# Patient Record
Sex: Female | Born: 1945 | Race: White | Hispanic: No | Marital: Married | Smoking: Never smoker
Health system: Southern US, Community
[De-identification: ages and names within clinical notes are randomized; demographics above are authoritative.]

## PROBLEM LIST (undated history)

## (undated) DIAGNOSIS — J45909 Unspecified asthma, uncomplicated: Secondary | ICD-10-CM

## (undated) HISTORY — PX: PARTIAL HYSTERECTOMY: SHX80

## (undated) HISTORY — PX: TEMPORAL ARTERY BIOPSY / LIGATION: SUR132

---

## 2004-08-09 ENCOUNTER — Emergency Department: Payer: Self-pay | Admitting: Emergency Medicine

## 2005-04-25 ENCOUNTER — Ambulatory Visit: Payer: Self-pay | Admitting: Family Medicine

## 2005-07-17 ENCOUNTER — Emergency Department: Payer: Self-pay | Admitting: General Practice

## 2005-07-17 ENCOUNTER — Other Ambulatory Visit: Payer: Self-pay

## 2005-11-15 ENCOUNTER — Emergency Department: Payer: Self-pay | Admitting: Emergency Medicine

## 2006-03-27 ENCOUNTER — Emergency Department: Payer: Self-pay | Admitting: Emergency Medicine

## 2008-02-26 ENCOUNTER — Emergency Department: Payer: Self-pay | Admitting: Unknown Physician Specialty

## 2008-08-13 ENCOUNTER — Inpatient Hospital Stay: Payer: Self-pay | Admitting: Internal Medicine

## 2009-01-22 ENCOUNTER — Ambulatory Visit: Payer: Self-pay

## 2009-01-24 ENCOUNTER — Ambulatory Visit: Payer: Self-pay

## 2009-01-24 ENCOUNTER — Observation Stay: Payer: Self-pay | Admitting: Otolaryngology

## 2009-04-22 ENCOUNTER — Ambulatory Visit: Payer: Self-pay | Admitting: Otolaryngology

## 2009-07-02 ENCOUNTER — Ambulatory Visit: Payer: Self-pay | Admitting: Family Medicine

## 2009-07-22 IMAGING — CR DG ELBOW COMPLETE 3+V*L*
1 series · 4 of 4 positions shown · non-contrast
Comparison: none

REASON FOR EXAM: pain
COMMENTS:

PROCEDURE:     DXR - DXR ELBOW LT COMP W/OBLIQUES  - August 13, 2008  [DATE]
RESULT:     No fracture, dislocation or other acute bony abnormality is
identified. No elevation of the distal humeral fat pads is seen.

[Series 1: view not recorded · 0.17mm/px · 4 of 4 slices shown]
[im 1/4]
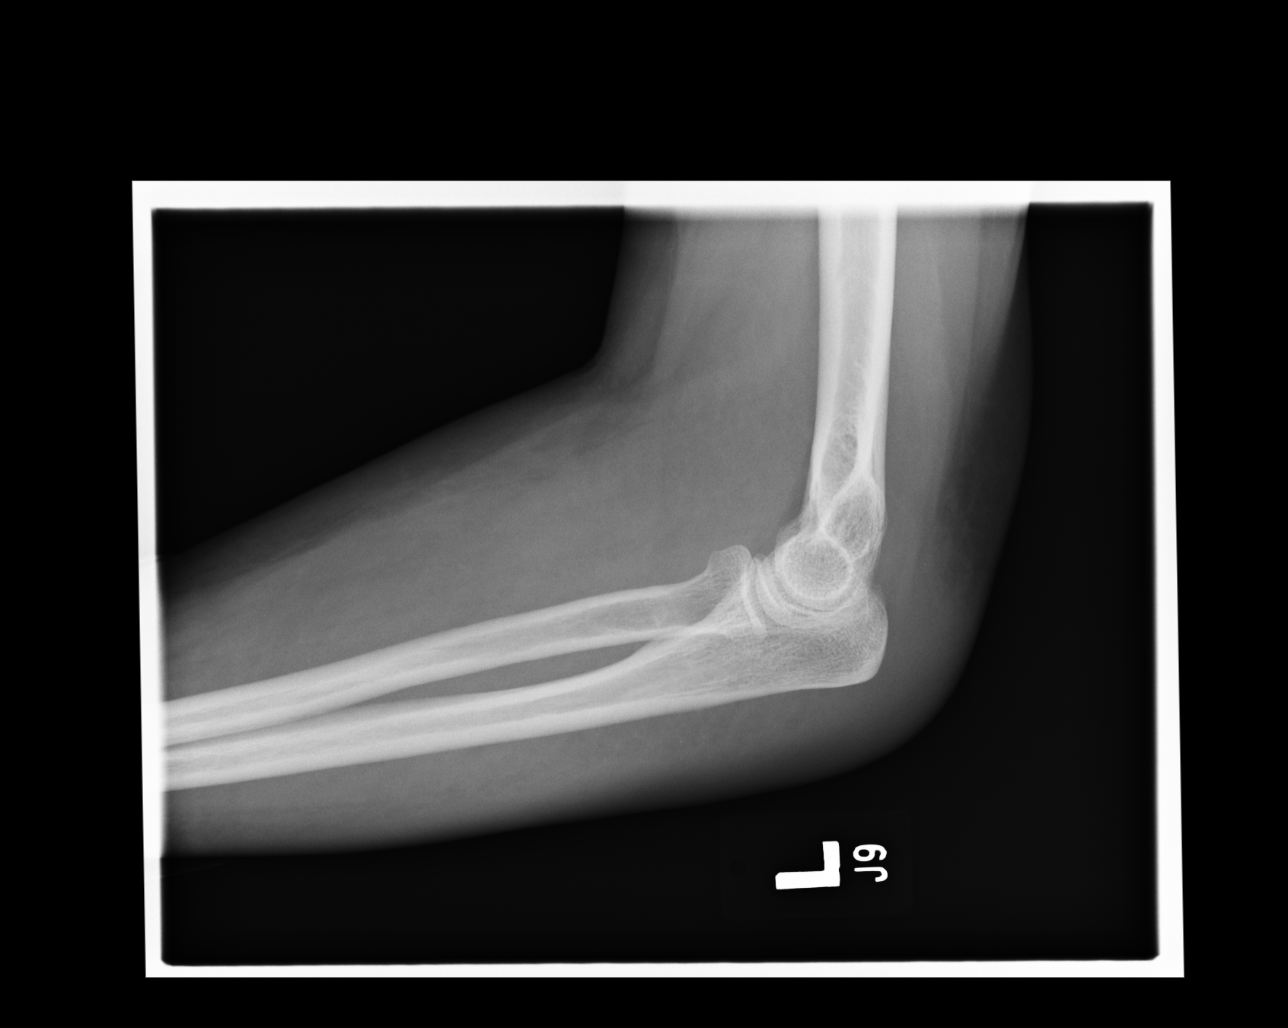
[im 2/4]
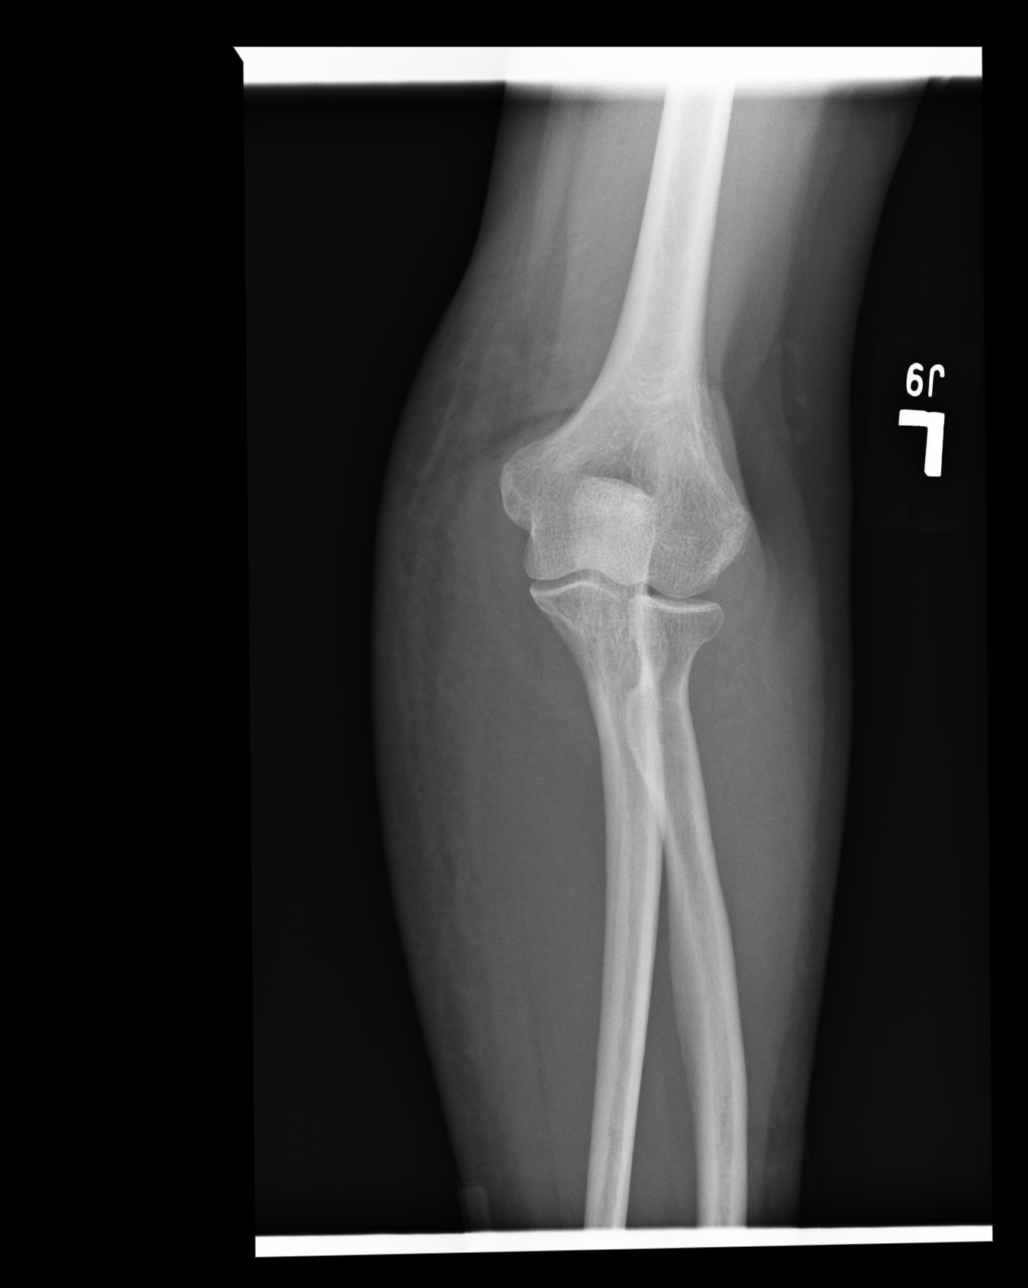
[im 3/4]
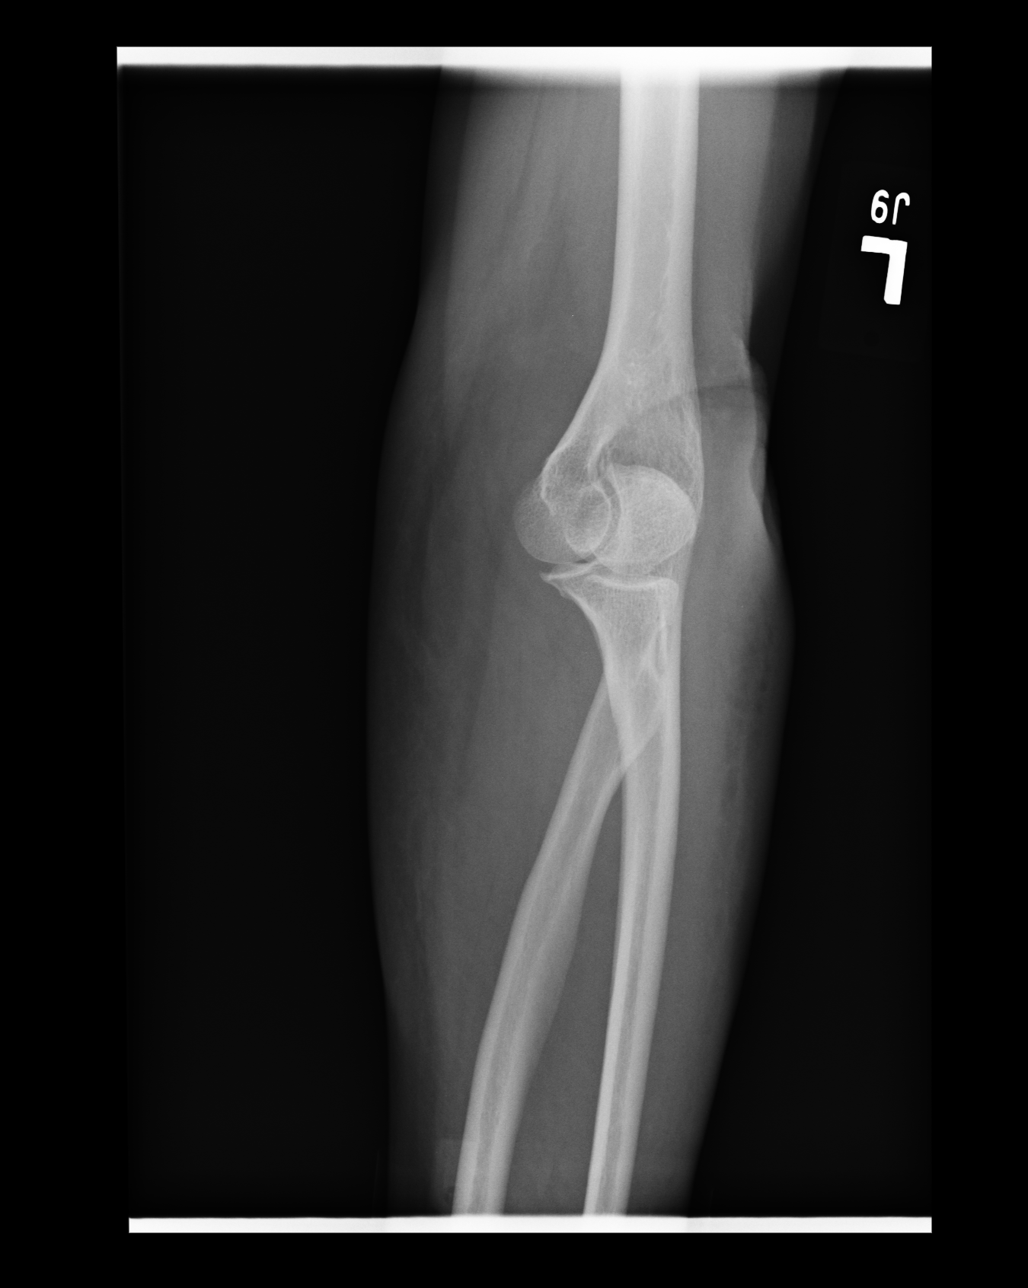
[im 4/4]
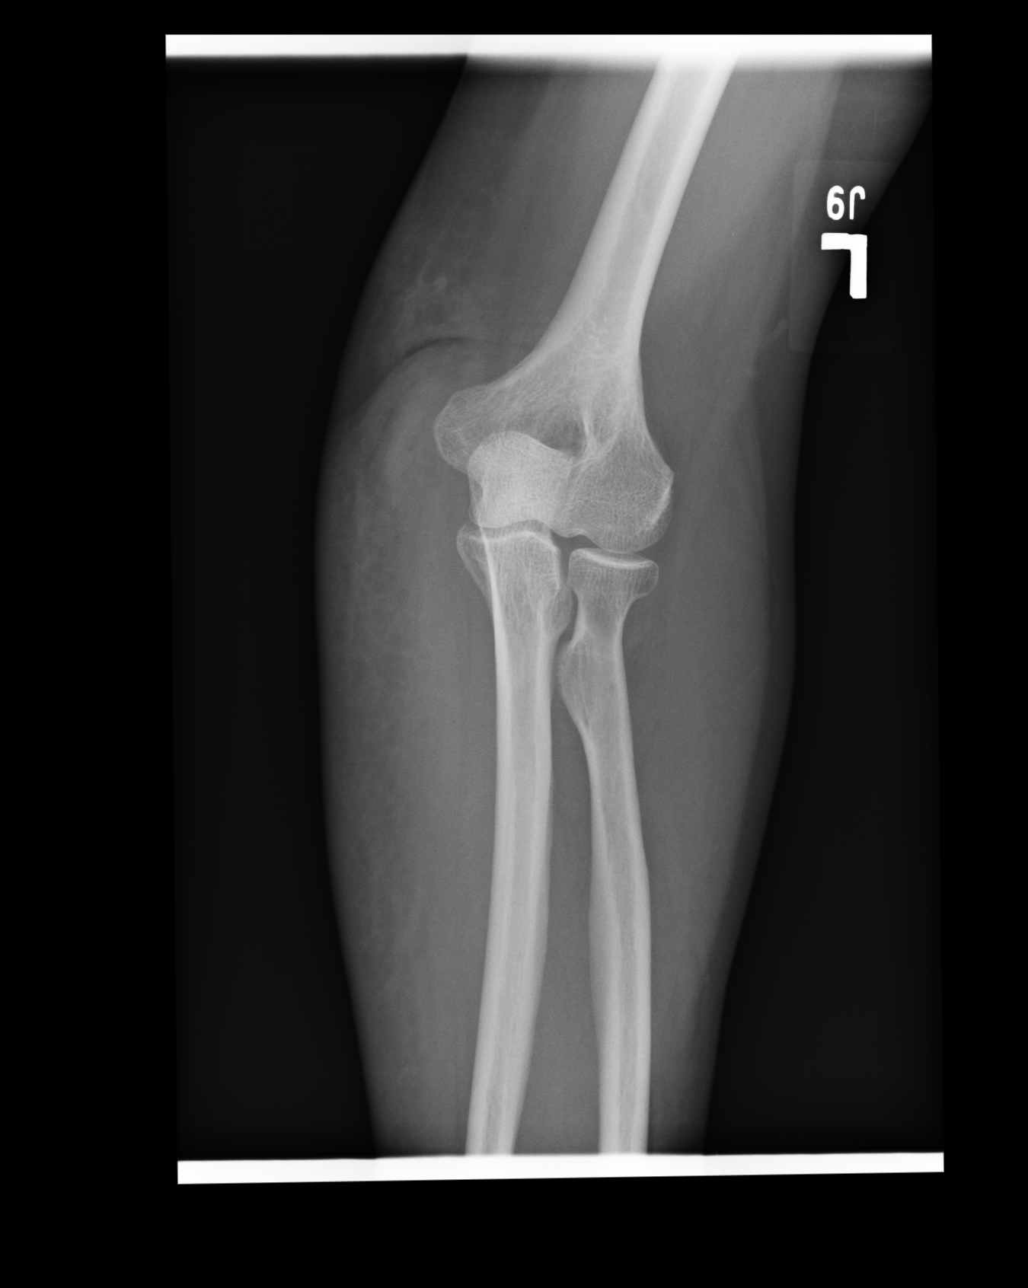

[4 of 4 positions shown; findings below may reference images not displayed]

IMPRESSION: No acute changes are identified.

## 2009-10-01 ENCOUNTER — Ambulatory Visit: Payer: Self-pay | Admitting: Family Medicine

## 2009-10-17 ENCOUNTER — Ambulatory Visit: Payer: Self-pay | Admitting: Internal Medicine

## 2009-11-22 ENCOUNTER — Ambulatory Visit: Payer: Self-pay | Admitting: Family Medicine

## 2009-12-17 ENCOUNTER — Ambulatory Visit: Payer: Self-pay | Admitting: Internal Medicine

## 2009-12-25 ENCOUNTER — Ambulatory Visit: Payer: Self-pay | Admitting: Internal Medicine

## 2010-01-13 ENCOUNTER — Ambulatory Visit: Payer: Self-pay | Admitting: Internal Medicine

## 2010-02-01 ENCOUNTER — Ambulatory Visit: Payer: Self-pay | Admitting: Internal Medicine

## 2010-06-09 ENCOUNTER — Ambulatory Visit: Payer: Self-pay | Admitting: Internal Medicine

## 2010-10-04 ENCOUNTER — Ambulatory Visit: Payer: Self-pay | Admitting: Internal Medicine

## 2010-10-14 DIAGNOSIS — J449 Chronic obstructive pulmonary disease, unspecified: Secondary | ICD-10-CM | POA: Insufficient documentation

## 2010-11-15 ENCOUNTER — Ambulatory Visit: Payer: Self-pay | Admitting: Internal Medicine

## 2011-02-02 ENCOUNTER — Ambulatory Visit: Payer: Self-pay | Admitting: Internal Medicine

## 2012-05-24 ENCOUNTER — Ambulatory Visit: Payer: Self-pay | Admitting: Internal Medicine

## 2012-08-19 ENCOUNTER — Other Ambulatory Visit: Payer: Self-pay | Admitting: Unknown Physician Specialty

## 2012-08-23 LAB — EXPECTORATED SPUTUM ASSESSMENT W GRAM STAIN, RFLX TO RESP C

## 2012-08-31 ENCOUNTER — Ambulatory Visit: Payer: Self-pay | Admitting: Specialist

## 2012-10-12 ENCOUNTER — Other Ambulatory Visit: Payer: Self-pay | Admitting: Unknown Physician Specialty

## 2012-12-28 DIAGNOSIS — F325 Major depressive disorder, single episode, in full remission: Secondary | ICD-10-CM | POA: Insufficient documentation

## 2012-12-28 DIAGNOSIS — F419 Anxiety disorder, unspecified: Secondary | ICD-10-CM | POA: Insufficient documentation

## 2013-01-04 ENCOUNTER — Other Ambulatory Visit: Payer: Self-pay | Admitting: Unknown Physician Specialty

## 2013-01-06 LAB — EXPECTORATED SPUTUM ASSESSMENT W REFEX TO RESP CULTURE

## 2013-04-14 DIAGNOSIS — J479 Bronchiectasis, uncomplicated: Secondary | ICD-10-CM | POA: Insufficient documentation

## 2013-04-19 ENCOUNTER — Other Ambulatory Visit: Payer: Self-pay | Admitting: Unknown Physician Specialty

## 2014-11-23 NOTE — Op Note (Signed)
DATE OF BIRTH:  Aug 29, 1945  DATE OF PROCEDURE:  08/31/2012  PREOPERATIVE DIAGNOSIS:  Need for extended IV antibiotics with sinusitis.   POSTOPERATIVE DIAGNOSIS:  Need for extended IV antibiotics with sinusitis.   PROCEDURES:  1. Ultrasound guidance for vascular access to right basilic vein.  2. Fluoroscopic guidance for placement of catheter.  3. Insertion of peripherally inserted central venous catheter, right arm.  SURGEON:  Annice NeedyJason S. Dew, MD  ANESTHESIA: Local.   ESTIMATED BLOOD LOSS: Minimal.   INDICATION FOR PROCEDURE:  This is a female with a need for extended IV antibiotics, and we were asked to place a PICC line by her infectious disease doctor. Risks and benefits were discussed. Informed consent was obtained.   DESCRIPTION OF PROCEDURE: The patient's right arm was sterilely prepped and draped, and a sterile surgical field was created. The right basilic vein was accessed under direct ultrasound guidance without difficulty with a micropuncture needle and permanent image was recorded. 0.018 wire was then placed into the superior vena cava. Peel-away sheath was placed over the wire. A single lumen peripherally inserted central venous catheter was then placed over the wire and the wire and peel-away sheath were removed. The catheter tip was placed into the superior vena cava and was secured at the skin at 30 cm with a sterile dressing. The catheter withdrew blood well and flushed easily with heparinized saline. The patient tolerated procedure well.   ____________________________ Annice NeedyJason S. Dew, MD jsd:ms D: 09/01/2012 19:23:23 ET T: 09/01/2012 22:56:32 ET JOB#: [id number]  cc: Annice NeedyJason S. Dew, MD, <Dictator> Annice NeedyJASON S DEW MD ELECTRONICALLY SIGNED 09/03/2012 15:40

## 2015-10-28 DIAGNOSIS — G500 Trigeminal neuralgia: Secondary | ICD-10-CM | POA: Insufficient documentation

## 2016-03-30 ENCOUNTER — Emergency Department: Payer: Medicare Other

## 2016-03-30 ENCOUNTER — Inpatient Hospital Stay
Admission: EM | Admit: 2016-03-30 | Discharge: 2016-04-02 | DRG: 871 | Disposition: A | Discharge status: Home / Self Care | Payer: Medicare Other | Attending: Internal Medicine | Admitting: Internal Medicine

## 2016-03-30 ENCOUNTER — Encounter: Payer: Self-pay | Admitting: Emergency Medicine

## 2016-03-30 DIAGNOSIS — Z8673 Personal history of transient ischemic attack (TIA), and cerebral infarction without residual deficits: Secondary | ICD-10-CM

## 2016-03-30 DIAGNOSIS — R51 Headache: Secondary | ICD-10-CM | POA: Diagnosis present

## 2016-03-30 DIAGNOSIS — M316 Other giant cell arteritis: Secondary | ICD-10-CM | POA: Diagnosis present

## 2016-03-30 DIAGNOSIS — Z800 Family history of malignant neoplasm of digestive organs: Secondary | ICD-10-CM

## 2016-03-30 DIAGNOSIS — R0682 Tachypnea, not elsewhere classified: Secondary | ICD-10-CM | POA: Diagnosis present

## 2016-03-30 DIAGNOSIS — A419 Sepsis, unspecified organism: Secondary | ICD-10-CM

## 2016-03-30 DIAGNOSIS — H538 Other visual disturbances: Secondary | ICD-10-CM

## 2016-03-30 DIAGNOSIS — Z90711 Acquired absence of uterus with remaining cervical stump: Secondary | ICD-10-CM

## 2016-03-30 DIAGNOSIS — J45909 Unspecified asthma, uncomplicated: Secondary | ICD-10-CM | POA: Diagnosis present

## 2016-03-30 DIAGNOSIS — F329 Major depressive disorder, single episode, unspecified: Secondary | ICD-10-CM | POA: Diagnosis present

## 2016-03-30 DIAGNOSIS — R000 Tachycardia, unspecified: Secondary | ICD-10-CM | POA: Diagnosis not present

## 2016-03-30 DIAGNOSIS — R6520 Severe sepsis without septic shock: Secondary | ICD-10-CM

## 2016-03-30 DIAGNOSIS — I10 Essential (primary) hypertension: Secondary | ICD-10-CM | POA: Diagnosis present

## 2016-03-30 DIAGNOSIS — Z882 Allergy status to sulfonamides status: Secondary | ICD-10-CM

## 2016-03-30 DIAGNOSIS — Z79899 Other long term (current) drug therapy: Secondary | ICD-10-CM

## 2016-03-30 DIAGNOSIS — Z8249 Family history of ischemic heart disease and other diseases of the circulatory system: Secondary | ICD-10-CM

## 2016-03-30 DIAGNOSIS — A4151 Sepsis due to Escherichia coli [E. coli]: Principal | ICD-10-CM | POA: Diagnosis present

## 2016-03-30 DIAGNOSIS — N390 Urinary tract infection, site not specified: Secondary | ICD-10-CM | POA: Diagnosis present

## 2016-03-30 DIAGNOSIS — G9340 Encephalopathy, unspecified: Secondary | ICD-10-CM

## 2016-03-30 DIAGNOSIS — Z9889 Other specified postprocedural states: Secondary | ICD-10-CM

## 2016-03-30 HISTORY — DX: Unspecified asthma, uncomplicated: J45.909

## 2016-03-30 LAB — URINALYSIS COMPLETE WITH MICROSCOPIC (ARMC ONLY)
BILIRUBIN URINE: NEGATIVE
Bacteria, UA: NONE SEEN
GLUCOSE, UA: NEGATIVE mg/dL
KETONES UR: NEGATIVE mg/dL
Nitrite: POSITIVE — AB
PROTEIN: 30 mg/dL — AB
SPECIFIC GRAVITY, URINE: 1.01 (ref 1.005–1.030)
SQUAMOUS EPITHELIAL / LPF: NONE SEEN
pH: 6 (ref 5.0–8.0)

## 2016-03-30 LAB — CBC WITH DIFFERENTIAL/PLATELET
BASOS ABS: 0 10*3/uL (ref 0–0.1)
BASOS PCT: 0 %
EOS PCT: 1 %
Eosinophils Absolute: 0.1 10*3/uL (ref 0–0.7)
HCT: 38.2 % (ref 35.0–47.0)
Hemoglobin: 13.1 g/dL (ref 12.0–16.0)
Lymphocytes Relative: 4 %
Lymphs Abs: 0.3 10*3/uL — ABNORMAL LOW (ref 1.0–3.6)
MCH: 30.5 pg (ref 26.0–34.0)
MCHC: 34.4 g/dL (ref 32.0–36.0)
MCV: 88.5 fL (ref 80.0–100.0)
MONO ABS: 0.8 10*3/uL (ref 0.2–0.9)
Monocytes Relative: 9 %
Neutro Abs: 7.5 10*3/uL — ABNORMAL HIGH (ref 1.4–6.5)
Neutrophils Relative %: 86 %
PLATELETS: 249 10*3/uL (ref 150–440)
RBC: 4.31 MIL/uL (ref 3.80–5.20)
RDW: 14 % (ref 11.5–14.5)
WBC: 8.7 10*3/uL (ref 3.6–11.0)

## 2016-03-30 LAB — COMPREHENSIVE METABOLIC PANEL
ALT: 13 U/L — AB (ref 14–54)
AST: 22 U/L (ref 15–41)
Albumin: 3.9 g/dL (ref 3.5–5.0)
Alkaline Phosphatase: 79 U/L (ref 38–126)
Anion gap: 8 (ref 5–15)
BILIRUBIN TOTAL: 0.4 mg/dL (ref 0.3–1.2)
BUN: 10 mg/dL (ref 6–20)
CALCIUM: 8.9 mg/dL (ref 8.9–10.3)
CHLORIDE: 98 mmol/L — AB (ref 101–111)
CO2: 28 mmol/L (ref 22–32)
CREATININE: 0.81 mg/dL (ref 0.44–1.00)
Glucose, Bld: 135 mg/dL — ABNORMAL HIGH (ref 65–99)
Potassium: 4 mmol/L (ref 3.5–5.1)
Sodium: 134 mmol/L — ABNORMAL LOW (ref 135–145)
TOTAL PROTEIN: 7.4 g/dL (ref 6.5–8.1)

## 2016-03-30 LAB — LACTIC ACID, PLASMA: LACTIC ACID, VENOUS: 1.4 mmol/L (ref 0.5–1.9)

## 2016-03-30 MED ORDER — DEXTROSE 5 % IV SOLN
2.0000 g | Freq: Once | INTRAVENOUS | Status: DC
  Filled 2016-03-30: qty 2

## 2016-03-30 MED ORDER — IPRATROPIUM-ALBUTEROL 0.5-2.5 (3) MG/3ML IN SOLN
3.0000 mL | Freq: Once | RESPIRATORY_TRACT | Status: AC
  Administered 2016-03-30: 3 mL via RESPIRATORY_TRACT
  Filled 2016-03-30: qty 3

## 2016-03-30 MED ORDER — ACETAMINOPHEN 500 MG PO TABS
1000.0000 mg | ORAL_TABLET | Freq: Once | ORAL | Status: AC
  Administered 2016-03-30: 1000 mg via ORAL

## 2016-03-30 MED ORDER — DEXTROSE 5 % IV SOLN
1.0000 g | INTRAVENOUS | Status: DC
  Administered 2016-03-30: 1 g via INTRAVENOUS
  Filled 2016-03-30: qty 10

## 2016-03-30 MED ORDER — ACETAMINOPHEN 500 MG PO TABS
ORAL_TABLET | ORAL | Status: AC
  Administered 2016-03-30: 1000 mg via ORAL
  Filled 2016-03-30: qty 2

## 2016-03-30 MED ORDER — SODIUM CHLORIDE 0.9 % IV BOLUS (SEPSIS)
1000.0000 mL | Freq: Once | INTRAVENOUS | Status: AC
  Administered 2016-03-30: 1000 mL via INTRAVENOUS

## 2016-03-30 NOTE — Progress Notes (Signed)
ANTIBIOTIC CONSULT NOTE - INITIAL  Pharmacy Consult for Ceftriaxone  Indication: UTI  Allergies  Allergen Reactions  . Sulfa Antibiotics     Patient Measurements: Height: 5\' 2"  (157.5 cm) Weight: 137 lb (62.1 kg) IBW/kg (Calculated) : 50.1 Adjusted Body Weight:   Vital Signs: Temp: 102.6 F (39.2 C) (08/28 2122) Temp Source: Oral (08/28 2122) BP: 142/94 (08/28 2107) Pulse Rate: 108 (08/28 2107) Intake/Output from previous day: No intake/output data recorded. Intake/Output from this shift: No intake/output data recorded.  Labs: No results for input(s): WBC, HGB, PLT, LABCREA, CREATININE in the last 72 hours. CrCl cannot be calculated (No order found.). No results for input(s): VANCOTROUGH, VANCOPEAK, VANCORANDOM, GENTTROUGH, GENTPEAK, GENTRANDOM, TOBRATROUGH, TOBRAPEAK, TOBRARND, AMIKACINPEAK, AMIKACINTROU, AMIKACIN in the last 72 hours.   Microbiology: No results found for this or any previous visit (from the past 720 hour(s)).  Medical History: No past medical history on file.  Medications:  Scheduled:   Assessment:   Goal of Therapy:  resolution of infection  Plan:  Expected duration 7 days with resolution of temperature and/or normalization of WBC   Ceftriaxone 1 gm IV Q24H ordered to start on 8/28.   Hady Niemczyk D 03/30/2016,9:28 PM

## 2016-03-30 NOTE — ED Notes (Signed)
Code SEPSIS called to [facility] 

## 2016-03-30 NOTE — ED Provider Notes (Addendum)
[facility] Emergency Department Provider Note    First MD Initiated Contact with Patient 03/30/16 2101     (approximate)  I have reviewed the triage vital signs and the nursing notes.   HISTORY  Chief Complaint Altered Mental Status    HPI Brooke [name] is a 70 y.o. female presents with acute altered mental status started this evening. Patient has been having dysuria and foul odor for the past 4 days. Was seen by her PCP today and discharged with Macrobid. Took one of these medications this evening and symptoms did not improve. Patient went to sleep and awoke encephalopathic. 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 Family could not oriented her and at that point given her encephalopathy the patient was brought to the ER for further evaluation and management.  On arrival to the ER the patient is alert and oriented but slow to respond and does appear confused. Patient febrile and tachycardic. Denies any chest pain. Denies any numbness or tingling. No neck pain. No nausea or vomiting.   PMH: no h/o CHF  There are no active problems to display for this patient.   No recent surgerie  Prior to Admission medications   Not on File    Allergies Sulfa antibiotics  FMH: no bleeding disorders  Social History Social History  Substance Use Topics  . Smoking status: Not on file  . Smokeless tobacco: Not on file  . Alcohol use Not on file    Review of Systems Patient denies headaches, rhinorrhea, blurry vision, numbness, shortness of breath, chest pain, edema, cough, abdominal pain, nausea, vomiting, diarrhea, dysuria, fevers, rashes or hallucinations unless otherwise stated above in HPI. ____________________________________________   PHYSICAL EXAM:  VITAL SIGNS: Vitals:   03/30/16 2300 03/30/16 2333  BP: 109/75 118/73  Pulse: 96 97  Resp: (!) 22 18  Temp:  99.6 F (37.6 C)    Constitutional: Alert and oriented.  Critically ill appearing  Eyes: Conjunctivae are normal. PERRL. EOMI. Head: Atraumatic. Nose: No congestion/rhinnorhea. Mouth/Throat: Mucous membranes are dry.  Oropharynx non-erythematous. Neck: No stridor. Painless ROM. No cervical spine tenderness to palpation Hematological/Lymphatic/Immunilogical: No cervical lymphadenopathy. Cardiovascular: Tachycardic, regular rhythm. Grossly normal heart sounds.  Refill 3 seconds Respiratory: Moderate respiratory distress with tachypnea  No retractions. Lungs CTAB. Gastrointestinal: Soft and nontender. No distention. No abdominal bruits. Bilateral CVA ttp Genitourinary:  Musculoskeletal: No lower extremity tenderness nor edema.  No joint effusions. Neurologic:  Normal speech and language. No gross focal neurologic deficits are appreciated. No facial droop Skin:  Skin is warm, dry and intact. No rash noted.  ____________________________________________   LABS (all labs ordered are listed, but only abnormal results are displayed)  Results for orders placed or performed during the hospital encounter of 03/30/16 (from the past 24 hour(s))  CBC with Differential/Platelet     Status: Abnormal   Collection Time: 03/30/16  9:03 PM  Result Value Ref Range   WBC 8.7 3.6 - 11.0 K/uL   RBC 4.31 3.80 - 5.20 MIL/uL   Hemoglobin 13.1 12.0 - 16.0 g/dL   HCT 16.1 09.6 - 04.5 %   MCV 88.5 80.0 - 100.0 fL   MCH 30.5 26.0 - 34.0 pg   MCHC 34.4 32.0 - 36.0 g/dL   RDW 40.9 81.1 - 91.4 %   Platelets 249 150 - 440 K/uL   Neutrophils Relative % 86 %   Neutro Abs 7.5 (H) 1.4 - 6.5 K/uL   Lymphocytes Relative 4 %  Lymphs Abs 0.3 (L) 1.0 - 3.6 K/uL   Monocytes Relative 9 %   Monocytes Absolute 0.8 0.2 - 0.9 K/uL   Eosinophils Relative 1 %   Eosinophils Absolute 0.1 0 - 0.7 K/uL   Basophils Relative 0 %   Basophils Absolute 0.0 0 - 0.1 K/uL  Comprehensive metabolic panel     Status: Abnormal   Collection Time: 03/30/16  9:03 PM  Result Value Ref Range    Sodium 134 (L) 135 - 145 mmol/L   Potassium 4.0 3.5 - 5.1 mmol/L   Chloride 98 (L) 101 - 111 mmol/L   CO2 28 22 - 32 mmol/L   Glucose, Bld 135 (H) 65 - 99 mg/dL   BUN 10 6 - 20 mg/dL   Creatinine, Ser 0.98 0.44 - 1.00 mg/dL   Calcium 8.9 8.9 - 11.9 mg/dL   Total Protein 7.4 6.5 - 8.1 g/dL   Albumin 3.9 3.5 - 5.0 g/dL   AST 22 15 - 41 U/L   ALT 13 (L) 14 - 54 U/L   Alkaline Phosphatase 79 38 - 126 U/L   Total Bilirubin 0.4 0.3 - 1.2 mg/dL   GFR calc non Af Amer >60 >60 mL/min   GFR calc Af Amer >60 >60 mL/min   Anion gap 8 5 - 15  Lactic acid, plasma     Status: None   Collection Time: 03/30/16  9:07 PM  Result Value Ref Range   Lactic Acid, Venous 1.4 0.5 - 1.9 mmol/L  Urinalysis complete, with microscopic ([facility] only)     Status: Abnormal   Collection Time: 03/30/16  9:53 PM  Result Value Ref Range   Color, Urine AMBER (A) YELLOW   APPearance CLEAR (A) CLEAR   Glucose, UA NEGATIVE NEGATIVE mg/dL   Bilirubin Urine NEGATIVE NEGATIVE   Ketones, ur NEGATIVE NEGATIVE mg/dL   Specific Gravity, Urine 1.010 1.005 - 1.030   Hgb urine dipstick 1+ (A) NEGATIVE   pH 6.0 5.0 - 8.0   Protein, ur 30 (A) NEGATIVE mg/dL   Nitrite POSITIVE (A) NEGATIVE   Leukocytes, UA 2+ (A) NEGATIVE   RBC / HPF 6-30 0 - 5 RBC/hpf   WBC, UA TOO NUMEROUS TO COUNT 0 - 5 WBC/hpf   Bacteria, UA NONE SEEN NONE SEEN   Squamous Epithelial / LPF NONE SEEN NONE SEEN   ____________________________________________  EKG My review and personal interpretation at Time: 22:16   Indication: sepsis  Rate: 100  Rhythm: nsr Axis: normal Other: non specific st changes, no acute ischemia ____________________________________________  RADIOLOGY  CXR my read shows no evidence of acute cardiopulmonary process.  ____________________________________________   PROCEDURES  Procedure(s) performed: none    Critical Care performed: yes CRITICAL CARE Performed by: Willy Eddy   Total critical care time: 42  minutes  Critical care time was exclusive of separately billable procedures and treating other patients.  Critical care was necessary to treat or prevent imminent or life-threatening deterioration.  Critical care was time spent personally by me on the following activities: development of treatment plan with patient and/or surrogate as well as nursing, discussions with consultants, evaluation of patient's response to treatment, examination of patient, obtaining history from patient or surrogate, ordering and performing treatments and interventions, ordering and review of laboratory studies, ordering and review of radiographic studies, pulse oximetry and re-evaluation of patient's condition.  ____________________________________________   INITIAL IMPRESSION / ASSESSMENT AND PLAN / ED COURSE  Pertinent labs & imaging results that were available during my care  of the patient were reviewed by me and considered in my medical decision making (see chart for details).  DDX: Dehydration, sepsis, pna, uti, hypoglycemia, cva, drug effect, withdrawal, encephalitis   Brooke [name] is a 70 y.o. who presents to the ED with acute encephalopathy, fever and tachycardia after recently diagnosed with UTI and sent home on Macrobid. Based on her age and poor metabolization in elderly patients I'm suspicious for acute severe sepsis secondary to urinary tract infection. Her presentation is not consistent with CVA or infectious encephalopathy such as meningitis or encephalitis that she has no meningeal signs.  Have initiated the sepsis order set and ordered antibiotics. We'll order IV fluid resuscitation for sepsis.  The patient will be placed on continuous pulse oximetry and telemetry for monitoring.  Laboratory evaluation will be sent to evaluate for the above complaints.     Clinical Course  Comment By Time  Patient was with urinalysis consistent with acute UTI. Rocephin has been administered. Patient completing  her second liter of IV fluids with improvement in her mental status and tachycardia. Patient does state she feels that she is wheezing and has a history of asthma. She does not have any acute hypoxia but will order a DuoNeb for wheezing. Willy EddyPatrick Fredi Geiler, MD 08/28 2243   ----------------------------------------- 11:35 PM on 03/30/2016 -----------------------------------------  Patient's encephalopathy improving. Antibiotics on board and patient symmetrically improved.  Based on her severe sepsis upon arrival patient will be admitted to the hospital for further evaluation and monitoring.  I spoke with Dr. Anne HahnWillis who agrees to admit patient for further evaluation and monitoring.  Have discussed with the patient and available family all diagnostics and treatments performed thus far and all questions were answered to the best of my ability. The patient demonstrates understanding and agreement with plan.   ____________________________________________   FINAL CLINICAL IMPRESSION(S) / ED DIAGNOSES  Final diagnoses:  Severe sepsis with acute organ dysfunction (HCC)  UTI (lower urinary tract infection)  Tachycardia  Acute encephalopathy      NEW MEDICATIONS STARTED DURING THIS VISIT:  New Prescriptions   No medications on file     Note:  This document was prepared using Dragon voice recognition software and may include unintentional dictation errors.    Willy EddyPatrick Maaliyah Adolph, MD 03/30/16 [id number]    Willy EddyPatrick Raghad Lorenz, MD 03/30/16 [id number]

## 2016-03-30 NOTE — ED Triage Notes (Signed)
Pt arrived by EMS from home with AMS. EMS reports pt was seen at [facility] this AM, diagnosed with UTI, given Macrobid. Pt took Macrobid and an anxiety pill, afterwards pts daughter states pt became altered. Upon arrival to ED pt is A&O to place and person only. Pt not able to stay focused, pt is able to complete sentences.

## 2016-03-31 ENCOUNTER — Inpatient Hospital Stay: Payer: Medicare Other

## 2016-03-31 ENCOUNTER — Encounter: Payer: Self-pay | Admitting: Internal Medicine

## 2016-03-31 DIAGNOSIS — Z90711 Acquired absence of uterus with remaining cervical stump: Secondary | ICD-10-CM | POA: Diagnosis not present

## 2016-03-31 DIAGNOSIS — Z882 Allergy status to sulfonamides status: Secondary | ICD-10-CM | POA: Diagnosis not present

## 2016-03-31 DIAGNOSIS — Z8673 Personal history of transient ischemic attack (TIA), and cerebral infarction without residual deficits: Secondary | ICD-10-CM | POA: Diagnosis not present

## 2016-03-31 DIAGNOSIS — R000 Tachycardia, unspecified: Secondary | ICD-10-CM | POA: Diagnosis present

## 2016-03-31 DIAGNOSIS — M316 Other giant cell arteritis: Secondary | ICD-10-CM | POA: Diagnosis present

## 2016-03-31 DIAGNOSIS — I10 Essential (primary) hypertension: Secondary | ICD-10-CM | POA: Diagnosis present

## 2016-03-31 DIAGNOSIS — N390 Urinary tract infection, site not specified: Secondary | ICD-10-CM | POA: Diagnosis present

## 2016-03-31 DIAGNOSIS — R51 Headache: Secondary | ICD-10-CM | POA: Diagnosis present

## 2016-03-31 DIAGNOSIS — Z79899 Other long term (current) drug therapy: Secondary | ICD-10-CM | POA: Diagnosis not present

## 2016-03-31 DIAGNOSIS — A4151 Sepsis due to Escherichia coli [E. coli]: Secondary | ICD-10-CM | POA: Diagnosis present

## 2016-03-31 DIAGNOSIS — J45909 Unspecified asthma, uncomplicated: Secondary | ICD-10-CM | POA: Diagnosis present

## 2016-03-31 DIAGNOSIS — R0682 Tachypnea, not elsewhere classified: Secondary | ICD-10-CM | POA: Diagnosis present

## 2016-03-31 DIAGNOSIS — Z9889 Other specified postprocedural states: Secondary | ICD-10-CM | POA: Diagnosis not present

## 2016-03-31 DIAGNOSIS — Z8249 Family history of ischemic heart disease and other diseases of the circulatory system: Secondary | ICD-10-CM | POA: Diagnosis not present

## 2016-03-31 DIAGNOSIS — Z800 Family history of malignant neoplasm of digestive organs: Secondary | ICD-10-CM | POA: Diagnosis not present

## 2016-03-31 DIAGNOSIS — G9340 Encephalopathy, unspecified: Secondary | ICD-10-CM | POA: Diagnosis present

## 2016-03-31 DIAGNOSIS — F329 Major depressive disorder, single episode, unspecified: Secondary | ICD-10-CM | POA: Diagnosis present

## 2016-03-31 LAB — BLOOD CULTURE ID PANEL (REFLEXED)
Acinetobacter baumannii: NOT DETECTED
CANDIDA ALBICANS: NOT DETECTED
CANDIDA GLABRATA: NOT DETECTED
CANDIDA PARAPSILOSIS: NOT DETECTED
CANDIDA TROPICALIS: NOT DETECTED
Candida krusei: NOT DETECTED
Carbapenem resistance: NOT DETECTED
ENTEROBACTER CLOACAE COMPLEX: NOT DETECTED
ENTEROBACTERIACEAE SPECIES: DETECTED — AB
ESCHERICHIA COLI: DETECTED — AB
Enterococcus species: NOT DETECTED
HAEMOPHILUS INFLUENZAE: NOT DETECTED
KLEBSIELLA PNEUMONIAE: NOT DETECTED
Klebsiella oxytoca: NOT DETECTED
Listeria monocytogenes: NOT DETECTED
Neisseria meningitidis: NOT DETECTED
PROTEUS SPECIES: NOT DETECTED
PSEUDOMONAS AERUGINOSA: NOT DETECTED
STREPTOCOCCUS AGALACTIAE: NOT DETECTED
STREPTOCOCCUS SPECIES: NOT DETECTED
Serratia marcescens: NOT DETECTED
Staphylococcus aureus (BCID): NOT DETECTED
Staphylococcus species: NOT DETECTED
Streptococcus pneumoniae: NOT DETECTED
Streptococcus pyogenes: NOT DETECTED

## 2016-03-31 LAB — LACTIC ACID, PLASMA: Lactic Acid, Venous: 1 mmol/L (ref 0.5–1.9)

## 2016-03-31 LAB — HEMOGLOBIN A1C: Hgb A1c MFr Bld: 5.4 % (ref 4.0–6.0)

## 2016-03-31 LAB — TSH: TSH: 0.785 u[IU]/mL (ref 0.350–4.500)

## 2016-03-31 MED ORDER — METOCLOPRAMIDE HCL 10 MG PO TABS
10.0000 mg | ORAL_TABLET | Freq: Every evening | ORAL | Status: DC | PRN
  Filled 2016-03-31: qty 1

## 2016-03-31 MED ORDER — MECLIZINE HCL 25 MG PO TABS: 25.0000 mg | ORAL_TABLET | Freq: Three times a day (TID) | ORAL | Status: DC | PRN

## 2016-03-31 MED ORDER — FLUTICASONE PROPIONATE 50 MCG/ACT NA SUSP
1.0000 | Freq: Every day | NASAL | Status: DC
  Administered 2016-03-31 – 2016-04-02 (×3): 1 via NASAL
  Filled 2016-03-31: qty 16

## 2016-03-31 MED ORDER — ONDANSETRON HCL 4 MG PO TABS
4.0000 mg | ORAL_TABLET | Freq: Four times a day (QID) | ORAL | Status: DC | PRN
  Administered 2016-04-01 – 2016-04-02 (×2): 4 mg via ORAL
  Filled 2016-03-31 (×2): qty 1

## 2016-03-31 MED ORDER — MONTELUKAST SODIUM 10 MG PO TABS
10.0000 mg | ORAL_TABLET | Freq: Every day | ORAL | Status: DC
  Administered 2016-03-31 – 2016-04-01 (×2): 10 mg via ORAL
  Filled 2016-03-31 (×2): qty 1

## 2016-03-31 MED ORDER — ENOXAPARIN SODIUM 40 MG/0.4ML ~~LOC~~ SOLN
40.0000 mg | Freq: Every day | SUBCUTANEOUS | Status: DC
  Administered 2016-03-31 – 2016-04-01 (×2): 40 mg via SUBCUTANEOUS
  Filled 2016-03-31 (×2): qty 0.4

## 2016-03-31 MED ORDER — LOSARTAN POTASSIUM 50 MG PO TABS
50.0000 mg | ORAL_TABLET | Freq: Every day | ORAL | Status: DC
  Administered 2016-03-31: 09:00:00 50 mg via ORAL
  Filled 2016-03-31: qty 1

## 2016-03-31 MED ORDER — HYDROCODONE-ACETAMINOPHEN 5-325 MG PO TABS
1.0000 | ORAL_TABLET | Freq: Three times a day (TID) | ORAL | Status: DC | PRN
  Administered 2016-03-31 – 2016-04-01 (×2): 1 via ORAL
  Filled 2016-03-31 (×2): qty 1

## 2016-03-31 MED ORDER — MOMETASONE FURO-FORMOTEROL FUM 100-5 MCG/ACT IN AERO
2.0000 | INHALATION_SPRAY | Freq: Two times a day (BID) | RESPIRATORY_TRACT | Status: DC
  Administered 2016-03-31 – 2016-04-02 (×5): 2 via RESPIRATORY_TRACT
  Filled 2016-03-31: qty 8.8

## 2016-03-31 MED ORDER — CLONAZEPAM 0.5 MG PO TABS
0.5000 mg | ORAL_TABLET | Freq: Two times a day (BID) | ORAL | Status: DC | PRN
  Administered 2016-03-31: 0.5 mg via ORAL
  Filled 2016-03-31: qty 1

## 2016-03-31 MED ORDER — BUTALBITAL-APAP-CAFFEINE 50-325-40 MG PO TABS
1.0000 | ORAL_TABLET | Freq: Four times a day (QID) | ORAL | Status: DC | PRN
  Administered 2016-03-31 – 2016-04-01 (×3): 1 via ORAL
  Filled 2016-03-31 (×4): qty 1

## 2016-03-31 MED ORDER — ACETAMINOPHEN 325 MG PO TABS: 650.0000 mg | ORAL_TABLET | Freq: Four times a day (QID) | ORAL | Status: DC | PRN

## 2016-03-31 MED ORDER — FLUOXETINE HCL 20 MG PO CAPS
40.0000 mg | ORAL_CAPSULE | Freq: Every day | ORAL | Status: DC
Start: 2016-03-31 — End: 2016-04-02
  Administered 2016-03-31 – 2016-04-02 (×3): 40 mg via ORAL
  Filled 2016-03-31 (×3): qty 2

## 2016-03-31 MED ORDER — IPRATROPIUM-ALBUTEROL 0.5-2.5 (3) MG/3ML IN SOLN: 3.0000 mL | RESPIRATORY_TRACT | Status: DC | PRN

## 2016-03-31 MED ORDER — ACETAMINOPHEN 650 MG RE SUPP: 650.0000 mg | Freq: Four times a day (QID) | RECTAL | Status: DC | PRN

## 2016-03-31 MED ORDER — ONDANSETRON HCL 4 MG/2ML IJ SOLN: 4.0000 mg | Freq: Four times a day (QID) | INTRAMUSCULAR | Status: DC | PRN

## 2016-03-31 MED ORDER — DOCUSATE SODIUM 100 MG PO CAPS
100.0000 mg | ORAL_CAPSULE | Freq: Two times a day (BID) | ORAL | Status: DC
  Administered 2016-03-31: 09:00:00 100 mg via ORAL
  Filled 2016-03-31 (×2): qty 1

## 2016-03-31 MED ORDER — FERROUS SULFATE 325 (65 FE) MG PO TABS
325.0000 mg | ORAL_TABLET | Freq: Three times a day (TID) | ORAL | Status: DC
  Administered 2016-03-31 – 2016-04-01 (×6): 325 mg via ORAL
  Filled 2016-03-31 (×7): qty 1

## 2016-03-31 MED ORDER — TRAZODONE HCL 100 MG PO TABS
100.0000 mg | ORAL_TABLET | Freq: Every day | ORAL | Status: DC
  Administered 2016-03-31 – 2016-04-01 (×2): 100 mg via ORAL
  Filled 2016-03-31 (×2): qty 1

## 2016-03-31 MED ORDER — DESIPRAMINE HCL 25 MG PO TABS
37.5000 mg | ORAL_TABLET | Freq: Every day | ORAL | Status: DC
Start: 2016-03-31 — End: 2016-04-02
  Administered 2016-03-31 – 2016-04-01 (×2): 37.5 mg via ORAL
  Filled 2016-03-31 (×2): qty 2

## 2016-03-31 MED ORDER — MEROPENEM 1 G IV SOLR
2.0000 g | Freq: Three times a day (TID) | INTRAVENOUS | Status: DC
  Administered 2016-03-31 – 2016-04-01 (×3): 2 g via INTRAVENOUS
  Filled 2016-03-31 (×5): qty 2

## 2016-03-31 MED ORDER — BUTALBITAL-APAP-CAFFEINE 50-325-40 MG PO TABS: 1.0000 | ORAL_TABLET | Freq: Two times a day (BID) | ORAL | Status: DC | PRN

## 2016-03-31 MED ORDER — DEXTROSE 5 % IV SOLN: 1.0000 g | INTRAVENOUS | Status: DC

## 2016-03-31 MED ORDER — SODIUM CHLORIDE 0.9 % IV SOLN
INTRAVENOUS | Status: DC
  Administered 2016-03-31 – 2016-04-02 (×4): via INTRAVENOUS

## 2016-03-31 MED ORDER — GABAPENTIN 300 MG PO CAPS
300.0000 mg | ORAL_CAPSULE | Freq: Three times a day (TID) | ORAL | Status: DC
  Administered 2016-03-31 – 2016-04-02 (×7): 300 mg via ORAL
  Filled 2016-03-31 (×7): qty 1

## 2016-03-31 MED ORDER — PANTOPRAZOLE SODIUM 40 MG PO TBEC
40.0000 mg | DELAYED_RELEASE_TABLET | Freq: Every day | ORAL | Status: DC
  Administered 2016-03-31 – 2016-04-02 (×3): 40 mg via ORAL
  Filled 2016-03-31 (×3): qty 1

## 2016-03-31 NOTE — H&P (Signed)
Brooke [name] is an 70 y.o. female.   Chief Complaint: Altered mental status HPI: The patient with past medical history of asthma presents to the emergency department due to confusion and agitation. The patient states that she has had urinary urgency, chills and abdominal pain for the last 4 days but could not be evaluated by her primary care doctor. She was seen in the emergency department at [facility] and given oral antibiotics of which she took 1 dose prior to her family noticing that the patient was hallucinating. Operatory evaluation here showed urinary tract infection for which the patient received IV antibiotics. Her fever defervesced and her mental status improved. However due to meeting criteria for sepsis the emergency department staff called the hospitalist service for admission.  Past Medical History:  Diagnosis Date  . Asthma     Past Surgical History:  Procedure Laterality Date  . PARTIAL HYSTERECTOMY    . TEMPORAL ARTERY BIOPSY / LIGATION      Family History  Problem Relation Age of Onset  . CAD Father   . Colon cancer Father    Social History:  reports that she has never smoked. She has never used smokeless tobacco. Her alcohol and drug histories are not on file.  Allergies:  Allergies  Allergen Reactions  . Sulfa Antibiotics Rash    Medications Prior to Admission  Medication Sig Dispense Refill  . acetaminophen (TYLENOL) 325 MG tablet Take 650 mg by mouth every 6 (six) hours as needed for mild pain.    Marland Kitchen albuterol (PROVENTIL HFA;VENTOLIN HFA) 108 (90 Base) MCG/ACT inhaler Inhale 2 puffs into the lungs every 6 (six) hours as needed for wheezing or shortness of breath.    . budesonide-formoterol (SYMBICORT) 80-4.5 MCG/ACT inhaler Inhale 2 puffs into the lungs 2 (two) times daily.    . butalbital-acetaminophen-caffeine (FIORICET, ESGIC) 50-325-40 MG tablet Take 1 tablet by mouth 2 (two) times daily as needed for headache.    . butalbital-aspirin-caffeine  (FIORINAL) 50-325-40 MG capsule Take 1 capsule by mouth every 6 (six) hours as needed for headache.    . clonazePAM (KLONOPIN) 0.5 MG tablet Take 0.5 mg by mouth 2 (two) times daily as needed for anxiety.     Marland Kitchen desipramine (NORPRAMIN) 75 MG tablet Take 37.5 mg by mouth at bedtime.     . ferrous sulfate 325 (65 FE) MG tablet Take 325 mg by mouth 3 (three) times daily with meals.    . fluocinonide ointment (LIDEX) 4.26 % Apply 1 application topically at bedtime. Apply to finger tips    . FLUoxetine (PROZAC) 40 MG capsule Take 40 mg by mouth daily.  1  . fluticasone (FLONASE) 50 MCG/ACT nasal spray Place 1 spray into both nostrils daily.    Marland Kitchen gabapentin (NEURONTIN) 300 MG capsule Take 300 mg by mouth 3 (three) times daily.    Marland Kitchen HYDROcodone-acetaminophen (NORCO/VICODIN) 5-325 MG tablet Take 1 tablet by mouth every 8 (eight) hours as needed for pain.    Marland Kitchen losartan (COZAAR) 25 MG tablet Take 50 mg by mouth daily.    . meclizine (ANTIVERT) 25 MG tablet Take 25 mg by mouth 3 (three) times daily as needed for dizziness.  0  . metoCLOPramide (REGLAN) 10 MG tablet Take 10 mg by mouth at bedtime as needed for nausea.     . montelukast (SINGULAIR) 10 MG tablet Take 10 mg by mouth daily.     . nitrofurantoin, macrocrystal-monohydrate, (MACROBID) 100 MG capsule Take 100 mg by mouth 2 (  two) times daily.    Marland Kitchen omeprazole (PRILOSEC) 20 MG capsule Take 20 mg by mouth daily.    . promethazine (PHENERGAN) 25 MG tablet Take 25 mg by mouth every 6 (six) hours as needed for nausea or vomiting.    . traZODone (DESYREL) 100 MG tablet Take 100 mg by mouth at bedtime.  3    Results for orders placed or performed during the hospital encounter of 03/30/16 (from the past 48 hour(s))  CBC with Differential/Platelet     Status: Abnormal   Collection Time: 03/30/16  9:03 PM  Result Value Ref Range   WBC 8.7 3.6 - 11.0 K/uL   RBC 4.31 3.80 - 5.20 MIL/uL   Hemoglobin 13.1 12.0 - 16.0 g/dL   HCT 38.2 35.0 - 47.0 %   MCV 88.5  80.0 - 100.0 fL   MCH 30.5 26.0 - 34.0 pg   MCHC 34.4 32.0 - 36.0 g/dL   RDW 14.0 11.5 - 14.5 %   Platelets 249 150 - 440 K/uL   Neutrophils Relative % 86 %   Neutro Abs 7.5 (H) 1.4 - 6.5 K/uL   Lymphocytes Relative 4 %   Lymphs Abs 0.3 (L) 1.0 - 3.6 K/uL   Monocytes Relative 9 %   Monocytes Absolute 0.8 0.2 - 0.9 K/uL   Eosinophils Relative 1 %   Eosinophils Absolute 0.1 0 - 0.7 K/uL   Basophils Relative 0 %   Basophils Absolute 0.0 0 - 0.1 K/uL  Comprehensive metabolic panel     Status: Abnormal   Collection Time: 03/30/16  9:03 PM  Result Value Ref Range   Sodium 134 (L) 135 - 145 mmol/L   Potassium 4.0 3.5 - 5.1 mmol/L   Chloride 98 (L) 101 - 111 mmol/L   CO2 28 22 - 32 mmol/L   Glucose, Bld 135 (H) 65 - 99 mg/dL   BUN 10 6 - 20 mg/dL   Creatinine, Ser 0.81 0.44 - 1.00 mg/dL   Calcium 8.9 8.9 - 10.3 mg/dL   Total Protein 7.4 6.5 - 8.1 g/dL   Albumin 3.9 3.5 - 5.0 g/dL   AST 22 15 - 41 U/L   ALT 13 (L) 14 - 54 U/L   Alkaline Phosphatase 79 38 - 126 U/L   Total Bilirubin 0.4 0.3 - 1.2 mg/dL   GFR calc non Af Amer >60 >60 mL/min   GFR calc Af Amer >60 >60 mL/min    Comment: (NOTE) The eGFR has been calculated using the CKD EPI equation. This calculation has not been validated in all clinical situations. eGFR's persistently <60 mL/min signify possible Chronic Kidney Disease.    Anion gap 8 5 - 15  Lactic acid, plasma     Status: None   Collection Time: 03/30/16  9:07 PM  Result Value Ref Range   Lactic Acid, Venous 1.4 0.5 - 1.9 mmol/L  Urinalysis complete, with microscopic ([facility] only)     Status: Abnormal   Collection Time: 03/30/16  9:53 PM  Result Value Ref Range   Color, Urine AMBER (A) YELLOW   APPearance CLEAR (A) CLEAR   Glucose, UA NEGATIVE NEGATIVE mg/dL   Bilirubin Urine NEGATIVE NEGATIVE   Ketones, ur NEGATIVE NEGATIVE mg/dL   Specific Gravity, Urine 1.010 1.005 - 1.030   Hgb urine dipstick 1+ (A) NEGATIVE   pH 6.0 5.0 - 8.0   Protein, ur 30 (A)  NEGATIVE mg/dL   Nitrite POSITIVE (A) NEGATIVE   Leukocytes, UA 2+ (A) NEGATIVE  RBC / HPF 6-30 0 - 5 RBC/hpf   WBC, UA TOO NUMEROUS TO COUNT 0 - 5 WBC/hpf   Bacteria, UA NONE SEEN NONE SEEN   Squamous Epithelial / LPF NONE SEEN NONE SEEN  Lactic acid, plasma     Status: None   Collection Time: 03/31/16 12:20 AM  Result Value Ref Range   Lactic Acid, Venous 1.0 0.5 - 1.9 mmol/L  TSH     Status: None   Collection Time: 03/31/16  4:01 AM  Result Value Ref Range   TSH 0.785 0.350 - 4.500 uIU/mL   Dg Chest 1 View  Result Date: 03/30/2016 CLINICAL DATA:  Initial evaluation for acute shortness of breath, code sepsis. EXAM: CHEST 1 VIEW COMPARISON:  Prior radiograph from 05/24/2012. FINDINGS: Examination is somewhat limited due to patient positioning. Allowing for patient rotation, transverse heart size is stable, and remains within normal limits. Mediastinal silhouette within normal limits. Lungs are hypoinflated with elevation left hemidiaphragm, similar to previous. Associated left basilar atelectasis. No other focal infiltrates. No pulmonary edema or pleural effusion. No pneumothorax. No acute osseus abnormality. IMPRESSION: 1. Elevation of the left hemidiaphragm with associated left basilar atelectasis. 2. No other active cardiopulmonary disease identified. Electronically Signed   By: Jeannine Boga M.D.   On: 03/30/2016 22:01    Review of Systems  Constitutional: Positive for chills. Negative for fever.  HENT: Negative for sore throat and tinnitus.   Eyes: Negative for blurred vision and redness.  Respiratory: Negative for cough and shortness of breath.   Cardiovascular: Negative for chest pain, palpitations, orthopnea and PND.  Gastrointestinal: Positive for abdominal pain. Negative for diarrhea, nausea and vomiting.  Genitourinary: Positive for urgency. Negative for dysuria and frequency.  Musculoskeletal: Negative for joint pain and myalgias.  Skin: Negative for rash.       No  lesions  Neurological: Negative for speech change, focal weakness and weakness.  Endo/Heme/Allergies: Does not bruise/bleed easily.       No temperature intolerance  Psychiatric/Behavioral: Positive for hallucinations. Negative for depression and suicidal ideas.    Blood pressure (!) 97/58, pulse 78, temperature 98.6 F (37 C), temperature source Oral, resp. rate 19, height 5' 2"  (1.575 m), weight 64.2 kg (141 lb 8 oz), SpO2 93 %. Physical Exam  Vitals reviewed. Constitutional: She is oriented to person, place, and time. She appears well-developed and well-nourished. No distress.  HENT:  Head: Normocephalic and atraumatic.  Mouth/Throat: Oropharynx is clear and moist.  Eyes: Conjunctivae and EOM are normal. Pupils are equal, round, and reactive to light. No scleral icterus.  Neck: Normal range of motion. Neck supple. No JVD present. No tracheal deviation present. No thyromegaly present.  Cardiovascular: Normal rate, regular rhythm and normal heart sounds.  Exam reveals no gallop and no friction rub.   No murmur heard. Respiratory: Effort normal and breath sounds normal.  GI: Soft. Bowel sounds are normal. She exhibits no distension. There is no tenderness.  Genitourinary:  Genitourinary Comments: Deferred  Musculoskeletal: Normal range of motion. She exhibits no edema.  Lymphadenopathy:    She has no cervical adenopathy.  Neurological: She is alert and oriented to person, place, and time. No cranial nerve deficit. She exhibits normal muscle tone.  Skin: Skin is warm and dry. No rash noted. No erythema.  Psychiatric: She has a normal mood and affect. Her behavior is normal. Judgment and thought content normal.     Assessment/Plan This is a 70 year old female admitted for sepsis secondary to UTI. 1. Sepsis:  The patient intermittently meets criteria via fever, tachycardia and tachypnea. She does not have a leukocytosis as is commonly the case in elderly patients. She is hemodynamically  stable. Continue ceftriaxone. Follow cultures for growth and sensitivities. 2. Urinary tract infection: Antibiotics as above. Norpramin per home regimen 3. Confusion: Improved mental status. 4. Hypertension: Continue losartan 5. Asthma: Continue inhaled corticosteroid. DuoNeb as needed 6. 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 Uristat as needed 7. Depression: Continue Prozac and Klonopin 8. DVT prophylaxis: Lovenox 9. GI prophylaxis: Pantoprazole per home regimen The patient is a full code. Time spent on admission orders and patient care approximately 45 minutes  Harrie Foreman, MD 03/31/2016, 7:01 AM

## 2016-03-31 NOTE — Plan of Care (Signed)
Problem: Physical Regulation: Goal: Will remain free from infection Outcome: Not Progressing Admitted with medical dx of UTI. NS at 100 ml/hr. Rocephin given in the ED after blood cultures drawn. Printed information given to patient on the treatment for UTI's. Reviewed with patient and family prior to them departing for the evening.  Pt uses AZO at home. Pt instructed to let providing MD aware of any supplements used to prevent adverse or drug interactions.

## 2016-03-31 NOTE — ED Notes (Signed)
Dr. Sheryle Hailiamond, admitting MD in room.

## 2016-03-31 NOTE — Care Management (Signed)
Admitted to [facility] with the diagnosis of urinary tract infection. Lives with husband, Gerlene BurdockRichard, [phone]). Works at [employer]. Home Health through [facility] for home antibiotics about 4 years ago. No skilled nursing. Cane and rolling walker available, if needed. Takes care of all basic and instrumental activities of daily living herself, drives. Last seen Dr. Ellis SavageKiser on Monday in [place].  Prescriptions are filled at [facility] [place]. 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 Good appetite. Husband will transport.  Gwenette GreetBrenda S Burman Bruington RN MSN CCM Care Management [phone]

## 2016-03-31 NOTE — Progress Notes (Signed)
PHARMACY - PHYSICIAN COMMUNICATION CRITICAL VALUE ALERT - BLOOD CULTURE IDENTIFICATION (BCID)  Results for orders placed or performed during the hospital encounter of 03/30/16  Blood Culture ID Panel (Reflexed) (Collected: 03/30/2016  9:53 PM)  Result Value Ref Range   Enterococcus species NOT DETECTED NOT DETECTED   Listeria monocytogenes NOT DETECTED NOT DETECTED   Staphylococcus species NOT DETECTED NOT DETECTED   Staphylococcus aureus NOT DETECTED NOT DETECTED   Streptococcus species NOT DETECTED NOT DETECTED   Streptococcus agalactiae NOT DETECTED NOT DETECTED   Streptococcus pneumoniae NOT DETECTED NOT DETECTED   Streptococcus pyogenes NOT DETECTED NOT DETECTED   Acinetobacter baumannii NOT DETECTED NOT DETECTED   Enterobacteriaceae species DETECTED (A) NOT DETECTED   Enterobacter cloacae complex NOT DETECTED NOT DETECTED   Escherichia coli DETECTED (A) NOT DETECTED   Klebsiella oxytoca NOT DETECTED NOT DETECTED   Klebsiella pneumoniae NOT DETECTED NOT DETECTED   Proteus species NOT DETECTED NOT DETECTED   Serratia marcescens NOT DETECTED NOT DETECTED   Carbapenem resistance NOT DETECTED NOT DETECTED   Haemophilus influenzae NOT DETECTED NOT DETECTED   Neisseria meningitidis NOT DETECTED NOT DETECTED   Pseudomonas aeruginosa NOT DETECTED NOT DETECTED   Candida albicans NOT DETECTED NOT DETECTED   Candida glabrata NOT DETECTED NOT DETECTED   Candida krusei NOT DETECTED NOT DETECTED   Candida parapsilosis NOT DETECTED NOT DETECTED   Candida tropicalis NOT DETECTED NOT DETECTED    Name of physician (or Provider) Contacted: Dr Auburn BilberryShreyang [name]   Changes to prescribed antibiotics required: yes,  Will D/C ceftriaxone and begin Meropenem 2 gm IV Q8H  Raden Byington D 03/31/2016  2:56 PM

## 2016-03-31 NOTE — Progress Notes (Signed)
ANTIBIOTIC CONSULT NOTE - Follow-Up  Pharmacy Consult for Ceftriaxone  Indication: UTI  Allergies  Allergen Reactions  . Sulfa Antibiotics Rash    Patient Measurements: Height: 5\' 2"  (157.5 cm) Weight: 141 lb 8 oz (64.2 kg) IBW/kg (Calculated) : 50.1 Adjusted Body Weight:   Vital Signs: Temp: 98.4 F (36.9 C) (08/29 0756) Temp Source: Oral (08/29 0756) BP: 127/73 (08/29 0756) Pulse Rate: 68 (08/29 0756) Intake/Output from previous day: 08/28 0701 - 08/29 0700 In: 166.7 [I.V.:116.7; IV Piggyback:50] Out: -  Intake/Output from this shift: Total I/O In: 240 [P.O.:240] Out: -   Labs:  Recent Labs  03/30/16 2103  WBC 8.7  HGB 13.1  PLT 249  CREATININE 0.81   Estimated Creatinine Clearance: 56.8 mL/min (by C-G formula based on SCr of 0.81 mg/dL). No results for input(s): VANCOTROUGH, VANCOPEAK, VANCORANDOM, GENTTROUGH, GENTPEAK, GENTRANDOM, TOBRATROUGH, TOBRAPEAK, TOBRARND, AMIKACINPEAK, AMIKACINTROU, AMIKACIN in the last 72 hours.   Microbiology: Recent Results (from the past 720 hour(s))  Blood Culture (routine x 2)     Status: None (Preliminary result)   Collection Time: 03/30/16  9:53 PM  Result Value Ref Range Status   Specimen Description BLOOD LEFT FATTY CASTS  Final   Special Requests BOTTLES DRAWN AEROBIC AND ANAEROBIC 10CC  Final   Culture  Setup Time Organism ID to follow  Final   Culture NO GROWTH < 12 HOURS  Final   Report Status PENDING  Incomplete  Blood Culture (routine x 2)     Status: None (Preliminary result)   Collection Time: 03/30/16  9:53 PM  Result Value Ref Range Status   Specimen Description BLOOD RIGHT FATTY CASTS  Final   Special Requests BOTTLES DRAWN AEROBIC AND ANAEROBIC 10CC  Final   Culture NO GROWTH < 12 HOURS  Final   Report Status PENDING  Incomplete    Medical History: Past Medical History:  Diagnosis Date  . Asthma     Medications:  Scheduled:  . cefTRIAXone (ROCEPHIN)  IV  1 g Intravenous Q24H  . desipramine   37.5 mg Oral QHS  . docusate sodium  100 mg Oral BID  . enoxaparin (LOVENOX) injection  40 mg Subcutaneous QHS  . ferrous sulfate  325 mg Oral TID WC  . FLUoxetine  40 mg Oral Daily  . fluticasone  1 spray Each Nare Daily  . gabapentin  300 mg Oral TID  . mometasone-formoterol  2 puff Inhalation BID  . montelukast  10 mg Oral QHS  . pantoprazole  40 mg Oral QAC breakfast  . traZODone  100 mg Oral QHS   Assessment: 70 yo female who presents with a UTI  8/28 Blood Cx: NGTD 8/28 urine CX: in process  Goal of Therapy:  resolution of infection  Plan:  Expected duration 7 days with resolution of temperature and/or normalization of WBC   Ceftriaxone 8/28 >>  Continue Ceftriaxone 1gm IV Q24H.    Tresa EndoKelly m HardwickFuhrmann 03/31/2016,1:59 PM

## 2016-03-31 NOTE — Care Management Important Message (Signed)
Important Message  Patient Details  Name: Brooke [name] MRN: [id number] Date of Birth: 07/26/1946   Medicare Important Message Given:  Yes    Gwenette GreetBrenda S Yvanna Vidas, RN 03/31/2016, 11:04 AM

## 2016-04-01 LAB — URINE CULTURE

## 2016-04-01 MED ORDER — LOSARTAN POTASSIUM 50 MG PO TABS
50.0000 mg | ORAL_TABLET | Freq: Every day | ORAL | Status: DC
  Administered 2016-04-01 – 2016-04-02 (×2): 50 mg via ORAL
  Filled 2016-04-01 (×2): qty 1

## 2016-04-01 MED ORDER — SODIUM CHLORIDE 0.9 % IV SOLN
1.0000 g | Freq: Three times a day (TID) | INTRAVENOUS | Status: DC
  Administered 2016-04-01 – 2016-04-02 (×3): 1 g via INTRAVENOUS
  Filled 2016-04-01 (×5): qty 1

## 2016-04-01 NOTE — Evaluation (Signed)
Physical Therapy Evaluation Patient Details Name: Brooke [name] MRN: [id number] DOB: 12/21/1945 Today's Date: 04/01/2016   History of Present Illness  presented to ER secondary to AMS, agitation; admitted with sepsis related to UTI. OF note, patient with recent neck/brain surgery per her report (May 2017) with some degree of balance deficit on R side; denies participation with therapy post surgery.   Clinical Impression  Upon evaluation, patient alert and oriented; follows commands and demonstrates good insight/awareness.  Slightly tearful over news of staying in hospital for additional day.  Bilat UE/LE strength and ROM grossly WFL; no pain reported at this time.  Able to complete bed mobility indep; sit/stand, basic transfers and gait (150') without assist device, though generally unsteady and unsafe.  Higher level balance deficits evident with decreased ability to respond to any external perturbation or unexpected weight shift, placing patient at increased fall risk.  All mobility improved to close sup with use of RW for additional (200') gait trial. Do recommend continued use of RW with all mobility at this time; patient voiced understanding/agreement. Would benefit from skilled PT to address above deficits and promote optimal return to PLOF; recommend transition to STR upon discharge from acute hospitalization.     Follow Up Recommendations Outpatient PT    Equipment Recommendations  Rolling walker with 5" wheels    Recommendations for Other Services       Precautions / Restrictions Precautions Precautions: Fall Restrictions Weight Bearing Restrictions: No      Mobility  Bed Mobility Overal bed mobility: Modified Independent                Transfers Overall transfer level: Needs assistance   Transfers: Sit to/from Stand Sit to Stand: Min guard;Min assist         General transfer comment: patient preferring to hold IV pole/counter top for external  stabilization  Ambulation/Gait Ambulation/Gait assistance: Min guard;Min assist Ambulation Distance (Feet): 150 Feet Assistive device: None       General Gait Details: very short step height/length, shuffling with step to pattern. Very tenuous and unsteady.  constantly reaching for walls/furniture.  Stairs            Wheelchair Mobility    Modified Rankin (Stroke Patients Only)       Balance Overall balance assessment: Needs assistance Sitting-balance support: No upper extremity supported;Feet supported Sitting balance-Leahy Scale: Good     Standing balance support: No upper extremity supported Standing balance-Leahy Scale: Fair                               Pertinent Vitals/Pain Pain Assessment: No/denies pain    Home Living Family/patient expects to be discharged to:: Private residence Living Arrangements: Spouse/significant other Available Help at Discharge: Family Type of Home: House Home Access: Stairs to enter Entrance Stairs-Rails: None Entrance Stairs-Number of Steps: 1 Home Layout: One level        Prior Function Level of Independence: Independent         Comments: Indep with ADLs, household and community activities; works in Production assistant, radio at [employer].  + driving.     Hand Dominance        Extremity/Trunk Assessment   Upper Extremity Assessment: Overall WFL for tasks assessed           Lower Extremity Assessment: Overall WFL for tasks assessed         Communication      Cognition  Arousal/Alertness: Awake/alert Behavior During Therapy: WFL for tasks assessed/performed Overall Cognitive Status: Within Functional Limits for tasks assessed                      General Comments      Exercises Other Exercises Other Exercises: 200' with RW, close sup--progressed to reciprocal stepping pattern with improved cadence/gait speed, improved safety and overall confidence/stability.  Recommend  continued use of RW with all mobiltiy at this time; patient voiced understanding/agreement.      Assessment/Plan    PT Assessment Patient needs continued PT services  PT Diagnosis Difficulty walking;Generalized weakness   PT Problem List Decreased strength;Decreased activity tolerance;Decreased balance;Decreased mobility  PT Treatment Interventions DME instruction;Gait training;Stair training;Functional mobility training;Therapeutic activities;Therapeutic exercise;Balance training;Patient/family education   PT Goals (Current goals can be found in the Care Plan section) Acute Rehab PT Goals Patient Stated Goal: to get back to my home PT Goal Formulation: With patient/family Time For Goal Achievement: 04/15/16 Potential to Achieve Goals: Good    Frequency Min 2X/week   Barriers to discharge        Co-evaluation               End of Session Equipment Utilized During Treatment: Gait belt Activity Tolerance: Patient tolerated treatment well Patient left: in chair;with call bell/phone within reach;with chair alarm set Nurse Communication: Mobility status         Time: 1153-1209 PT Time Calculation (min) (ACUTE ONLY): 16 min   Charges:   PT Evaluation $PT Eval Low Complexity: 1 Procedure PT Treatments $Gait Training: 8-22 mins   PT G Codes:       Majour Frei H. Manson PasseyBrown, PT, DPT, NCS 04/01/16, 4:25 PM [phone]

## 2016-04-01 NOTE — Progress Notes (Signed)
pts culture  Came back positive . Pt will remain on merropenum   Iv.  Pt will  Remain here  At least another  24 hrs.  No resp distress.  Voiding well and bms.daily. ivfs cont.

## 2016-04-01 NOTE — Progress Notes (Signed)
ANTIBIOTIC CONSULT NOTE - Follow-Up  Pharmacy Consult for Meropenem Indication: Bacteremia  Allergies  Allergen Reactions  . Sulfa Antibiotics Rash    Patient Measurements: Height: 5\' 2"  (157.5 cm) Weight: 139 lb 6.4 oz (63.2 kg) IBW/kg (Calculated) : 50.1 Adjusted Body Weight:   Vital Signs: Temp: 98.3 F (36.8 C) (08/30 0755) Temp Source: Oral (08/30 0755) BP: 159/91 (08/30 0755) Pulse Rate: 82 (08/30 0755) Intake/Output from previous day: 08/29 0701 - 08/30 0700 In: 2523.8 [P.O.:720; I.V.:1603.8; IV Piggyback:200] Out: -  Intake/Output from this shift: No intake/output data recorded.  Labs:  Recent Labs  03/30/16 2103  WBC 8.7  HGB 13.1  PLT 249  CREATININE 0.81   Estimated Creatinine Clearance: 56.4 mL/min (by C-G formula based on SCr of 0.81 mg/dL). No results for input(s): VANCOTROUGH, VANCOPEAK, VANCORANDOM, GENTTROUGH, GENTPEAK, GENTRANDOM, TOBRATROUGH, TOBRAPEAK, TOBRARND, AMIKACINPEAK, AMIKACINTROU, AMIKACIN in the last 72 hours.   Microbiology: Recent Results (from the past 720 hour(s))  Blood Culture (routine x 2)     Status: Abnormal (Preliminary result)   Collection Time: 03/30/16  9:53 PM  Result Value Ref Range Status   Specimen Description BLOOD LEFT FATTY CASTS  Final   Special Requests BOTTLES DRAWN AEROBIC AND ANAEROBIC 10CC  Final   Culture  Setup Time   Final    GRAM NEGATIVE RODS IN BOTH AEROBIC AND ANAEROBIC BOTTLES CRITICAL RESULT CALLED TO, READ BACK BY AND VERIFIED WITH: JASON ROBBINS AT 1420 03/31/16 SDR Performed at [facility]    Culture ESCHERICHIA COLI (A)  Final   Report Status PENDING  Incomplete  Blood Culture (routine x 2)     Status: None (Preliminary result)   Collection Time: 03/30/16  9:53 PM  Result Value Ref Range Status   Specimen Description BLOOD RIGHT FATTY CASTS  Final   Special Requests BOTTLES DRAWN AEROBIC AND ANAEROBIC 10CC  Final   Culture NO GROWTH 1 DAY  Final   Report Status PENDING   Incomplete  Blood Culture ID Panel (Reflexed)     Status: Abnormal   Collection Time: 03/30/16  9:53 PM  Result Value Ref Range Status   Enterococcus species NOT DETECTED NOT DETECTED Final   Listeria monocytogenes NOT DETECTED NOT DETECTED Final   Staphylococcus species NOT DETECTED NOT DETECTED Final   Staphylococcus aureus NOT DETECTED NOT DETECTED Final   Streptococcus species NOT DETECTED NOT DETECTED Final   Streptococcus agalactiae NOT DETECTED NOT DETECTED Final   Streptococcus pneumoniae NOT DETECTED NOT DETECTED Final   Streptococcus pyogenes NOT DETECTED NOT DETECTED Final   Acinetobacter baumannii NOT DETECTED NOT DETECTED Final   Enterobacteriaceae species DETECTED (A) NOT DETECTED Final    Comment: CRITICAL RESULT CALLED TO, READ BACK BY AND VERIFIED WITH: JASON ROBBINS AT 1420 03/31/16 SDR    Enterobacter cloacae complex NOT DETECTED NOT DETECTED Final   Escherichia coli DETECTED (A) NOT DETECTED Final    Comment: CRITICAL RESULT CALLED TO, READ BACK BY AND VERIFIED WITH: JASON ROBBINS AT 1420 03/31/16 SDR    Klebsiella oxytoca NOT DETECTED NOT DETECTED Final   Klebsiella pneumoniae NOT DETECTED NOT DETECTED Final   Proteus species NOT DETECTED NOT DETECTED Final   Serratia marcescens NOT DETECTED NOT DETECTED Final   Carbapenem resistance NOT DETECTED NOT DETECTED Final   Haemophilus influenzae NOT DETECTED NOT DETECTED Final   Neisseria meningitidis NOT DETECTED NOT DETECTED Final   Pseudomonas aeruginosa NOT DETECTED NOT DETECTED Final   Candida albicans NOT DETECTED NOT DETECTED Final   Candida  glabrata NOT DETECTED NOT DETECTED Final   Candida krusei NOT DETECTED NOT DETECTED Final   Candida parapsilosis NOT DETECTED NOT DETECTED Final   Candida tropicalis NOT DETECTED NOT DETECTED Final    Medical History: Past Medical History:  Diagnosis Date  . Asthma     Medications:  Scheduled:  . desipramine  37.5 mg Oral QHS  . docusate sodium  100 mg Oral BID   . enoxaparin (LOVENOX) injection  40 mg Subcutaneous QHS  . ferrous sulfate  325 mg Oral TID WC  . FLUoxetine  40 mg Oral Daily  . fluticasone  1 spray Each Nare Daily  . gabapentin  300 mg Oral TID  . meropenem (MERREM) IV  1 g Intravenous Q8H  . mometasone-formoterol  2 puff Inhalation BID  . montelukast  10 mg Oral QHS  . pantoprazole  40 mg Oral QAC breakfast  . traZODone  100 mg Oral QHS   Assessment: 70 yo female who presents with a UTI and found to have bacteremia  8/28 Blood Cx: e. Coli and enterobacteriaceae  8/28 urine CX: in process  Ceftriaxone 8/28 >> 8/28 Meropenem 8/29>>  Goal of Therapy:  resolution of infection  Plan:  Ceftriaxone DC'd and started meropenem 2g IV Q8h. Will decrease meropenem dose to 1g IV Q8h.   Tresa EndoKelly m Memory DanceFuhrmann 04/01/2016,12:36 PM

## 2016-04-01 NOTE — Progress Notes (Signed)
[facility] [place] [facility]                                                                                                                                                                                            Patient Demographics   Brooke MinusBrenda [name], is a 70 y.o. female, DOB - 02/24/1946, ZOX:[id number]  Admit date - 03/30/2016   Admitting Physician Arnaldo NatalMichael S Diamond, MD  Outpatient Primary MD for the patient is Earnestine LeysKIZER,JOHN S, MD   LOS - 1  Subjective: Patient feels well denies any complaints  Her blood culture shows gram-negative rods  Review of Systems:   CONSTITUTIONAL: No documented fever. No fatigue, weakness. No weight gain, no weight loss.  EYES: No blurry or double vision.  ENT: No tinnitus. No postnasal drip. No redness of the oropharynx.  RESPIRATORY: No cough, no wheeze, no hemoptysis. No dyspnea.  CARDIOVASCULAR: No chest pain. No orthopnea. No palpitations. No syncope.  GASTROINTESTINAL: No nausea, no vomiting or diarrhea. No abdominal pain. No melena or hematochezia.  GENITOURINARY: No dysuria or hematuria.  ENDOCRINE: No polyuria or nocturia. No heat or cold intolerance.  HEMATOLOGY: No anemia. No bruising. No bleeding.  INTEGUMENTARY: No rashes. No lesions.  MUSCULOSKELETAL: No arthritis. No swelling. No gout.  NEUROLOGIC: No numbness, tingling, or ataxia. No seizure-type activity.  PSYCHIATRIC: No anxiety. No insomnia. No ADD.    Vitals:   Vitals:   04/01/16 0408 04/01/16 0500 04/01/16 0755 04/01/16 1335  BP: (!) 161/81  (!) 159/91 (!) 164/96  Pulse: 86  82 85  Resp: 16  18 18   Temp: 98.1 F (36.7 C)  98.3 F (36.8 C) 98.6 F (37 C)  TempSrc:   Oral   SpO2: 91%  94% 100%  Weight:  63.2 kg (139 lb 6.4 oz)    Height:        Wt Readings from Last 3 Encounters:  04/01/16 63.2 kg (139 lb 6.4 oz)     Intake/Output Summary (Last 24 hours) at 04/01/16 1425 Last data filed at 04/01/16 1300  Gross per 24 hour  Intake           2283.83 ml  Output                0 ml  Net          2283.83 ml    Physical Exam:   GENERAL: Pleasant-appearing in no apparent distress.  HEAD, EYES, EARS, NOSE AND THROAT: Atraumatic, normocephalic. Extraocular muscles are intact. Pupils equal and reactive to light. Sclerae anicteric. No conjunctival injection. No oro-pharyngeal erythema.  NECK: Supple. There is no  jugular venous distention. No bruits, no lymphadenopathy, no thyromegaly.  HEART: Regular rate and rhythm,. No murmurs, no rubs, no clicks.  LUNGS: Clear to auscultation bilaterally. No rales or rhonchi. No wheezes.  ABDOMEN: Soft, flat, nontender, nondistended. Has good bowel sounds. No hepatosplenomegaly appreciated.  EXTREMITIES: No evidence of any cyanosis, clubbing, or peripheral edema.  +2 pedal and radial pulses bilaterally.  NEUROLOGIC: The patient is alert, awake, and oriented x3 with no focal motor or sensory deficits appreciated bilaterally.  SKIN: Moist and warm with no rashes appreciated.  Psych: Not anxious, depressed LN: No inguinal LN enlargement    Antibiotics   Anti-infectives    Start     Dose/Rate Route Frequency Ordered Stop   04/01/16 2200  cefTRIAXone (ROCEPHIN) 1 g in dextrose 5 % 50 mL IVPB  Status:  Discontinued     1 g 100 mL/hr over 30 Minutes Intravenous Every 24 hours 03/31/16 0212 03/31/16 0216   04/01/16 1400  meropenem (MERREM) 1 g in sodium chloride 0.9 % 100 mL IVPB     1 g 200 mL/hr over 30 Minutes Intravenous Every 8 hours 04/01/16 1234     03/31/16 1600  meropenem (MERREM) 2 g in sodium chloride 0.9 % 100 mL IVPB  Status:  Discontinued     2 g 200 mL/hr over 30 Minutes Intravenous Every 8 hours 03/31/16 1456 04/01/16 1234   03/30/16 2230  cefTRIAXone (ROCEPHIN) 1 g in dextrose 5 % 50 mL IVPB  Status:  Discontinued     1 g 100 mL/hr over 30 Minutes Intravenous Every 24 hours 03/30/16 2127 03/31/16 1454   03/30/16 2130  cefTRIAXone (ROCEPHIN) 2 g in dextrose 5 % 50 mL IVPB   Status:  Discontinued     2 g 100 mL/hr over 30 Minutes Intravenous  Once 03/30/16 2121 03/30/16 2127      Medications   Scheduled Meds: . desipramine  37.5 mg Oral QHS  . docusate sodium  100 mg Oral BID  . enoxaparin (LOVENOX) injection  40 mg Subcutaneous QHS  . ferrous sulfate  325 mg Oral TID WC  . FLUoxetine  40 mg Oral Daily  . fluticasone  1 spray Each Nare Daily  . gabapentin  300 mg Oral TID  . losartan  50 mg Oral Daily  . meropenem (MERREM) IV  1 g Intravenous Q8H  . mometasone-formoterol  2 puff Inhalation BID  . montelukast  10 mg Oral QHS  . pantoprazole  40 mg Oral QAC breakfast  . traZODone  100 mg Oral QHS   Continuous Infusions: . sodium chloride 50 mL/hr at 04/01/16 1254   PRN Meds:.acetaminophen **OR** acetaminophen, butalbital-acetaminophen-caffeine, clonazePAM, HYDROcodone-acetaminophen, ipratropium-albuterol, meclizine, metoCLOPramide, ondansetron **OR** ondansetron (ZOFRAN) IV   Data Review:   Micro Results Recent Results (from the past 240 hour(s))  Blood Culture (routine x 2)     Status: Abnormal (Preliminary result)   Collection Time: 03/30/16  9:53 PM  Result Value Ref Range Status   Specimen Description BLOOD LEFT FATTY CASTS  Final   Special Requests BOTTLES DRAWN AEROBIC AND ANAEROBIC 10CC  Final   Culture  Setup Time   Final    GRAM NEGATIVE RODS IN BOTH AEROBIC AND ANAEROBIC BOTTLES CRITICAL RESULT CALLED TO, READ BACK BY AND VERIFIED WITH: JASON ROBBINS AT 1420 03/31/16 SDR Performed at [facility]    Culture ESCHERICHIA COLI (A)  Final   Report Status PENDING  Incomplete  Blood Culture (routine x 2)     Status:  None (Preliminary result)   Collection Time: 03/30/16  9:53 PM  Result Value Ref Range Status   Specimen Description BLOOD RIGHT FATTY CASTS  Final   Special Requests BOTTLES DRAWN AEROBIC AND ANAEROBIC 10CC  Final   Culture NO GROWTH 1 DAY  Final   Report Status PENDING  Incomplete  Urine culture     Status:  Abnormal   Collection Time: 03/30/16  9:53 PM  Result Value Ref Range Status   Specimen Description URINE, RANDOM  Final   Special Requests NONE  Final   Culture (A)  Final    <10,000 COLONIES/mL INSIGNIFICANT GROWTH Performed at [facility]    Report Status 04/01/2016 FINAL  Final  Blood Culture ID Panel (Reflexed)     Status: Abnormal   Collection Time: 03/30/16  9:53 PM  Result Value Ref Range Status   Enterococcus species NOT DETECTED NOT DETECTED Final   Listeria monocytogenes NOT DETECTED NOT DETECTED Final   Staphylococcus species NOT DETECTED NOT DETECTED Final   Staphylococcus aureus NOT DETECTED NOT DETECTED Final   Streptococcus species NOT DETECTED NOT DETECTED Final   Streptococcus agalactiae NOT DETECTED NOT DETECTED Final   Streptococcus pneumoniae NOT DETECTED NOT DETECTED Final   Streptococcus pyogenes NOT DETECTED NOT DETECTED Final   Acinetobacter baumannii NOT DETECTED NOT DETECTED Final   Enterobacteriaceae species DETECTED (A) NOT DETECTED Final    Comment: CRITICAL RESULT CALLED TO, READ BACK BY AND VERIFIED WITH: JASON ROBBINS AT 1420 03/31/16 SDR    Enterobacter cloacae complex NOT DETECTED NOT DETECTED Final   Escherichia coli DETECTED (A) NOT DETECTED Final    Comment: CRITICAL RESULT CALLED TO, READ BACK BY AND VERIFIED WITH: JASON ROBBINS AT 1420 03/31/16 SDR    Klebsiella oxytoca NOT DETECTED NOT DETECTED Final   Klebsiella pneumoniae NOT DETECTED NOT DETECTED Final   Proteus species NOT DETECTED NOT DETECTED Final   Serratia marcescens NOT DETECTED NOT DETECTED Final   Carbapenem resistance NOT DETECTED NOT DETECTED Final   Haemophilus influenzae NOT DETECTED NOT DETECTED Final   Neisseria meningitidis NOT DETECTED NOT DETECTED Final   Pseudomonas aeruginosa NOT DETECTED NOT DETECTED Final   Candida albicans NOT DETECTED NOT DETECTED Final   Candida glabrata NOT DETECTED NOT DETECTED Final   Candida krusei NOT DETECTED NOT DETECTED Final    Candida parapsilosis NOT DETECTED NOT DETECTED Final   Candida tropicalis NOT DETECTED NOT DETECTED Final    Radiology Reports Dg Chest 1 View  Result Date: 03/30/2016 CLINICAL DATA:  Initial evaluation for acute shortness of breath, code sepsis. EXAM: CHEST 1 VIEW COMPARISON:  Prior radiograph from 05/24/2012. FINDINGS: Examination is somewhat limited due to patient positioning. Allowing for patient rotation, transverse heart size is stable, and remains within normal limits. Mediastinal silhouette within normal limits. Lungs are hypoinflated with elevation left hemidiaphragm, similar to previous. Associated left basilar atelectasis. No other focal infiltrates. No pulmonary edema or pleural effusion. No pneumothorax. No acute osseus abnormality. IMPRESSION: 1. Elevation of the left hemidiaphragm with associated left basilar atelectasis. 2. No other active cardiopulmonary disease identified. Electronically Signed   By: Rise Mu M.D.   On: 03/30/2016 22:01   Ct Head Wo Contrast  Result Date: 03/31/2016 CLINICAL DATA:  Blurred vision, prior RIGHT side facial nerve surgery May 2017, prior temporal artery biopsy and ligation EXAM: CT HEAD WITHOUT CONTRAST TECHNIQUE: Contiguous axial images were obtained from the base of the skull through the vertex without intravenous contrast. COMPARISON:  None. FINDINGS: Prior RIGHT  occipital craniotomy. Radiopacity identified at the RIGHT lateral aspect of the pons, likely an implanted surgical object. Mild generalized atrophy. Normal ventricular morphology. No midline shift or mass effect. 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 No intracranial hemorrhage, mass lesion, or evidence acute infarction. No extra-axial fluid collection. Osseous structures unremarkable. IMPRESSION: Post RIGHT occipital craniotomy with likely implanted surgical object at the RIGHT lateral aspect of the pons. Small old appearing lacunar infarct at RIGHT caudate head. No  other definite intracranial abnormalities identified. Electronically Signed   By: Ulyses Southward M.D.   On: 03/31/2016 15:06     CBC  Recent Labs Lab 03/30/16 2103  WBC 8.7  HGB 13.1  HCT 38.2  PLT 249  MCV 88.5  MCH 30.5  MCHC 34.4  RDW 14.0  LYMPHSABS 0.3*  MONOABS 0.8  EOSABS 0.1  BASOSABS 0.0    Chemistries   Recent Labs Lab 03/30/16 2103  NA 134*  K 4.0  CL 98*  CO2 28  GLUCOSE 135*  BUN 10  CREATININE 0.81  CALCIUM 8.9  AST 22  ALT 13*  ALKPHOS 79  BILITOT 0.4   ------------------------------------------------------------------------------------------------------------------ estimated creatinine clearance is 56.4 mL/min (by C-G formula based on SCr of 0.81 mg/dL). ------------------------------------------------------------------------------------------------------------------  Recent Labs  03/31/16 0401  HGBA1C 5.4   ------------------------------------------------------------------------------------------------------------------ No results for input(s): CHOL, HDL, LDLCALC, TRIG, CHOLHDL, LDLDIRECT in the last 72 hours. ------------------------------------------------------------------------------------------------------------------  Recent Labs  03/31/16 0401  TSH 0.785   ------------------------------------------------------------------------------------------------------------------ No results for input(s): VITAMINB12, FOLATE, FERRITIN, TIBC, IRON, RETICCTPCT in the last 72 hours.  Coagulation profile No results for input(s): INR, PROTIME in the last 168 hours.  No results for input(s): DDIMER in the last 72 hours.  Cardiac Enzymes No results for input(s): CKMB, TROPONINI, MYOGLOBIN in the last 168 hours.  Invalid input(s): CK ------------------------------------------------------------------------------------------------------------------ Invalid input(s): POCBNP    Assessment & Plan   This is a 70 year old female admitted for  sepsis secondary to UTI. 1. Sepsis: Due to urinary tract infection Gram-negative rods with also Enterobacter detected with Escherichia coli currently on meropenem  2. Urinary tract infection: Antibiotics as above. Norpramin per home regimen 3. Confusion: Improved mental status.Now resolved CT scan shows no new changes  4. Hypertension: Blood pressurenormal continue losartan  5. Asthma: Continue inhaled corticosteroid. DuoNeb as needed 6. 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 Uristat as needed 7. Depression: Continue Prozac and Klonopin 8. DVT prophylaxis: Lovenox 9. GI prophylaxis: Pantoprazole per home regimen       Code Status Orders        Start     Ordered   03/31/16 0213  Full code  Continuous     03/31/16 0212    Code Status History    Date Active Date Inactive Code Status Order ID Comments User Context   This patient has a current code status but no historical code status.           None DVT Prophylaxis  Lovenox   Lab Results  Component Value Date   PLT 249 03/30/2016     Time Spent Greater than 50% of time spent in care coordination and counseling patient regarding the condition and plan of care.   Auburn Bilberry M.D on 04/01/2016 at 2:25 PM  Between 7am to 6pm - Pager - [phone]  After 6pm go to [email or web address] - password [email or web address]  [facility]   Office  [phone]

## 2016-04-01 NOTE — Progress Notes (Signed)
[facility] [place] at [facility]                                                                                                                                                                                            Patient Demographics   Brooke [name], is a 70 y.o. female, DOB - 1946-05-15, AVW:[id number]  Admit date - 03/30/2016   Admitting Physician Arnaldo Natal, MD  Outpatient Primary MD for the patient is Earnestine Leys, MD   LOS - 1  Subjective: Patient admitted with sepsis due to uti and syncope feeling better  Daughter at bedside concern about a CVA  Review of Systems:   CONSTITUTIONAL: No documented fever. No fatigue, weakness. No weight gain, no weight loss.  EYES: No blurry or double vision.  ENT: No tinnitus. No postnasal drip. No redness of the oropharynx.  RESPIRATORY: No cough, no wheeze, no hemoptysis. No dyspnea.  CARDIOVASCULAR: No chest pain. No orthopnea. No palpitations. No syncope.  GASTROINTESTINAL: No nausea, no vomiting or diarrhea. No abdominal pain. No melena or hematochezia.  GENITOURINARY: No dysuria or hematuria.  ENDOCRINE: No polyuria or nocturia. No heat or cold intolerance.  HEMATOLOGY: No anemia. No bruising. No bleeding.  INTEGUMENTARY: No rashes. No lesions.  MUSCULOSKELETAL: No arthritis. No swelling. No gout.  NEUROLOGIC: No numbness, tingling, or ataxia. No seizure-type activity.  PSYCHIATRIC: No anxiety. No insomnia. No ADD.    Vitals:   Vitals:   04/01/16 0408 04/01/16 0500 04/01/16 0755 04/01/16 1335  BP: (!) 161/81  (!) 159/91 (!) 164/96  Pulse: 86  82 85  Resp: 16  18 18   Temp: 98.1 F (36.7 C)  98.3 F (36.8 C) 98.6 F (37 C)  TempSrc:   Oral   SpO2: 91%  94% 100%  Weight:  63.2 kg (139 lb 6.4 oz)    Height:        Wt Readings from Last 3 Encounters:  04/01/16 63.2 kg (139 lb 6.4 oz)     Intake/Output Summary (Last 24 hours) at 04/01/16 1420 Last data filed at 04/01/16 1300  Gross  per 24 hour  Intake          2283.83 ml  Output                0 ml  Net          2283.83 ml    Physical Exam:   GENERAL: Pleasant-appearing in no apparent distress.  HEAD, EYES, EARS, NOSE AND THROAT: Atraumatic, normocephalic. Extraocular muscles are intact. Pupils equal and reactive to light. Sclerae anicteric. No conjunctival injection. No oro-pharyngeal erythema.  NECK: Supple. There is no jugular venous distention. No bruits, no lymphadenopathy, no thyromegaly.  HEART: Regular rate and rhythm,. No murmurs, no rubs, no clicks.  LUNGS: Clear to auscultation bilaterally. No rales or rhonchi. No wheezes.  ABDOMEN: Soft, flat, nontender, nondistended. Has good bowel sounds. No hepatosplenomegaly appreciated.  EXTREMITIES: No evidence of any cyanosis, clubbing, or peripheral edema.  +2 pedal and radial pulses bilaterally.  NEUROLOGIC: The patient is alert, awake, and oriented x3 with no focal motor or sensory deficits appreciated bilaterally.  SKIN: Moist and warm with no rashes appreciated.  Psych: Not anxious, depressed LN: No inguinal LN enlargement    Antibiotics   Anti-infectives    Start     Dose/Rate Route Frequency Ordered Stop   04/01/16 2200  cefTRIAXone (ROCEPHIN) 1 g in dextrose 5 % 50 mL IVPB  Status:  Discontinued     1 g 100 mL/hr over 30 Minutes Intravenous Every 24 hours 03/31/16 0212 03/31/16 0216   04/01/16 1400  meropenem (MERREM) 1 g in sodium chloride 0.9 % 100 mL IVPB     1 g 200 mL/hr over 30 Minutes Intravenous Every 8 hours 04/01/16 1234     03/31/16 1600  meropenem (MERREM) 2 g in sodium chloride 0.9 % 100 mL IVPB  Status:  Discontinued     2 g 200 mL/hr over 30 Minutes Intravenous Every 8 hours 03/31/16 1456 04/01/16 1234   03/30/16 2230  cefTRIAXone (ROCEPHIN) 1 g in dextrose 5 % 50 mL IVPB  Status:  Discontinued     1 g 100 mL/hr over 30 Minutes Intravenous Every 24 hours 03/30/16 2127 03/31/16 1454   03/30/16 2130  cefTRIAXone (ROCEPHIN) 2 g in  dextrose 5 % 50 mL IVPB  Status:  Discontinued     2 g 100 mL/hr over 30 Minutes Intravenous  Once 03/30/16 2121 03/30/16 2127      Medications   Scheduled Meds: . desipramine  37.5 mg Oral QHS  . docusate sodium  100 mg Oral BID  . enoxaparin (LOVENOX) injection  40 mg Subcutaneous QHS  . ferrous sulfate  325 mg Oral TID WC  . FLUoxetine  40 mg Oral Daily  . fluticasone  1 spray Each Nare Daily  . gabapentin  300 mg Oral TID  . losartan  50 mg Oral Daily  . meropenem (MERREM) IV  1 g Intravenous Q8H  . mometasone-formoterol  2 puff Inhalation BID  . montelukast  10 mg Oral QHS  . pantoprazole  40 mg Oral QAC breakfast  . traZODone  100 mg Oral QHS   Continuous Infusions: . sodium chloride 50 mL/hr at 04/01/16 1254   PRN Meds:.acetaminophen **OR** acetaminophen, butalbital-acetaminophen-caffeine, clonazePAM, HYDROcodone-acetaminophen, ipratropium-albuterol, meclizine, metoCLOPramide, ondansetron **OR** ondansetron (ZOFRAN) IV   Data Review:   Micro Results Recent Results (from the past 240 hour(s))  Blood Culture (routine x 2)     Status: Abnormal (Preliminary result)   Collection Time: 03/30/16  9:53 PM  Result Value Ref Range Status   Specimen Description BLOOD LEFT FATTY CASTS  Final   Special Requests BOTTLES DRAWN AEROBIC AND ANAEROBIC 10CC  Final   Culture  Setup Time   Final    GRAM NEGATIVE RODS IN BOTH AEROBIC AND ANAEROBIC BOTTLES CRITICAL RESULT CALLED TO, READ BACK BY AND VERIFIED WITH: JASON ROBBINS AT 1420 03/31/16 SDR Performed at [facility]    Culture ESCHERICHIA COLI (A)  Final   Report Status PENDING  Incomplete  Blood Culture (routine x 2)  Status: None (Preliminary result)   Collection Time: 03/30/16  9:53 PM  Result Value Ref Range Status   Specimen Description BLOOD RIGHT FATTY CASTS  Final   Special Requests BOTTLES DRAWN AEROBIC AND ANAEROBIC 10CC  Final   Culture NO GROWTH 1 DAY  Final   Report Status PENDING  Incomplete  Urine  culture     Status: Abnormal   Collection Time: 03/30/16  9:53 PM  Result Value Ref Range Status   Specimen Description URINE, RANDOM  Final   Special Requests NONE  Final   Culture (A)  Final    <10,000 COLONIES/mL INSIGNIFICANT GROWTH [facility]    Report Status 04/01/2016 FINAL  Final  Blood Culture ID Panel (Reflexed)     Status: Abnormal   Collection Time: 03/30/16  9:53 PM  Result Value Ref Range Status   Enterococcus species NOT DETECTED NOT DETECTED Final   Listeria monocytogenes NOT DETECTED NOT DETECTED Final   Staphylococcus species NOT DETECTED NOT DETECTED Final   Staphylococcus aureus NOT DETECTED NOT DETECTED Final   Streptococcus species NOT DETECTED NOT DETECTED Final   Streptococcus agalactiae NOT DETECTED NOT DETECTED Final   Streptococcus pneumoniae NOT DETECTED NOT DETECTED Final   Streptococcus pyogenes NOT DETECTED NOT DETECTED Final   Acinetobacter baumannii NOT DETECTED NOT DETECTED Final   Enterobacteriaceae species DETECTED (A) NOT DETECTED Final    Comment: CRITICAL RESULT CALLED TO, READ BACK BY AND VERIFIED WITH: JASON ROBBINS AT 1420 03/31/16 SDR    Enterobacter cloacae complex NOT DETECTED NOT DETECTED Final   Escherichia coli DETECTED (A) NOT DETECTED Final    Comment: CRITICAL RESULT CALLED TO, READ BACK BY AND VERIFIED WITH: JASON ROBBINS AT 1420 03/31/16 SDR    Klebsiella oxytoca NOT DETECTED NOT DETECTED Final   Klebsiella pneumoniae NOT DETECTED NOT DETECTED Final   Proteus species NOT DETECTED NOT DETECTED Final   Serratia marcescens NOT DETECTED NOT DETECTED Final   Carbapenem resistance NOT DETECTED NOT DETECTED Final   Haemophilus influenzae NOT DETECTED NOT DETECTED Final   Neisseria meningitidis NOT DETECTED NOT DETECTED Final   Pseudomonas aeruginosa NOT DETECTED NOT DETECTED Final   Candida albicans NOT DETECTED NOT DETECTED Final   Candida glabrata NOT DETECTED NOT DETECTED Final   Candida krusei NOT DETECTED  NOT DETECTED Final   Candida parapsilosis NOT DETECTED NOT DETECTED Final   Candida tropicalis NOT DETECTED NOT DETECTED Final    Radiology Reports Dg Chest 1 View  Result Date: 03/30/2016 CLINICAL DATA:  Initial evaluation for acute shortness of breath, code sepsis. EXAM: CHEST 1 VIEW COMPARISON:  Prior radiograph from 05/24/2012. FINDINGS: Examination is somewhat limited due to patient positioning. Allowing for patient rotation, transverse heart size is stable, and remains within normal limits. Mediastinal silhouette within normal limits. Lungs are hypoinflated with elevation left hemidiaphragm, similar to previous. Associated left basilar atelectasis. No other focal infiltrates. No pulmonary edema or pleural effusion. No pneumothorax. No acute osseus abnormality. IMPRESSION: 1. Elevation of the left hemidiaphragm with associated left basilar atelectasis. 2. No other active cardiopulmonary disease identified. Electronically Signed   By: Rise MuBenjamin  McClintock M.D.   On: 03/30/2016 22:01   Ct Head Wo Contrast  Result Date: 03/31/2016 CLINICAL DATA:  Blurred vision, prior RIGHT side facial nerve surgery May 2017, prior temporal artery biopsy and ligation EXAM: CT HEAD WITHOUT CONTRAST TECHNIQUE: Contiguous axial images were obtained from the base of the skull through the vertex without intravenous contrast. COMPARISON:  None. FINDINGS: Prior  RIGHT occipital craniotomy. Radiopacity identified at the RIGHT lateral aspect of the pons, likely an implanted surgical object. Mild generalized atrophy. Normal ventricular morphology. No midline shift or mass effect. 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 No intracranial hemorrhage, mass lesion, or evidence acute infarction. No extra-axial fluid collection. Osseous structures unremarkable. IMPRESSION: Post RIGHT occipital craniotomy with likely implanted surgical object at the RIGHT lateral aspect of the pons. Small old appearing lacunar infarct at RIGHT  caudate head. No other definite intracranial abnormalities identified. Electronically Signed   By: Ulyses Southward M.D.   On: 03/31/2016 15:06     CBC  Recent Labs Lab 03/30/16 2103  WBC 8.7  HGB 13.1  HCT 38.2  PLT 249  MCV 88.5  MCH 30.5  MCHC 34.4  RDW 14.0  LYMPHSABS 0.3*  MONOABS 0.8  EOSABS 0.1  BASOSABS 0.0    Chemistries   Recent Labs Lab 03/30/16 2103  NA 134*  K 4.0  CL 98*  CO2 28  GLUCOSE 135*  BUN 10  CREATININE 0.81  CALCIUM 8.9  AST 22  ALT 13*  ALKPHOS 79  BILITOT 0.4   ------------------------------------------------------------------------------------------------------------------ estimated creatinine clearance is 56.4 mL/min (by C-G formula based on SCr of 0.81 mg/dL). ------------------------------------------------------------------------------------------------------------------  Recent Labs  03/31/16 0401  HGBA1C 5.4   ------------------------------------------------------------------------------------------------------------------ No results for input(s): CHOL, HDL, LDLCALC, TRIG, CHOLHDL, LDLDIRECT in the last 72 hours. ------------------------------------------------------------------------------------------------------------------  Recent Labs  03/31/16 0401  TSH 0.785   ------------------------------------------------------------------------------------------------------------------ No results for input(s): VITAMINB12, FOLATE, FERRITIN, TIBC, IRON, RETICCTPCT in the last 72 hours.  Coagulation profile No results for input(s): INR, PROTIME in the last 168 hours.  No results for input(s): DDIMER in the last 72 hours.  Cardiac Enzymes No results for input(s): CKMB, TROPONINI, MYOGLOBIN in the last 168 hours.  Invalid input(s): CK ------------------------------------------------------------------------------------------------------------------ Invalid input(s): POCBNP    Assessment & Plan   This is a 70 year old female  admitted for sepsis secondary to UTI. 1. Sepsis: Due to urinary tract infection Continue IV antibiotics with ceftriaxone 2. Urinary tract infection: Antibiotics as above. Norpramin per home regimen 3. Confusion: Improved mental status. There is still concern for possible neurologic cause for symptoms will obtain a CT scan of the head  4. Hypertension: Blood pressure borderline low losartan hold losartan 5. Asthma: Continue inhaled corticosteroid. DuoNeb as needed 6. 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 Uristat as needed 7. Depression: Continue Prozac and Klonopin 8. DVT prophylaxis: Lovenox 9. GI prophylaxis: Pantoprazole per home regimen       Code Status Orders        Start     Ordered   03/31/16 0213  Full code  Continuous     03/31/16 0212    Code Status History    Date Active Date Inactive Code Status Order ID Comments User Context   This patient has a current code status but no historical code status.           None DVT Prophylaxis  Lovenox   Lab Results  Component Value Date   PLT 249 03/30/2016     Time Spent Greater than 50% of time spent in care coordination and counseling patient regarding the condition and plan of care.   Auburn Bilberry M.D on 04/01/2016 at 2:20 PM  Between 7am to 6pm - Pager - [phone]  After 6pm go to [email or web address] - password [email or web address] [facility]  [facility]   Office  [phone]

## 2016-04-02 MED ORDER — CIPROFLOXACIN HCL 500 MG PO TABS: 500.0000 mg | ORAL_TABLET | Freq: Two times a day (BID) | ORAL | 0 refills | Status: AC

## 2016-04-02 NOTE — Discharge Instructions (Signed)

## 2016-04-02 NOTE — Progress Notes (Signed)
Pt being discharged, discharge instructions reviewed with pt and husband, states understanding, pt with no noted complaints, no distress or discomfort noted

## 2016-04-02 NOTE — Discharge Summary (Signed)
Brooke [name], 70 y.o., DOB October 06, 1945, MRN [id number]. Admission date: 03/30/2016 Discharge Date 04/02/2016 Primary MD Earnestine Leys, MD Admitting Physician Arnaldo Natal, MD  Admission Diagnosis  Tachycardia [R00.0] UTI (lower urinary tract infection) [N39.0] Acute encephalopathy [G93.40] Severe sepsis with acute organ dysfunction (HCC) [A41.9, R65.20]  Discharge Diagnosis   Active Problems:   UTI (lower urinary tract infection)   Sepsis due to Escherichia coli  Asthma        Hospital Course The patient with past medical history of asthma presents to the emergency department due to confusion and agitation. The patient states that she has had urinary urgency, chills and abdominal pain for the last 4 days but could not be evaluated by her primary care doctor. She was seen in the emergency department at [facility] and given oral antibiotics of which she took 1 dose prior to her family noticing that the patient was hallucinating. Patient was noted to have UTI and felt to be septic. She was admitted for further evaluation and therapy. She did undergo a CT scan of the head which showed chronic changes related to her previous surgery also a small old lacunar infarct. Patient's blood culture did show Escherichia coli which was pansensitive. She was treated with antibiotics IV now will be be on oral anabiotic's.              Consults  None  Significant Tests:  See full reports for all details     Dg Chest 1 View  Result Date: 03/30/2016 CLINICAL DATA:  Initial evaluation for acute shortness of breath, code sepsis. EXAM: CHEST 1 VIEW COMPARISON:  Prior radiograph from 05/24/2012. FINDINGS: Examination is somewhat limited due to patient positioning. Allowing for patient rotation, transverse heart size is stable, and remains within normal limits. Mediastinal silhouette within normal limits. Lungs are hypoinflated with elevation left hemidiaphragm, similar to previous.  Associated left basilar atelectasis. No other focal infiltrates. No pulmonary edema or pleural effusion. No pneumothorax. No acute osseus abnormality. IMPRESSION: 1. Elevation of the left hemidiaphragm with associated left basilar atelectasis. 2. No other active cardiopulmonary disease identified. Electronically Signed   By: Rise Mu M.D.   On: 03/30/2016 22:01   Ct Head Wo Contrast  Result Date: 03/31/2016 CLINICAL DATA:  Blurred vision, prior RIGHT side facial nerve surgery May 2017, prior temporal artery biopsy and ligation EXAM: CT HEAD WITHOUT CONTRAST TECHNIQUE: Contiguous axial images were obtained from the base of the skull through the vertex without intravenous contrast. COMPARISON:  None. FINDINGS: Prior RIGHT occipital craniotomy. Radiopacity identified at the RIGHT lateral aspect of the pons, likely an implanted surgical object. Mild generalized atrophy. Normal ventricular morphology. No midline shift or mass effect. 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 No intracranial hemorrhage, mass lesion, or evidence acute infarction. No extra-axial fluid collection. Osseous structures unremarkable. IMPRESSION: Post RIGHT occipital craniotomy with likely implanted surgical object at the RIGHT lateral aspect of the pons. Small old appearing lacunar infarct at RIGHT caudate head. No other definite intracranial abnormalities identified. Electronically Signed   By: Ulyses Southward M.D.   On: 03/31/2016 15:06       Today   Subjective:   Brooke [name]  patient feels well once to go home  Objective:   Blood pressure (!) 153/79, pulse 76, temperature 98 F (36.7 C), temperature source Oral, resp. rate 17, height 5\' 2"  (1.575 m), weight 137 lb 9.6 oz (62.4 kg), SpO2 93 %.  .  Intake/Output Summary (Last 24  hours) at 04/02/16 1253 Last data filed at 04/02/16 0900  Gross per 24 hour  Intake             1341 ml  Output                0 ml  Net             1341 ml    Exam VITAL  SIGNS: Blood pressure (!) 153/79, pulse 76, temperature 98 F (36.7 C), temperature source Oral, resp. rate 17, height 5\' 2"  (1.575 m), weight 137 lb 9.6 oz (62.4 kg), SpO2 93 %.  GENERAL:  70 y.o.-year-old patient lying in the bed with no acute distress.  EYES: Pupils equal, round, reactive to light and accommodation. No scleral icterus. Extraocular muscles intact.  HEENT: Head atraumatic, normocephalic. Oropharynx and nasopharynx clear.  NECK:  Supple, no jugular venous distention. No thyroid enlargement, no tenderness.  LUNGS: Normal breath sounds bilaterally, no wheezing, rales,rhonchi or crepitation. No use of accessory muscles of respiration.  CARDIOVASCULAR: S1, S2 normal. No murmurs, rubs, or gallops.  ABDOMEN: Soft, nontender, nondistended. Bowel sounds present. No organomegaly or mass.  EXTREMITIES: No pedal edema, cyanosis, or clubbing.  NEUROLOGIC: Cranial nerves II through XII are intact. Muscle strength 5/5 in all extremities. Sensation intact. Gait not checked.  PSYCHIATRIC: The patient is alert and oriented x 3.  SKIN: No obvious rash, lesion, or ulcer.   Data Review     CBC w Diff:  Lab Results  Component Value Date   WBC 8.7 03/30/2016   HGB 13.1 03/30/2016   HCT 38.2 03/30/2016   PLT 249 03/30/2016   LYMPHOPCT 4 03/30/2016   MONOPCT 9 03/30/2016   EOSPCT 1 03/30/2016   BASOPCT 0 03/30/2016   CMP:  Lab Results  Component Value Date   NA 134 (L) 03/30/2016   K 4.0 03/30/2016   CL 98 (L) 03/30/2016   CO2 28 03/30/2016   BUN 10 03/30/2016   CREATININE 0.81 03/30/2016   PROT 7.4 03/30/2016   ALBUMIN 3.9 03/30/2016   BILITOT 0.4 03/30/2016   ALKPHOS 79 03/30/2016   AST 22 03/30/2016   ALT 13 (L) 03/30/2016  .  Micro Results Recent Results (from the past 240 hour(s))  Blood Culture (routine x 2)     Status: Abnormal (Preliminary result)   Collection Time: 03/30/16  9:53 PM  Result Value Ref Range Status   Specimen Description BLOOD BLOOD LEFT FOREARM   Final   Special Requests BOTTLES DRAWN AEROBIC AND ANAEROBIC 10CC  Final   Culture  Setup Time   Final    GRAM NEGATIVE RODS IN BOTH AEROBIC AND ANAEROBIC BOTTLES CRITICAL RESULT CALLED TO, READ BACK BY AND VERIFIED WITH: JASON ROBBINS AT 1420 03/31/16 SDR Performed at [facility]    Culture ESCHERICHIA COLI (A)  Final   Report Status PENDING  Incomplete   Organism ID, Bacteria ESCHERICHIA COLI  Final      Susceptibility   Escherichia coli - MIC*    AMPICILLIN <=2 SENSITIVE Sensitive     CEFAZOLIN <=4 SENSITIVE Sensitive     CEFEPIME <=1 SENSITIVE Sensitive     CEFTAZIDIME <=1 SENSITIVE Sensitive     CEFTRIAXONE <=1 SENSITIVE Sensitive     CIPROFLOXACIN <=0.25 SENSITIVE Sensitive     GENTAMICIN <=1 SENSITIVE Sensitive     IMIPENEM <=0.25 SENSITIVE Sensitive     TRIMETH/SULFA <=20 SENSITIVE Sensitive     AMPICILLIN/SULBACTAM <=2 SENSITIVE Sensitive     PIP/TAZO <=  4 SENSITIVE Sensitive     Extended ESBL NEGATIVE Sensitive     * ESCHERICHIA COLI  Blood Culture (routine x 2)     Status: None (Preliminary result)   Collection Time: 03/30/16  9:53 PM  Result Value Ref Range Status   Specimen Description BLOOD RIGHT FATTY CASTS  Final   Special Requests BOTTLES DRAWN AEROBIC AND ANAEROBIC 10CC  Final   Culture NO GROWTH 2 DAYS  Final   Report Status PENDING  Incomplete  Urine culture     Status: Abnormal   Collection Time: 03/30/16  9:53 PM  Result Value Ref Range Status   Specimen Description URINE, RANDOM  Final   Special Requests NONE  Final   Culture (A)  Final    <10,000 COLONIES/mL INSIGNIFICANT GROWTH [facility]    Report Status 04/01/2016 FINAL  Final  Blood Culture ID Panel (Reflexed)     Status: Abnormal   Collection Time: 03/30/16  9:53 PM  Result Value Ref Range Status   Enterococcus species NOT DETECTED NOT DETECTED Final   Listeria monocytogenes NOT DETECTED NOT DETECTED Final   Staphylococcus species NOT DETECTED NOT DETECTED Final    Staphylococcus aureus NOT DETECTED NOT DETECTED Final   Streptococcus species NOT DETECTED NOT DETECTED Final   Streptococcus agalactiae NOT DETECTED NOT DETECTED Final   Streptococcus pneumoniae NOT DETECTED NOT DETECTED Final   Streptococcus pyogenes NOT DETECTED NOT DETECTED Final   Acinetobacter baumannii NOT DETECTED NOT DETECTED Final   Enterobacteriaceae species DETECTED (A) NOT DETECTED Final    Comment: CRITICAL RESULT CALLED TO, READ BACK BY AND VERIFIED WITH: JASON ROBBINS AT 1420 03/31/16 SDR    Enterobacter cloacae complex NOT DETECTED NOT DETECTED Final   Escherichia coli DETECTED (A) NOT DETECTED Final    Comment: CRITICAL RESULT CALLED TO, READ BACK BY AND VERIFIED WITH: JASON ROBBINS AT 1420 03/31/16 SDR    Klebsiella oxytoca NOT DETECTED NOT DETECTED Final   Klebsiella pneumoniae NOT DETECTED NOT DETECTED Final   Proteus species NOT DETECTED NOT DETECTED Final   Serratia marcescens NOT DETECTED NOT DETECTED Final   Carbapenem resistance NOT DETECTED NOT DETECTED Final   Haemophilus influenzae NOT DETECTED NOT DETECTED Final   Neisseria meningitidis NOT DETECTED NOT DETECTED Final   Pseudomonas aeruginosa NOT DETECTED NOT DETECTED Final   Candida albicans NOT DETECTED NOT DETECTED Final   Candida glabrata NOT DETECTED NOT DETECTED Final   Candida krusei NOT DETECTED NOT DETECTED Final   Candida parapsilosis NOT DETECTED NOT DETECTED Final   Candida tropicalis NOT DETECTED NOT DETECTED Final        Code Status Orders        Start     Ordered   03/31/16 0213  Full code  Continuous     03/31/16 0212    Code Status History    Date Active Date Inactive Code Status Order ID Comments User Context   This patient has a current code status but no historical code status.          Follow-up Information    Earnestine LeysKIZER,JOHN S, MD Follow up in 7 day(s).   Specialty:  Geriatric Medicine Contact information: [id number] [facility] [place] [id number] [phone]           Discharge Medications     Medication List    STOP taking these medications   acetaminophen 325 MG tablet Commonly known as:  TYLENOL   nitrofurantoin (macrocrystal-monohydrate)  100 MG capsule Commonly known as:  MACROBID     TAKE these medications   albuterol 108 (90 Base) MCG/ACT inhaler Commonly known as:  PROVENTIL HFA;VENTOLIN HFA Inhale 2 puffs into the lungs every 6 (six) hours as needed for wheezing or shortness of breath.   budesonide-formoterol 80-4.5 MCG/ACT inhaler Commonly known as:  SYMBICORT Inhale 2 puffs into the lungs 2 (two) times daily.   butalbital-acetaminophen-caffeine 50-325-40 MG tablet Commonly known as:  FIORICET, ESGIC Take 1 tablet by mouth 2 (two) times daily as needed for headache.   butalbital-aspirin-caffeine 50-325-40 MG capsule Commonly known as:  FIORINAL Take 1 capsule by mouth every 6 (six) hours as needed for headache.   ciprofloxacin 500 MG tablet Commonly known as:  CIPRO Take 1 tablet (500 mg total) by mouth 2 (two) times daily.   clonazePAM 0.5 MG tablet Commonly known as:  KLONOPIN Take 0.5 mg by mouth 2 (two) times daily as needed for anxiety.   desipramine 75 MG tablet Commonly known as:  NORPRAMIN Take 37.5 mg by mouth at bedtime.   ferrous sulfate 325 (65 FE) MG tablet Take 325 mg by mouth 3 (three) times daily with meals.   fluocinonide ointment 0.05 % Commonly known as:  LIDEX Apply 1 application topically at bedtime. Apply to finger tips   FLUoxetine 40 MG capsule Commonly known as:  PROZAC Take 40 mg by mouth daily.   fluticasone 50 MCG/ACT nasal spray Commonly known as:  FLONASE Place 1 spray into both nostrils daily.   gabapentin 300 MG capsule Commonly known as:  NEURONTIN Take 300 mg by mouth 3 (three) times daily.   HYDROcodone-acetaminophen 5-325 MG tablet Commonly known as:  NORCO/VICODIN Take 1 tablet by mouth every 8  (eight) hours as needed for pain.   losartan 25 MG tablet Commonly known as:  COZAAR Take 50 mg by mouth daily.   meclizine 25 MG tablet Commonly known as:  ANTIVERT Take 25 mg by mouth 3 (three) times daily as needed for dizziness.   metoCLOPramide 10 MG tablet Commonly known as:  REGLAN Take 10 mg by mouth at bedtime as needed for nausea.   montelukast 10 MG tablet Commonly known as:  SINGULAIR Take 10 mg by mouth daily.   omeprazole 20 MG capsule Commonly known as:  PRILOSEC Take 20 mg by mouth daily.   promethazine 25 MG tablet Commonly known as:  PHENERGAN Take 25 mg by mouth every 6 (six) hours as needed for nausea or vomiting.   traZODone 100 MG tablet Commonly known as:  DESYREL Take 100 mg by mouth at bedtime.          Total Time in preparing paper work, data evaluation and todays exam - 35 minutes  Auburn Bilberry M.D on 04/02/2016 at 12:53 PM  [facility]   Office  [phone]

## 2016-04-02 NOTE — Progress Notes (Signed)
[facility] [place] at [facility]        Brooke MinusBrenda [name] was admitted to the Hospital on 03/30/2016 and Discharged  04/02/2016 and should be excused from work/school   for 7  days starting 03/30/2016 , may return to work/school without any restrictions.  Call Brooke BilberryShreyang [name] MD with questions.  Brooke BilberryPATEL, Brooke Kohrs M.D on 04/02/2016,at 11:42 AM  [facility] [place] [facility]    Office  [phone]

## 2016-04-04 LAB — CULTURE, BLOOD (ROUTINE X 2)

## 2016-04-05 LAB — CULTURE, BLOOD (ROUTINE X 2): CULTURE: NO GROWTH

## 2016-05-11 ENCOUNTER — Observation Stay
Admission: EM | Admit: 2016-05-11 | Discharge: 2016-05-12 | Disposition: A | Discharge status: Home / Self Care | Payer: Medicare Other | Attending: Internal Medicine | Admitting: Internal Medicine

## 2016-05-11 ENCOUNTER — Emergency Department: Payer: Medicare Other

## 2016-05-11 DIAGNOSIS — F329 Major depressive disorder, single episode, unspecified: Secondary | ICD-10-CM | POA: Diagnosis not present

## 2016-05-11 DIAGNOSIS — R059 Cough, unspecified: Secondary | ICD-10-CM

## 2016-05-11 DIAGNOSIS — I10 Essential (primary) hypertension: Secondary | ICD-10-CM | POA: Insufficient documentation

## 2016-05-11 DIAGNOSIS — E871 Hypo-osmolality and hyponatremia: Secondary | ICD-10-CM | POA: Diagnosis not present

## 2016-05-11 DIAGNOSIS — Z79899 Other long term (current) drug therapy: Secondary | ICD-10-CM | POA: Diagnosis not present

## 2016-05-11 DIAGNOSIS — J189 Pneumonia, unspecified organism: Secondary | ICD-10-CM | POA: Diagnosis not present

## 2016-05-11 DIAGNOSIS — A419 Sepsis, unspecified organism: Secondary | ICD-10-CM | POA: Diagnosis present

## 2016-05-11 DIAGNOSIS — R509 Fever, unspecified: Secondary | ICD-10-CM | POA: Diagnosis present

## 2016-05-11 DIAGNOSIS — J45909 Unspecified asthma, uncomplicated: Secondary | ICD-10-CM | POA: Diagnosis not present

## 2016-05-11 DIAGNOSIS — Z90710 Acquired absence of both cervix and uterus: Secondary | ICD-10-CM | POA: Insufficient documentation

## 2016-05-11 DIAGNOSIS — F419 Anxiety disorder, unspecified: Secondary | ICD-10-CM | POA: Diagnosis not present

## 2016-05-11 DIAGNOSIS — R05 Cough: Secondary | ICD-10-CM

## 2016-05-11 LAB — URINALYSIS COMPLETE WITH MICROSCOPIC (ARMC ONLY)
Bacteria, UA: NONE SEEN
Bilirubin Urine: NEGATIVE
Glucose, UA: NEGATIVE mg/dL
Hgb urine dipstick: NEGATIVE
Ketones, ur: NEGATIVE mg/dL
Leukocytes, UA: NEGATIVE
Nitrite: NEGATIVE
PROTEIN: NEGATIVE mg/dL
SPECIFIC GRAVITY, URINE: 1.011 (ref 1.005–1.030)
Squamous Epithelial / LPF: NONE SEEN
pH: 7 (ref 5.0–8.0)

## 2016-05-11 LAB — TROPONIN I: Troponin I: <0.03 ng/mL (ref <0.03)

## 2016-05-11 LAB — CBC WITH DIFFERENTIAL/PLATELET
BASOS ABS: 0 10*3/uL (ref 0–0.1)
Basophils Relative: 0 %
Eosinophils Absolute: 0.1 10*3/uL (ref 0–0.7)
Eosinophils Relative: 1 %
HEMATOCRIT: 37.2 % (ref 35.0–47.0)
HEMOGLOBIN: 13 g/dL (ref 12.0–16.0)
LYMPHS PCT: 3 %
Lymphs Abs: 0.5 10*3/uL — ABNORMAL LOW (ref 1.0–3.6)
MCH: 31 pg (ref 26.0–34.0)
MCHC: 34.9 g/dL (ref 32.0–36.0)
MCV: 88.8 fL (ref 80.0–100.0)
Monocytes Absolute: 0.7 10*3/uL (ref 0.2–0.9)
Monocytes Relative: 4 %
NEUTROS ABS: 15 10*3/uL — AB (ref 1.4–6.5)
Neutrophils Relative %: 92 %
PLATELETS: 279 10*3/uL (ref 150–440)
RBC: 4.19 MIL/uL (ref 3.80–5.20)
RDW: 13.9 % (ref 11.5–14.5)
WBC: 16.3 10*3/uL — AB (ref 3.6–11.0)

## 2016-05-11 LAB — INFLUENZA PANEL BY PCR (TYPE A & B)
H1N1FLUPCR: NOT DETECTED
Influenza A By PCR: NEGATIVE
Influenza B By PCR: NEGATIVE

## 2016-05-11 LAB — TSH: TSH: 2.697 u[IU]/mL (ref 0.350–4.500)

## 2016-05-11 LAB — LACTIC ACID, PLASMA
Lactic Acid, Venous: 2 mmol/L (ref 0.5–1.9)
Lactic Acid, Venous: 1.1 mmol/L (ref 0.5–1.9)

## 2016-05-11 LAB — COMPREHENSIVE METABOLIC PANEL
ALBUMIN: 3.8 g/dL (ref 3.5–5.0)
ALT: 13 U/L — AB (ref 14–54)
AST: 30 U/L (ref 15–41)
Alkaline Phosphatase: 62 U/L (ref 38–126)
Anion gap: 6 (ref 5–15)
BUN: 9 mg/dL (ref 6–20)
CHLORIDE: 99 mmol/L — AB (ref 101–111)
CO2: 27 mmol/L (ref 22–32)
CREATININE: 0.68 mg/dL (ref 0.44–1.00)
Calcium: 8.8 mg/dL — ABNORMAL LOW (ref 8.9–10.3)
GFR calc non Af Amer: >60 mL/min (ref >60)
GLUCOSE: 141 mg/dL — AB (ref 65–99)
Potassium: 3.9 mmol/L (ref 3.5–5.1)
SODIUM: 132 mmol/L — AB (ref 135–145)
TOTAL PROTEIN: 7 g/dL (ref 6.5–8.1)
Total Bilirubin: 0.5 mg/dL (ref 0.3–1.2)

## 2016-05-11 LAB — PROCALCITONIN: Procalcitonin: <0.1 ng/mL

## 2016-05-11 MED ORDER — SODIUM CHLORIDE 0.9% FLUSH: 3.0000 mL | Freq: Two times a day (BID) | INTRAVENOUS | Status: DC

## 2016-05-11 MED ORDER — METOCLOPRAMIDE HCL 10 MG PO TABS: 10.0000 mg | ORAL_TABLET | Freq: Every evening | ORAL | Status: DC | PRN

## 2016-05-11 MED ORDER — VANCOMYCIN HCL IN DEXTROSE 1-5 GM/200ML-% IV SOLN
1000.0000 mg | Freq: Once | INTRAVENOUS | Status: AC
  Administered 2016-05-11: 1000 mg via INTRAVENOUS
  Filled 2016-05-11: qty 200

## 2016-05-11 MED ORDER — ALBUTEROL SULFATE (2.5 MG/3ML) 0.083% IN NEBU: 2.5000 mg | INHALATION_SOLUTION | RESPIRATORY_TRACT | Status: DC | PRN

## 2016-05-11 MED ORDER — MECLIZINE HCL 25 MG PO TABS: 25.0000 mg | ORAL_TABLET | Freq: Three times a day (TID) | ORAL | Status: DC | PRN

## 2016-05-11 MED ORDER — PANTOPRAZOLE SODIUM 40 MG PO TBEC
40.0000 mg | DELAYED_RELEASE_TABLET | Freq: Every day | ORAL | Status: DC
  Administered 2016-05-11 – 2016-05-12 (×2): 40 mg via ORAL
  Filled 2016-05-11 (×2): qty 1

## 2016-05-11 MED ORDER — HYDROCODONE-ACETAMINOPHEN 5-325 MG PO TABS
1.0000 | ORAL_TABLET | Freq: Three times a day (TID) | ORAL | Status: DC | PRN
  Administered 2016-05-11: 1 via ORAL
  Filled 2016-05-11: qty 1

## 2016-05-11 MED ORDER — ACETAMINOPHEN 325 MG PO TABS
650.0000 mg | ORAL_TABLET | Freq: Once | ORAL | Status: DC
  Filled 2016-05-11: qty 2

## 2016-05-11 MED ORDER — FLUOXETINE HCL 20 MG PO CAPS
40.0000 mg | ORAL_CAPSULE | Freq: Every day | ORAL | Status: DC
  Administered 2016-05-11 – 2016-05-12 (×2): 40 mg via ORAL
  Filled 2016-05-11 (×2): qty 2

## 2016-05-11 MED ORDER — MOMETASONE FURO-FORMOTEROL FUM 100-5 MCG/ACT IN AERO
2.0000 | INHALATION_SPRAY | Freq: Two times a day (BID) | RESPIRATORY_TRACT | Status: DC
  Administered 2016-05-11 – 2016-05-12 (×2): 2 via RESPIRATORY_TRACT
  Filled 2016-05-11: qty 8.8

## 2016-05-11 MED ORDER — DEXTROSE 5 % IV SOLN
2.0000 g | Freq: Once | INTRAVENOUS | Status: AC
  Administered 2016-05-11: 2 g via INTRAVENOUS
  Filled 2016-05-11: qty 2

## 2016-05-11 MED ORDER — LOSARTAN POTASSIUM 50 MG PO TABS
50.0000 mg | ORAL_TABLET | Freq: Every day | ORAL | Status: DC
  Administered 2016-05-11 – 2016-05-12 (×2): 50 mg via ORAL
  Filled 2016-05-11 (×2): qty 1

## 2016-05-11 MED ORDER — VANCOMYCIN HCL IN DEXTROSE 1-5 GM/200ML-% IV SOLN
1000.0000 mg | INTRAVENOUS | Status: DC
  Filled 2016-05-11: qty 200

## 2016-05-11 MED ORDER — ONDANSETRON HCL 4 MG/2ML IJ SOLN
4.0000 mg | Freq: Four times a day (QID) | INTRAMUSCULAR | Status: DC | PRN
  Administered 2016-05-11 (×2): 4 mg via INTRAVENOUS
  Filled 2016-05-11 (×2): qty 2

## 2016-05-11 MED ORDER — MONTELUKAST SODIUM 10 MG PO TABS
10.0000 mg | ORAL_TABLET | Freq: Every day | ORAL | Status: DC
  Administered 2016-05-11 – 2016-05-12 (×2): 10 mg via ORAL
  Filled 2016-05-11 (×2): qty 1

## 2016-05-11 MED ORDER — ACETAMINOPHEN 325 MG PO TABS: 650.0000 mg | ORAL_TABLET | Freq: Four times a day (QID) | ORAL | Status: DC | PRN

## 2016-05-11 MED ORDER — TRAZODONE HCL 100 MG PO TABS
100.0000 mg | ORAL_TABLET | Freq: Every day | ORAL | Status: DC
  Administered 2016-05-11: 100 mg via ORAL
  Filled 2016-05-11: qty 1

## 2016-05-11 MED ORDER — FLUOCINONIDE 0.05 % EX OINT
1.0000 "application " | TOPICAL_OINTMENT | Freq: Every day | CUTANEOUS | Status: DC
  Filled 2016-05-11: qty 15

## 2016-05-11 MED ORDER — CLONAZEPAM 0.5 MG PO TABS: 0.5000 mg | ORAL_TABLET | Freq: Two times a day (BID) | ORAL | Status: DC | PRN

## 2016-05-11 MED ORDER — FERROUS SULFATE 325 (65 FE) MG PO TABS
325.0000 mg | ORAL_TABLET | Freq: Three times a day (TID) | ORAL | Status: DC
  Administered 2016-05-11: 325 mg via ORAL
  Filled 2016-05-11 (×2): qty 1

## 2016-05-11 MED ORDER — GABAPENTIN 300 MG PO CAPS
300.0000 mg | ORAL_CAPSULE | Freq: Three times a day (TID) | ORAL | Status: DC
  Administered 2016-05-11 – 2016-05-12 (×4): 300 mg via ORAL
  Filled 2016-05-11 (×4): qty 1

## 2016-05-11 MED ORDER — DOCUSATE SODIUM 100 MG PO CAPS
100.0000 mg | ORAL_CAPSULE | Freq: Two times a day (BID) | ORAL | Status: DC
  Administered 2016-05-11: 100 mg via ORAL
  Filled 2016-05-11 (×2): qty 1

## 2016-05-11 MED ORDER — BUTALBITAL-APAP-CAFFEINE 50-325-40 MG PO TABS
1.0000 | ORAL_TABLET | Freq: Two times a day (BID) | ORAL | Status: DC | PRN
  Administered 2016-05-11: 1 via ORAL
  Filled 2016-05-11: qty 1

## 2016-05-11 MED ORDER — DEXTROSE 5 % IV SOLN
2.0000 g | Freq: Two times a day (BID) | INTRAVENOUS | Status: DC
  Filled 2016-05-11 (×2): qty 2

## 2016-05-11 MED ORDER — ACETAMINOPHEN 650 MG RE SUPP: 650.0000 mg | Freq: Four times a day (QID) | RECTAL | Status: DC | PRN

## 2016-05-11 MED ORDER — FLUTICASONE PROPIONATE 50 MCG/ACT NA SUSP
1.0000 | Freq: Every day | NASAL | Status: DC
  Administered 2016-05-11 – 2016-05-12 (×2): 1 via NASAL
  Filled 2016-05-11: qty 16

## 2016-05-11 MED ORDER — SODIUM CHLORIDE 0.9 % IV SOLN
INTRAVENOUS | Status: DC
  Administered 2016-05-11: 125 mL/h via INTRAVENOUS

## 2016-05-11 MED ORDER — DESIPRAMINE HCL 25 MG PO TABS
37.5000 mg | ORAL_TABLET | Freq: Every day | ORAL | Status: DC
  Administered 2016-05-11: 37.5 mg via ORAL
  Filled 2016-05-11 (×2): qty 2

## 2016-05-11 MED ORDER — ONDANSETRON HCL 4 MG PO TABS: 4.0000 mg | ORAL_TABLET | Freq: Four times a day (QID) | ORAL | Status: DC | PRN

## 2016-05-11 MED ORDER — ENOXAPARIN SODIUM 40 MG/0.4ML ~~LOC~~ SOLN
40.0000 mg | SUBCUTANEOUS | Status: DC
  Administered 2016-05-11: 40 mg via SUBCUTANEOUS
  Filled 2016-05-11: qty 0.4

## 2016-05-11 MED ORDER — SODIUM CHLORIDE 0.9 % IV BOLUS (SEPSIS)
1000.0000 mL | Freq: Once | INTRAVENOUS | Status: AC
  Administered 2016-05-11: 1000 mL via INTRAVENOUS

## 2016-05-11 MED ORDER — NITROGLYCERIN 2 % TD OINT
0.5000 [in_us] | TOPICAL_OINTMENT | Freq: Once | TRANSDERMAL | Status: AC
  Administered 2016-05-11: 0.5 [in_us] via TOPICAL
  Filled 2016-05-11: qty 1

## 2016-05-11 NOTE — Progress Notes (Signed)
[place] [facility] [place] NAME: Brooke [name]    MR#:  [id number]  DATE OF BIRTH:  02/06/1946  SUBJECTIVE:  CHIEF COMPLAINT:   Chief Complaint  Patient presents with  . Cough  . Dysuria  . Fever  feels weak, "When can I go Home?" - Afebrile while here REVIEW OF SYSTEMS:  Review of Systems  Constitutional: Positive for chills, fever and malaise/fatigue. Negative for weight loss.  HENT: Positive for hearing loss. Negative for nosebleeds and sore throat.   Eyes: Negative for blurred vision.  Respiratory: Positive for cough. Negative for shortness of breath and wheezing.   Cardiovascular: Positive for chest pain. Negative for orthopnea, leg swelling and PND.  Gastrointestinal: Negative for abdominal pain, constipation, diarrhea, heartburn, nausea and vomiting.  Genitourinary: Negative for dysuria and urgency.  Musculoskeletal: Negative for back pain.  Skin: Negative for rash.  Neurological: Positive for weakness and headaches. Negative for dizziness, speech change and focal weakness.  Endo/Heme/Allergies: Does not bruise/bleed easily.  Psychiatric/Behavioral: Positive for depression. The patient is nervous/anxious.     DRUG ALLERGIES:   Allergies  Allergen Reactions  . Sulfa Antibiotics Rash   VITALS:  Blood pressure 137/87, pulse 81, temperature 97.8 F (36.6 C), temperature source Oral, resp. rate 20, height _0  (1.575 m), weight 62.8 kg (138 lb 8 oz), SpO2 98 %. PHYSICAL EXAMINATION:  Physical Exam  Constitutional: She is oriented to person, place, and time and well-developed, well-nourished, and in no distress.  HENT:  Head: Normocephalic and atraumatic.  Eyes: Conjunctivae and EOM are normal. Pupils are equal, round, and reactive to light.  Neck: Normal range of motion. Neck supple. No tracheal deviation present. No thyromegaly present.  Cardiovascular: Normal rate, regular rhythm and normal heart sounds.   Pulmonary/Chest:  Effort normal and breath sounds normal. No respiratory distress. She has no wheezes. She exhibits no tenderness.  Abdominal: Soft. Bowel sounds are normal. She exhibits no distension. There is no tenderness.  Musculoskeletal: Normal range of motion.  Neurological: She is alert and oriented to person, place, and time. No cranial nerve deficit.  Skin: Skin is warm and dry. No rash noted.  Psychiatric: Mood and affect normal.   LABORATORY PANEL:   CBC  Recent Labs Lab 05/11/16 0156  WBC 16.3*  HGB 13.0  HCT 37.2  PLT 279   ------------------------------------------------------------------------------------------------------------------ Chemistries   Recent Labs Lab 05/11/16 0156  NA 132*  K 3.9  CL 99*  CO2 27  GLUCOSE 141*  BUN 9  CREATININE 0.68  CALCIUM 8.8*  AST 30  ALT 13*  ALKPHOS 62  BILITOT 0.5   RADIOLOGY:  Dg Chest Port 1 View  Result Date: 05/11/2016 CLINICAL DATA:  Acute onset of chest congestion and nonproductive cough. Shortness of breath. Initial encounter. EXAM: PORTABLE CHEST 1 VIEW COMPARISON:  Chest radiograph performed 03/30/2016 FINDINGS: There is elevation of the left hemidiaphragm, with left basilar atelectasis. No definite pleural effusion or pneumothorax is seen. The cardiomediastinal silhouette is borderline normal in size. No acute osseous abnormalities are identified. IMPRESSION: Elevation of the left hemidiaphragm, with left basilar atelectasis. Electronically Signed   By: Garald Balding M.D.   On: 05/11/2016 02:23   ASSESSMENT AND PLAN:  This is a 70 year old female admitted for sepsis.  1. Sepsis: present on admission. met criteria via leukocytosis as well as fever. Etiology potentially pneumonia. Differential diagnosis includes urinary tract infection although urinalysis is negative. Nonetheless, continue empiric treatment with cefepime and vancomycin. She  is hemodynamically stable. Neg blood cultures till now. - We will watch fever curve -  Repeat chest x-ray in the morning - Check procalcitonin to decide need for antibiotics - This could be viral infection - We will check flu swab, she has had a influenza shot in September of this year at her primary care physician's office  2. Asthma: can be causing chest pressure.  Negative serial cardiac biomarkers - discontinue telemetry. Continue inhaled corticosteroid. Albuterol as needed for shortness of breath.  3. Hypertension: Controlled; continue valsartan  4. Depression and anxiety: Continue fluoxetine as well as Klonopin and trazodone  5.  Hyponatremia - Monitor with IV hydration, could have been due to dehydration  6. GI prophylaxis: Pantoprazole per home regimen DVT prophylaxis: Lovenox    Discontinue telemetry and IV fluids, patient is eating and drinking well.  Recommend ambulation  All the records are reviewed and case discussed with Care Management/Social Worker. Management plans discussed with the patient, nursing,  family and they are in agreement.  CODE STATUS: Full code  TOTAL TIME TAKING CARE OF THIS PATIENT: 35 minutes.   More than 50% of the time was spent in counseling/coordination of care: YES  POSSIBLE D/C IN 1-2 DAYS, DEPENDING ON CLINICAL CONDITION.   [facility], Suresh Audi M.D on 05/11/2016 at 8:00 AM  Between 7am to 6pm - Pager - [phone]  After 6pm go to [email or web address] - Proofreader  [facility]  Office  [phone]  CC: Primary care physician; Harlow Ohms, MD  Note: This dictation was prepared with Dragon dictation along with smaller phrase technology. Any transcriptional errors that result from this process are unintentional.

## 2016-05-11 NOTE — Care Management Obs Status (Signed)
MEDICARE OBSERVATION STATUS NOTIFICATION   Patient Details  Name: Brooke DiamondBrenda G Coval MRN: [id number] Date of Birth: 07/16/1946   Medicare Observation Status Notification Given:  Yes    Collie SiadAngela Verdean Murin, RN 05/11/2016, 9:40 AM

## 2016-05-11 NOTE — H&P (Signed)
Brooke [name] is an 70 y.o. female.   Chief Complaint: Fever HPI: The patient with past medical history of asthma and recurrent urinary tract infection presents to the emergency department complaining of fever. She states that she awoke her husband tonight complaining of chills. Her husband notes that she felt hot to touch. The patient has had a slight cough for a few days as well as some urinary urgency. She admits to feeling a tightness over her right chest. She denies any radiating chest pain, nausea or diaphoresis. In the emergency department the patient was found to be febrile to 101.13F and she admitted to some suprapubic tenderness on physical exam. However, urinalysis was negative for infection. Chest x-ray was equivocal for pneumonia. However, the patient had a leukocytosis as well as hyponatremia and lactic acidosis which brought to the emergency department staff to initiate septic protocol and call the hospitalist service for admission.  Past Medical History:  Diagnosis Date  . Asthma     Past Surgical History:  Procedure Laterality Date  . PARTIAL HYSTERECTOMY    . TEMPORAL ARTERY BIOPSY / LIGATION      Family History  Problem Relation Age of Onset  . CAD Father   . Colon cancer Father    Social History:  reports that she has never smoked. She has never used smokeless tobacco. Her alcohol and drug histories are not on file.  Allergies:  Allergies  Allergen Reactions  . Sulfa Antibiotics Rash    Medications Prior to Admission  Medication Sig Dispense Refill  . albuterol (PROVENTIL HFA;VENTOLIN HFA) 108 (90 Base) MCG/ACT inhaler Inhale 2 puffs into the lungs every 6 (six) hours as needed for wheezing or shortness of breath.    . budesonide-formoterol (SYMBICORT) 80-4.5 MCG/ACT inhaler Inhale 2 puffs into the lungs 2 (two) times daily.    . butalbital-acetaminophen-caffeine (FIORICET, ESGIC) 50-325-40 MG tablet Take 1 tablet by mouth 2 (two) times daily as needed for  headache.    . clonazePAM (KLONOPIN) 0.5 MG tablet Take 0.5 mg by mouth 2 (two) times daily as needed for anxiety.     Marland Kitchen desipramine (NORPRAMIN) 75 MG tablet Take 37.5 mg by mouth at bedtime.     . ferrous sulfate 325 (65 FE) MG tablet Take 325 mg by mouth 3 (three) times daily with meals.    . fluocinonide ointment (LIDEX) 1.95 % Apply 1 application topically at bedtime. Apply to finger tips    . FLUoxetine (PROZAC) 40 MG capsule Take 40 mg by mouth daily.  1  . fluticasone (FLONASE) 50 MCG/ACT nasal spray Place 1 spray into both nostrils daily.    Marland Kitchen gabapentin (NEURONTIN) 300 MG capsule Take 300 mg by mouth 3 (three) times daily.    Marland Kitchen HYDROcodone-acetaminophen (NORCO/VICODIN) 5-325 MG tablet Take 1 tablet by mouth every 8 (eight) hours as needed for pain.    Marland Kitchen losartan (COZAAR) 25 MG tablet Take 50 mg by mouth daily.    . meclizine (ANTIVERT) 25 MG tablet Take 25 mg by mouth 3 (three) times daily as needed for dizziness.  0  . metoCLOPramide (REGLAN) 10 MG tablet Take 10 mg by mouth at bedtime as needed for nausea.     . montelukast (SINGULAIR) 10 MG tablet Take 10 mg by mouth daily.     Marland Kitchen omeprazole (PRILOSEC) 20 MG capsule Take 20 mg by mouth daily.    . traZODone (DESYREL) 100 MG tablet Take 100 mg by mouth at bedtime.  3  . promethazine (  PHENERGAN) 25 MG tablet Take 25 mg by mouth every 6 (six) hours as needed for nausea or vomiting.      Results for orders placed or performed during the hospital encounter of 05/11/16 (from the past 48 hour(s))  Comprehensive metabolic panel     Status: Abnormal   Collection Time: 05/11/16  1:56 AM  Result Value Ref Range   Sodium 132 (L) 135 - 145 mmol/L   Potassium 3.9 3.5 - 5.1 mmol/L   Chloride 99 (L) 101 - 111 mmol/L   CO2 27 22 - 32 mmol/L   Glucose, Bld 141 (H) 65 - 99 mg/dL   BUN 9 6 - 20 mg/dL   Creatinine, Ser 0.68 0.44 - 1.00 mg/dL   Calcium 8.8 (L) 8.9 - 10.3 mg/dL   Total Protein 7.0 6.5 - 8.1 g/dL   Albumin 3.8 3.5 - 5.0 g/dL   AST  30 15 - 41 U/L   ALT 13 (L) 14 - 54 U/L   Alkaline Phosphatase 62 38 - 126 U/L   Total Bilirubin 0.5 0.3 - 1.2 mg/dL   GFR calc non Af Amer >60 >60 mL/min   GFR calc Af Amer >60 >60 mL/min    Comment: (NOTE) The eGFR has been calculated using the CKD EPI equation. This calculation has not been validated in all clinical situations. eGFR's persistently <60 mL/min signify possible Chronic Kidney Disease.    Anion gap 6 5 - 15  CBC WITH DIFFERENTIAL     Status: Abnormal   Collection Time: 05/11/16  1:56 AM  Result Value Ref Range   WBC 16.3 (H) 3.6 - 11.0 K/uL   RBC 4.19 3.80 - 5.20 MIL/uL   Hemoglobin 13.0 12.0 - 16.0 g/dL   HCT 37.2 35.0 - 47.0 %   MCV 88.8 80.0 - 100.0 fL   MCH 31.0 26.0 - 34.0 pg   MCHC 34.9 32.0 - 36.0 g/dL   RDW 13.9 11.5 - 14.5 %   Platelets 279 150 - 440 K/uL   Neutrophils Relative % 92 %   Neutro Abs 15.0 (H) 1.4 - 6.5 K/uL   Lymphocytes Relative 3 %   Lymphs Abs 0.5 (L) 1.0 - 3.6 K/uL   Monocytes Relative 4 %   Monocytes Absolute 0.7 0.2 - 0.9 K/uL   Eosinophils Relative 1 %   Eosinophils Absolute 0.1 0 - 0.7 K/uL   Basophils Relative 0 %   Basophils Absolute 0.0 0 - 0.1 K/uL  Lactic acid, plasma     Status: Abnormal   Collection Time: 05/11/16  1:56 AM  Result Value Ref Range   Lactic Acid, Venous 2.0 (HH) 0.5 - 1.9 mmol/L    Comment: CRITICAL RESULT CALLED TO, READ BACK BY AND VERIFIED WITH SHANNON MARTIN AT 0232 05/11/16.PMH  Urinalysis complete, with microscopic ([facility] only)     Status: Abnormal   Collection Time: 05/11/16  1:56 AM  Result Value Ref Range   Color, Urine YELLOW (A) YELLOW   APPearance CLEAR (A) CLEAR   Glucose, UA NEGATIVE NEGATIVE mg/dL   Bilirubin Urine NEGATIVE NEGATIVE   Ketones, ur NEGATIVE NEGATIVE mg/dL   Specific Gravity, Urine 1.011 1.005 - 1.030   Hgb urine dipstick NEGATIVE NEGATIVE   pH 7.0 5.0 - 8.0   Protein, ur NEGATIVE NEGATIVE mg/dL   Nitrite NEGATIVE NEGATIVE   Leukocytes, UA NEGATIVE NEGATIVE   RBC / HPF  0-5 0 - 5 RBC/hpf   WBC, UA 0-5 0 - 5 WBC/hpf   Bacteria, UA  NONE SEEN NONE SEEN   Squamous Epithelial / LPF NONE SEEN NONE SEEN  Troponin I     Status: None   Collection Time: 05/11/16  1:56 AM  Result Value Ref Range   Troponin I <0.03 <0.03 ng/mL  Lactic acid, plasma     Status: None   Collection Time: 05/11/16  4:49 AM  Result Value Ref Range   Lactic Acid, Venous 1.1 0.5 - 1.9 mmol/L   Dg Chest Port 1 View  Result Date: 05/11/2016 CLINICAL DATA:  Acute onset of chest congestion and nonproductive cough. Shortness of breath. Initial encounter. EXAM: PORTABLE CHEST 1 VIEW COMPARISON:  Chest radiograph performed 03/30/2016 FINDINGS: There is elevation of the left hemidiaphragm, with left basilar atelectasis. No definite pleural effusion or pneumothorax is seen. The cardiomediastinal silhouette is borderline normal in size. No acute osseous abnormalities are identified. IMPRESSION: Elevation of the left hemidiaphragm, with left basilar atelectasis. Electronically Signed   By: Garald Balding M.D.   On: 05/11/2016 02:23    Review of Systems  Constitutional: Positive for chills. Negative for diaphoresis and fever.  HENT: Negative for sore throat and tinnitus.   Eyes: Negative for blurred vision and redness.  Respiratory: Positive for cough. Negative for shortness of breath.   Cardiovascular: Positive for chest pain. Negative for palpitations, orthopnea and PND.  Gastrointestinal: Negative for abdominal pain, diarrhea, nausea and vomiting.  Genitourinary: Positive for urgency. Negative for dysuria and frequency.  Musculoskeletal: Negative for joint pain and myalgias.  Skin: Negative for rash.       No lesions  Neurological: Negative for speech change, focal weakness and weakness.  Endo/Heme/Allergies: Does not bruise/bleed easily.       No temperature intolerance  Psychiatric/Behavioral: Negative for depression and suicidal ideas.    Blood pressure 137/87, pulse 81, temperature 97.8 F  (36.6 C), temperature source Oral, resp. rate 20, height 5' 2"  (1.575 m), weight 62.8 kg (138 lb 8 oz), SpO2 98 %. Physical Exam  Vitals reviewed. Constitutional: She is oriented to person, place, and time. She appears well-developed and well-nourished. No distress.  HENT:  Head: Normocephalic and atraumatic.  Mouth/Throat: Oropharynx is clear and moist.  Eyes: Conjunctivae and EOM are normal. Pupils are equal, round, and reactive to light. No scleral icterus.  Neck: Normal range of motion. Neck supple. No JVD present. No tracheal deviation present. No thyromegaly present.  Cardiovascular: Normal rate, regular rhythm and normal heart sounds.  Exam reveals no gallop and no friction rub.   No murmur heard. Respiratory: Effort normal and breath sounds normal.  GI: Soft. Bowel sounds are normal. She exhibits no distension. There is no tenderness.  Genitourinary:  Genitourinary Comments: Deferred  Musculoskeletal: Normal range of motion. She exhibits no edema.  Lymphadenopathy:    She has no cervical adenopathy.  Neurological: She is alert and oriented to person, place, and time. No cranial nerve deficit. She exhibits normal muscle tone.  Skin: Skin is warm and dry. No rash noted. No erythema.  Psychiatric: She has a normal mood and affect. Her behavior is normal. Judgment and thought content normal.     Assessment/Plan This is a 70 year old female admitted for sepsis. 1. Sepsis: The patient criteria via leukocytosis is well as fever. Etiology potentially pneumonia. Differential diagnosis includes urinary tract infection although urinalysis is negative. Nonetheless, empiric treatment with cefepime and vancomycin should cover organisms causing both. She is hemodynamically stable. Follow blood cultures for growth and sensitivities. 2. Asthma: May BE continued bleeding to chest pressure.  Initial troponin is negative. Follow-up cardiac biomarkers and monitor telemetry. Continue inhaled  corticosteroid. Albuterol as needed for shortness of breath. 3. Hypertension: Controlled; continue valsartan 4. Depression and anxiety: Continue fluoxetine as well as Klonopin and trazodone 5. DVT prophylaxis: Lovenox 6. GI prophylaxis: Pantoprazole per home regimen The patient is a full code. Time spent on admission orders and patient care approximately 45 minutes  Harrie Foreman, MD 05/11/2016, 6:09 AM

## 2016-05-11 NOTE — ED Triage Notes (Addendum)
Pt reports chest congestion with nonproductive cough; started feeling bad Sunday morning; fever 101.6 in triage-no medication taken for same pta-temp never checked at home, just felt hot;  reports shortness of breath; chest pain increases with coughing and pt says she feels like she can't catch her breath when she has a spell; also reports dysuria for "a couple of days"; pt treated for UTI several weeks ago and was feeling better after antibiotics; cheeks flushed in triage

## 2016-05-11 NOTE — Progress Notes (Signed)
New Admission Note:   Arrival Method: per stretcher from ED, pt came from home Mental Orientation: alert and oriented X4 Telemetry: placed on box 4035, CCMD notified, verified with NT Tened[place] Assessment: Completed Skin: warm, dry, no wounds noted, with few scattered bruises on both arms IV: G20 on the left forearm with transparent dressing, intact Pain: denies any pain at this time. Pt claimed she received pain medicine from ED. Safety Measures: Safety Fall Prevention Plan has been given and discussed. Admission: Completed 1A Orientation: Patient has been oriented to the room, unit and staff.  Family: no family member at bedside at this time  Orders have been reviewed and implemented. Will continue to monitor the patient. Call light has been placed within reach and bed alarm has been activated.   Janice NorrieAnessa Edinson Domeier BSN, RN [facility] 1A

## 2016-05-11 NOTE — ED Provider Notes (Signed)
[facility] Emergency Department Provider Note   ____________________________________________   First MD Initiated Contact with Patient 05/11/16 0136     (approximate)  I have reviewed the triage vital signs and the nursing notes.   HISTORY  Chief Complaint Cough; Dysuria; and Fever   HPI Brooke [name] is a 70 y.o. female with a history of sepsis from UTI in late August of this year was presenting to the emergency department with 1 day of chest pain as well as shortness of breath and cough. She denies any sputum production. She is also saying that she has had several days of burning with urination.Says that she has sharp, midsternal chest pain with coughing.   Past Medical History:  Diagnosis Date  . Asthma     Patient Active Problem List   Diagnosis Date Noted  . UTI (lower urinary tract infection) 03/31/2016    Past Surgical History:  Procedure Laterality Date  . PARTIAL HYSTERECTOMY    . TEMPORAL ARTERY BIOPSY / LIGATION      Prior to Admission medications   Medication Sig Start Date End Date Taking? Authorizing Provider  albuterol (PROVENTIL HFA;VENTOLIN HFA) 108 (90 Base) MCG/ACT inhaler Inhale 2 puffs into the lungs every 6 (six) hours as needed for wheezing or shortness of breath.   Yes Historical Provider, MD  budesonide-formoterol (SYMBICORT) 80-4.5 MCG/ACT inhaler Inhale 2 puffs into the lungs 2 (two) times daily.   Yes Historical Provider, MD  butalbital-acetaminophen-caffeine (FIORICET, ESGIC) 50-325-40 MG tablet Take 1 tablet by mouth 2 (two) times daily as needed for headache.   Yes Historical Provider, MD  clonazePAM (KLONOPIN) 0.5 MG tablet Take 0.5 mg by mouth 2 (two) times daily as needed for anxiety.  03/24/16  Yes Historical Provider, MD  desipramine (NORPRAMIN) 75 MG tablet Take 37.5 mg by mouth at bedtime.  03/29/16  Yes Historical Provider, MD  ferrous sulfate 325 (65 FE) MG tablet Take 325 mg by mouth 3 (three) times daily  with meals.   Yes Historical Provider, MD  fluocinonide ointment (LIDEX) 0.05 % Apply 1 application topically at bedtime. Apply to finger tips   Yes Historical Provider, MD  FLUoxetine (PROZAC) 40 MG capsule Take 40 mg by mouth daily. 01/24/16  Yes Historical Provider, MD  fluticasone (FLONASE) 50 MCG/ACT nasal spray Place 1 spray into both nostrils daily.   Yes Historical Provider, MD  gabapentin (NEURONTIN) 300 MG capsule Take 300 mg by mouth 3 (three) times daily. 02/25/16  Yes Historical Provider, MD  HYDROcodone-acetaminophen (NORCO/VICODIN) 5-325 MG tablet Take 1 tablet by mouth every 8 (eight) hours as needed for pain. 03/30/16  Yes Historical Provider, MD  losartan (COZAAR) 25 MG tablet Take 50 mg by mouth daily. 03/13/16  Yes Historical Provider, MD  meclizine (ANTIVERT) 25 MG tablet Take 25 mg by mouth 3 (three) times daily as needed for dizziness. 02/06/16  Yes Historical Provider, MD  metoCLOPramide (REGLAN) 10 MG tablet Take 10 mg by mouth at bedtime as needed for nausea.  03/25/16  Yes Historical Provider, MD  montelukast (SINGULAIR) 10 MG tablet Take 10 mg by mouth daily.  02/25/16  Yes Historical Provider, MD  omeprazole (PRILOSEC) 20 MG capsule Take 20 mg by mouth daily. 03/09/16  Yes Historical Provider, MD  traZODone (DESYREL) 100 MG tablet Take 100 mg by mouth at bedtime. 01/24/16  Yes Historical Provider, MD  promethazine (PHENERGAN) 25 MG tablet Take 25 mg by mouth every 6 (six) hours as needed for nausea or vomiting.  Historical Provider, MD    Allergies Sulfa antibiotics  Family History  Problem Relation Age of Onset  . CAD Father   . Colon cancer Father     Social History Social History  Substance Use Topics  . Smoking status: Never Smoker  . Smokeless tobacco: Never Used  . Alcohol use Not on file    Review of Systems Constitutional: Positive for fever Eyes: No visual changes. ENT: No sore throat. Cardiovascular: As above Respiratory: As above Gastrointestinal:  No abdominal pain.  No nausea, no vomiting.  No diarrhea.  No constipation. Genitourinary: Negative for dysuria. Musculoskeletal: Negative for back pain. Skin: Negative for rash. Neurological: Negative for headaches, focal weakness or numbness.  10-point ROS otherwise negative.  ____________________________________________   PHYSICAL EXAM:  VITAL SIGNS: ED Triage Vitals  Enc Vitals Group     BP 05/11/16 0126 (!) 148/95     Pulse Rate 05/11/16 0126 96     Resp 05/11/16 0126 18     Temp 05/11/16 0126 (!) 101.6 F (38.7 C)     Temp Source 05/11/16 0126 Oral     SpO2 05/11/16 0126 99 %     Weight 05/11/16 0127 134 lb (60.8 kg)     Height 05/11/16 0127 5\' 2"  (1.575 m)     Head Circumference --      Peak Flow --      Pain Score 05/11/16 0127 9     Pain Loc --      Pain Edu? --      Excl. in GC? --     Constitutional: Alert and oriented. Well appearing and in no acute distress. Eyes: Conjunctivae are normal. PERRL. EOMI. Head: Atraumatic. Nose: No congestion/rhinnorhea. Mouth/Throat: Mucous membranes are moist.   Neck: No stridor.   Cardiovascular: Normal rate, regular rhythm. Grossly normal heart sounds.  Respiratory: Normal respiratory effort.  No retractions. Decreased breath sounds to the left hemithorax. Gastrointestinal: Soft and nontender. No distention. No CVA tenderness. Musculoskeletal: No lower extremity tenderness nor edema.  No joint effusions. Neurologic:  Normal speech and language. No gross focal neurologic deficits are appreciated.  Skin:  Skin is warm, dry and intact. No rash noted. Psychiatric: Mood and affect are normal. Speech and behavior are normal.  ____________________________________________   LABS (all labs ordered are listed, but only abnormal results are displayed)  Labs Reviewed  COMPREHENSIVE METABOLIC PANEL - Abnormal; Notable for the following:       Result Value   Sodium 132 (*)    Chloride 99 (*)    Glucose, Bld 141 (*)    Calcium  8.8 (*)    ALT 13 (*)    All other components within normal limits  CBC WITH DIFFERENTIAL/PLATELET - Abnormal; Notable for the following:    WBC 16.3 (*)    Neutro Abs 15.0 (*)    Lymphs Abs 0.5 (*)    All other components within normal limits  LACTIC ACID, PLASMA - Abnormal; Notable for the following:    Lactic Acid, Venous 2.0 (*)    All other components within normal limits  CULTURE, BLOOD (ROUTINE X 2)  CULTURE, BLOOD (ROUTINE X 2)  URINE CULTURE  TROPONIN I  LACTIC ACID, PLASMA  URINALYSIS COMPLETEWITH MICROSCOPIC ([facility] ONLY)   ____________________________________________  EKG  ED ECG REPORT I, Arelia LongestSchaevitz,  Tomica Arseneault M, the attending physician, personally viewed and interpreted this ECG.   Date: 05/11/2016  EKG Time: 0144  Rate: 97  Rhythm: normal sinus rhythm  Axis: normal  Intervals:none  ST&T Change: No ST segment elevation or depression. T-wave inversion, singular, and aVL.  ____________________________________________  RADIOLOGY  DG Chest [place] 1 View (Accession [id number]) (Order [id number])  Imaging  Date: 05/11/2016 Department: [facility] EMERGENCY DEPARTMENT Released By/Authorizing: Myrna Blazer, MD (auto-released)  Exam Information   Status Exam Begun  Exam Ended   Final [99] 05/11/2016 1:45 AM 05/11/2016 1:52 AM  PACS Images   Show images for DG Chest Port 1 View  Study Result   CLINICAL DATA:  Acute onset of chest congestion and nonproductive cough. Shortness of breath. Initial encounter.  EXAM: PORTABLE CHEST 1 VIEW  COMPARISON:  Chest radiograph performed 03/30/2016  FINDINGS: There is elevation of the left hemidiaphragm, with left basilar atelectasis. No definite pleural effusion or pneumothorax is seen.  The cardiomediastinal silhouette is borderline normal in size. No acute osseous abnormalities are identified.  IMPRESSION: Elevation of the left hemidiaphragm, with left basilar  atelectasis.   Electronically Signed   By: Roanna Raider M.D.   On: 05/11/2016 02:23     ____________________________________________   PROCEDURES  Procedure(s) performed:   Procedures  Critical Care performed:   ____________________________________________   INITIAL IMPRESSION / ASSESSMENT AND PLAN / ED COURSE  Pertinent labs & imaging results that were available during my care of the patient were reviewed by me and considered in my medical decision making (see chart for details).  ----------------------------------------- 2:47 AM on 05/11/2016 -----------------------------------------  Patient with lactic acid of 2 and white count of 16. Possible atelectasis versus pneumonia on chest x-ray. Given the patient's clinical history I will treat this as pneumonia and give the patient hospital-acquired antibiotics because she was recently admitted in late August of this year. Signed out to Dr. Sheryle Hail. Explained to The patient as well as her husband that she will be admitted to the hospital.  Clinical Course     ____________________________________________   FINAL CLINICAL IMPRESSION(S) / ED DIAGNOSES  Sepsis. Hospital-acquired pneumonia.    NEW MEDICATIONS STARTED DURING THIS VISIT:  New Prescriptions   No medications on file     Note:  This document was prepared using Dragon voice recognition software and may include unintentional dictation errors.    Myrna Blazer, MD 05/11/16 [phone]

## 2016-05-11 NOTE — Progress Notes (Signed)
Pharmacy Antibiotic Note  Brooke [name] is a 70 y.o. female admitted on 05/11/2016 with pneumonia.  Pharmacy has been consulted for vancomycin and cefepime dosing.  Plan: DW 60.8kg  Vd 43L kei 0.051  T1/2 14 hours Vancomycin 1 gram q 18 hours ordered with stacked dosing. Level before 5th dose. Goal trough 15-20.  Cefepime 2 grams q 12 hours ordered.  Height: 5\' 2"  (157.5 cm) Weight: 134 lb (60.8 kg) IBW/kg (Calculated) : 50.1  Temp (24hrs), Avg:101.6 F (38.7 C), Min:101.6 F (38.7 C), Max:101.6 F (38.7 C)   Recent Labs Lab 05/11/16 0156  WBC 16.3*  CREATININE 0.68  LATICACIDVEN 2.0*    Estimated Creatinine Clearance: 56.2 mL/min (by C-G formula based on SCr of 0.68 mg/dL).    Allergies  Allergen Reactions  . Sulfa Antibiotics Rash    Antimicrobials this admission: vancomycin  >>  cefepime  >>   Dose adjustments this admission:   Microbiology results: 10/9 BCx: pending 10/9 UCx: pending     10/9 CXR: L atelectasis 10/9 UA: (-)  Thank you for allowing pharmacy to be a part of this patient's care.  Rayburn Mundis S 05/11/2016 3:33 AM

## 2016-05-11 NOTE — Progress Notes (Signed)
Pt day fairly uneventful. Had some brief periods of nausea and HA pain but resolved quickly with medication. No complaints of chest pain.

## 2016-05-11 NOTE — Progress Notes (Signed)
Chaplain on rounds visited patient who was with her husband in the room. Patient told chaplain she would love to have prayers but her pastor had just visited her, and that she was afraid her disease would infect the chaplain. She and her husband asked chaplain to visit them another time.    05/11/16 1400  Clinical Encounter Type  Visited With Patient  Visit Type Initial  Referral From Nurse  Spiritual Encounters  Spiritual Needs Prayer

## 2016-05-12 ENCOUNTER — Observation Stay: Payer: Medicare Other

## 2016-05-12 LAB — CBC
HCT: 35.9 % (ref 35.0–47.0)
Hemoglobin: 12.2 g/dL (ref 12.0–16.0)
MCH: 30.6 pg (ref 26.0–34.0)
MCHC: 34 g/dL (ref 32.0–36.0)
MCV: 90 fL (ref 80.0–100.0)
PLATELETS: 249 10*3/uL (ref 150–440)
RBC: 4 MIL/uL (ref 3.80–5.20)
RDW: 14.4 % (ref 11.5–14.5)
WBC: 8.4 10*3/uL (ref 3.6–11.0)

## 2016-05-12 LAB — BASIC METABOLIC PANEL
Anion gap: 5 (ref 5–15)
BUN: 6 mg/dL (ref 6–20)
CO2: 29 mmol/L (ref 22–32)
CREATININE: 0.53 mg/dL (ref 0.44–1.00)
Calcium: 8.4 mg/dL — ABNORMAL LOW (ref 8.9–10.3)
Chloride: 104 mmol/L (ref 101–111)
GFR calc Af Amer: >60 mL/min (ref >60)
GLUCOSE: 93 mg/dL (ref 65–99)
Potassium: 4.1 mmol/L (ref 3.5–5.1)
SODIUM: 138 mmol/L (ref 135–145)

## 2016-05-12 LAB — URINE CULTURE: Culture: <10000 — AB

## 2016-05-12 NOTE — Plan of Care (Signed)
Problem: Bowel/Gastric: Goal: Will not experience complications related to bowel motility Outcome: Completed/Met Date Met: 05/12/16 Pt is ready for discharge.

## 2016-05-12 NOTE — Discharge Instructions (Signed)
Asthma, Adult Asthma is a condition of the lungs in which the airways tighten and narrow. Asthma can make it hard to breathe. Asthma cannot be cured, but medicine and lifestyle changes can help control it. Asthma may be started (triggered) by:  Animal skin flakes (dander).  Dust.  Cockroaches.  Pollen.  Mold.  Smoke.  Cleaning products.  Hair sprays or aerosol sprays.  Paint fumes or strong smells.  Cold air, weather changes, and winds.  Crying or laughing hard.  Stress.  Certain medicines or drugs.  Foods, such as dried fruit, potato chips, and sparkling grape juice.  Infections or conditions (colds, flu).  Exercise.  Certain medical conditions or diseases.  Exercise or tiring activities. HOME CARE   Take medicine as told by your doctor.  Use a peak flow meter as told by your doctor. A peak flow meter is a tool that measures how well the lungs are working.  Record and keep track of the peak flow meter's readings.  Understand and use the asthma action plan. An asthma action plan is a written plan for taking care of your asthma and treating your attacks.  To help prevent asthma attacks:  Do not smoke. Stay away from secondhand smoke.  Change your heating and air conditioning filter often.  Limit your use of fireplaces and wood stoves.  Get rid of pests (such as roaches and mice) and their droppings.  Throw away plants if you see mold on them.  Clean your floors. Dust regularly. Use cleaning products that do not smell.  Have someone vacuum when you are not home. Use a vacuum cleaner with a HEPA filter if possible.  Replace carpet with wood, tile, or vinyl flooring. Carpet can trap animal skin flakes and dust.  Use allergy-proof pillows, mattress covers, and box spring covers.  Wash bed sheets and blankets every week in hot water and dry them in a dryer.  Use blankets that are made of polyester or cotton.  Clean bathrooms and kitchens with bleach.  If possible, have someone repaint the walls in these rooms with mold-resistant paint. Keep out of the rooms that are being cleaned and painted.  Wash hands often. GET HELP IF:  You have make a whistling sound when breaking (wheeze), have shortness of breath, or have a cough even if taking medicine to prevent attacks.  The colored mucus you cough up (sputum) is thicker than usual.  The colored mucus you cough up changes from clear or white to yellow, green, gray, or bloody.  You have problems from the medicine you are taking such as:  A rash.  Itching.  Swelling.  Trouble breathing.  You need reliever medicines more than 2-3 times a week.  Your peak flow measurement is still at 50-79% of your personal best after following the action plan for 1 hour.  You have a fever. GET HELP RIGHT AWAY IF:   You seem to be worse and are not responding to medicine during an asthma attack.  You are short of breath even at rest.  You get short of breath when doing very little activity.  You have trouble eating, drinking, or talking.  You have chest pain.  You have a fast heartbeat.  Your lips or fingernails start to turn blue.  You are light-headed, dizzy, or faint.  Your peak flow is less than 50% of your personal best.   This information is not intended to replace advice given to you by your health care provider. Make sure   you discuss any questions you have with your health care provider.   Document Released: 01/06/2008 Document Revised: 04/10/2015 Document Reviewed: 02/16/2013 Elsevier Interactive Patient Education 2016 Elsevier Inc.  

## 2016-05-12 NOTE — Progress Notes (Signed)
[employer] [facility] [place] [facility]        Brooke MinusBrenda [name] was admitted to the Hospital on 05/11/2016 and Discharged  05/12/2016 and should be excused from work  for 6 days starting 05/11/2016 , may return to work without any restrictions on 05/18/2016  [facility], Naren Benally M.D on 05/12/2016,at 12:17 PM  [employer] [facility] [place] [facility]    Office  [phone]

## 2016-05-12 NOTE — Progress Notes (Signed)
This Clinical research associatewriter present when Dr. Sherryll BurgerShah rounded on pt, he carefully explained information regarding viral infections prior to discharge. This writer removed piv from l arm with catheter intact, pt tolerated well. Pt received d/c instructions and work note, verbalized understanding of directions with husband present, and is escorted to front entrance via wc at this time.

## 2016-05-12 NOTE — Progress Notes (Signed)
Shift assessment completed at 0715. Pt is awake, alert and oriented, in no distress, denied pain, stated Brooke [name] has been slightly nauseous since admission, refused iron tablet stating Brooke [name] feels this may be causing the nausea. Pt is on room air, lungs are clear bilat, hr is regular, abdomen is soft, bs heard. Pt is oob to bathroom independently but is urged to call for assisst if Brooke [name] feels unsteady/dizzy, etc. Ppp, no edema noted. PIV #20 intact to L fa, site is free of redness and swelling. Call bell in reach.

## 2016-05-13 NOTE — Discharge Summary (Signed)
[place] [facility] [place] NAME: Brooke [name]    MR#:  [id number]  DATE OF BIRTH:  [birth year]  DATE OF ADMISSION:  05/11/2016   ADMITTING PHYSICIAN: Harrie Foreman, MD  DATE OF DISCHARGE: 05/12/2016  1:10 PM  PRIMARY CARE PHYSICIAN: Harlow Ohms, MD   ADMISSION DIAGNOSIS:  HCAP (healthcare-associated pneumonia) [J18.9] Sepsis, due to unspecified organism ([place]) [A41.9] DISCHARGE DIAGNOSIS:  Active Problems:   Sepsis ([place])   Fever of unknown origin  SECONDARY DIAGNOSIS:   Past Medical History:  Diagnosis Date  . Asthma    HOSPITAL COURSE:  This is a 70 year old female admitted for sepsis.  1. Sepsis: present on admission. met criteria via leukocytosis as well as fever.  No Bacterial Pneumonia as procalcitonin normal  2. Asthma: can be causing chest pressure.  Negative serial cardiac biomarkers. Continue inhaled corticosteroid. Albuterol as needed for shortness of breath.  3. Hypertension: well Controlled; continue valsartan  4. Depression and anxiety: Continue fluoxetine as well as Klonopin and trazodone  5.  Hyponatremia - resolved with hydration, could have been due to dehydration DISCHARGE CONDITIONS:  stable CONSULTS OBTAINED:   DRUG ALLERGIES:   Allergies  Allergen Reactions  . Sulfa Antibiotics Rash   DISCHARGE MEDICATIONS:     Medication List    TAKE these medications   albuterol 108 (90 Base) MCG/ACT inhaler Commonly known as:  PROVENTIL HFA;VENTOLIN HFA Inhale 2 puffs into the lungs every 6 (six) hours as needed for wheezing or shortness of breath.   budesonide-formoterol 80-4.5 MCG/ACT inhaler Commonly known as:  SYMBICORT Inhale 2 puffs into the lungs 2 (two) times daily.   butalbital-acetaminophen-caffeine 50-325-40 MG tablet Commonly known as:  FIORICET, ESGIC Take 1 tablet by mouth 2 (two) times daily as needed for headache.   clonazePAM 0.5 MG tablet Commonly known as:  KLONOPIN Take  0.5 mg by mouth 2 (two) times daily as needed for anxiety.   desipramine 75 MG tablet Commonly known as:  NORPRAMIN Take 37.5 mg by mouth at bedtime.   ferrous sulfate 325 (65 FE) MG tablet Take 325 mg by mouth 3 (three) times daily with meals.   fluocinonide ointment 0.05 % Commonly known as:  LIDEX Apply 1 application topically at bedtime. Apply to finger tips   FLUoxetine 40 MG capsule Commonly known as:  PROZAC Take 40 mg by mouth daily.   fluticasone 50 MCG/ACT nasal spray Commonly known as:  FLONASE Place 1 spray into both nostrils daily.   gabapentin 300 MG capsule Commonly known as:  NEURONTIN Take 300 mg by mouth 3 (three) times daily.   HYDROcodone-acetaminophen 5-325 MG tablet Commonly known as:  NORCO/VICODIN Take 1 tablet by mouth every 8 (eight) hours as needed for pain.   losartan 25 MG tablet Commonly known as:  COZAAR Take 50 mg by mouth daily.   meclizine 25 MG tablet Commonly known as:  ANTIVERT Take 25 mg by mouth 3 (three) times daily as needed for dizziness.   metoCLOPramide 10 MG tablet Commonly known as:  REGLAN Take 10 mg by mouth at bedtime as needed for nausea.   montelukast 10 MG tablet Commonly known as:  SINGULAIR Take 10 mg by mouth daily.   omeprazole 20 MG capsule Commonly known as:  PRILOSEC Take 20 mg by mouth daily.   promethazine 25 MG tablet Commonly known as:  PHENERGAN Take 25 mg by mouth every 6 (six) hours as needed for nausea or vomiting.   traZODone 100  MG tablet Commonly known as:  DESYREL Take 100 mg by mouth at bedtime.      DISCHARGE INSTRUCTIONS:   DIET:  Regular diet DISCHARGE CONDITION:  Good ACTIVITY:  Activity as tolerated OXYGEN:  Home Oxygen: No.  Oxygen Delivery: room air DISCHARGE LOCATION:  home   If you experience worsening of your admission symptoms, develop shortness of breath, life threatening emergency, suicidal or homicidal thoughts you must seek medical attention immediately by  calling 911 or calling your MD immediately  if symptoms less severe.  You Must read complete instructions/literature along with all the possible adverse reactions/side effects for all the Medicines you take and that have been prescribed to you. Take any new Medicines after you have completely understood and accpet all the possible adverse reactions/side effects.   Please note  You were cared for by a hospitalist during your hospital stay. If you have any questions about your discharge medications or the care you received while you were in the hospital after you are discharged, you can call the unit and asked to speak with the hospitalist on call if the hospitalist that took care of you is not available. Once you are discharged, your primary care physician will handle any further medical issues. Please note that NO REFILLS for any discharge medications will be authorized once you are discharged, as it is imperative that you return to your primary care physician (or establish a relationship with a primary care physician if you do not have one) for your aftercare needs so that they can reassess your need for medications and monitor your lab values.    On the day of Discharge:  VITAL SIGNS:  Blood pressure (!) 156/82, pulse 78, temperature 97.7 F (36.5 C), temperature source Oral, resp. rate 18, height _0  (1.575 m), weight 63.2 kg (139 lb 5.3 oz), SpO2 95 %. PHYSICAL EXAMINATION:  GENERAL:  70 y.o.-year-old patient lying in the bed with no acute distress.  EYES: Pupils equal, round, reactive to light and accommodation. No scleral icterus. Extraocular muscles intact.  HEENT: Head atraumatic, normocephalic. Oropharynx and nasopharynx clear.  NECK:  Supple, no jugular venous distention. No thyroid enlargement, no tenderness.  LUNGS: Normal breath sounds bilaterally, no wheezing, rales,rhonchi or crepitation. No use of accessory muscles of respiration.  CARDIOVASCULAR: S1, S2 normal. No murmurs,  rubs, or gallops.  ABDOMEN: Soft, non-tender, non-distended. Bowel sounds present. No organomegaly or mass.  EXTREMITIES: No pedal edema, cyanosis, or clubbing.  NEUROLOGIC: Cranial nerves II through XII are intact. Muscle strength 5/5 in all extremities. Sensation intact. Gait not checked.  PSYCHIATRIC: The patient is alert and oriented x 3.  SKIN: No obvious rash, lesion, or ulcer.  DATA REVIEW:   CBC  Recent Labs Lab 05/12/16 0335  WBC 8.4  HGB 12.2  HCT 35.9  PLT 249    Chemistries   Recent Labs Lab 05/11/16 0156 05/12/16 0335  NA 132* 138  K 3.9 4.1  CL 99* 104  CO2 27 29  GLUCOSE 141* 93  BUN 9 6  CREATININE 0.68 0.53  CALCIUM 8.8* 8.4*  AST 30  --   ALT 13*  --   ALKPHOS 62  --   BILITOT 0.5  --     Follow-up Information    Harlow Ohms, MD. Schedule an appointment as soon as possible for a visit in 1 week(s).   Specialty:  Geriatric Medicine Contact information: [address] [place] [address] [phone]  Management plans discussed with the patient, family and they are in agreement.  CODE STATUS: FULL CODE  TOTAL TIME TAKING CARE OF THIS PATIENT: 45 minutes.    [facility], Angelie Kram M.D on 05/13/2016 at 2:04 PM  Between 7am to 6pm - Pager - [phone]  After 6pm go to [email or web address] - Proofreader  [facility]  Office  [phone]  CC: Primary care physician; Harlow Ohms, MD   Note: This dictation was prepared with Dragon dictation along with smaller phrase technology. Any transcriptional errors that result from this process are unintentional.

## 2016-05-16 LAB — CULTURE, BLOOD (ROUTINE X 2)
CULTURE: NO GROWTH
CULTURE: NO GROWTH

## 2017-02-15 DIAGNOSIS — H9041 Sensorineural hearing loss, unilateral, right ear, with unrestricted hearing on the contralateral side: Secondary | ICD-10-CM | POA: Insufficient documentation

## 2017-03-09 IMAGING — CT CT HEAD W/O CM
3 series · 15 of 46 positions shown, 18 images · non-contrast
Comparison: None.

CLINICAL DATA: Blurred vision, prior RIGHT side facial nerve
surgery December 2015, prior temporal artery biopsy and ligation

EXAM:
CT HEAD WITHOUT CONTRAST
TECHNIQUE: Contiguous axial images were obtained from the base of the skull
through the vertex without intravenous contrast.

[Series 2: head wo · axial · 0.40mm/px · z∈[+327,+447]mm · 9 of 29 slices shown, 12 images]
[im 3/29  brain]
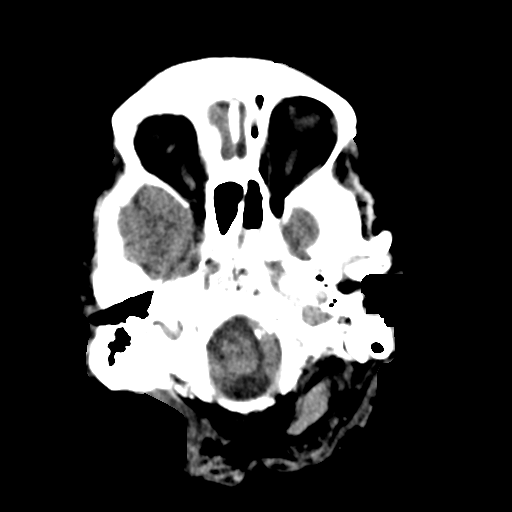
[im 3/29  bone]
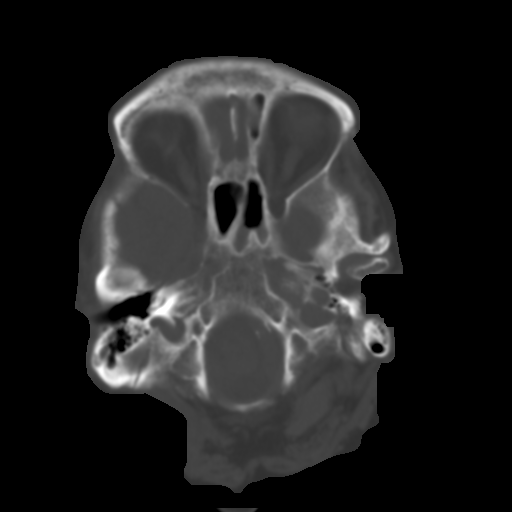
[im 6/29  brain]
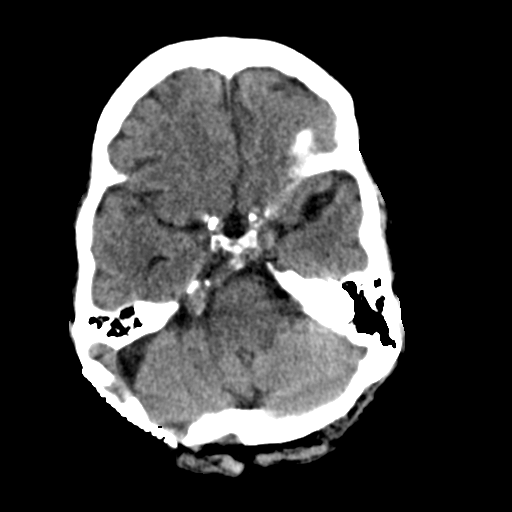
[im 9/29  brain]
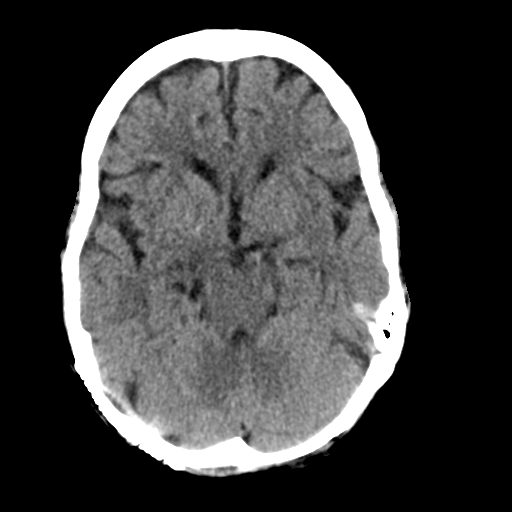
[im 12/29  brain]
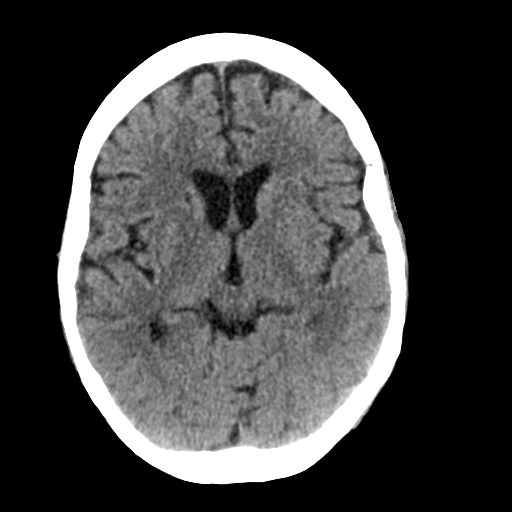
[im 15/29  brain]
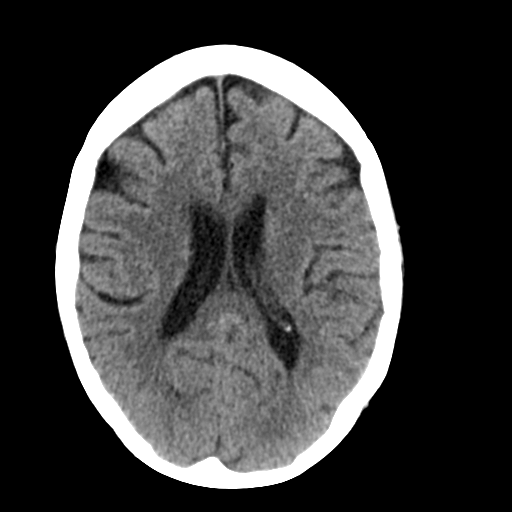
[im 15/29  bone]
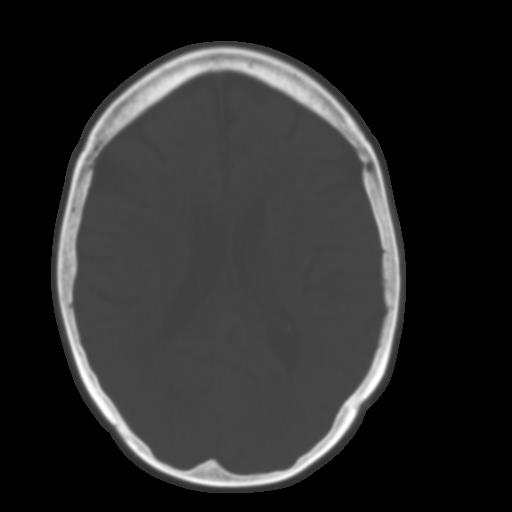
[im 18/29  brain]
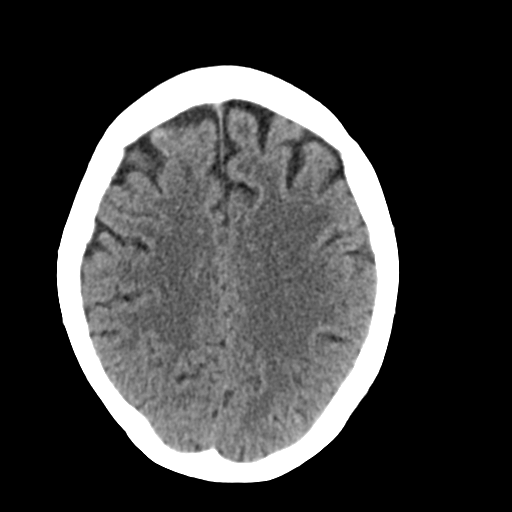
[im 21/29  brain]
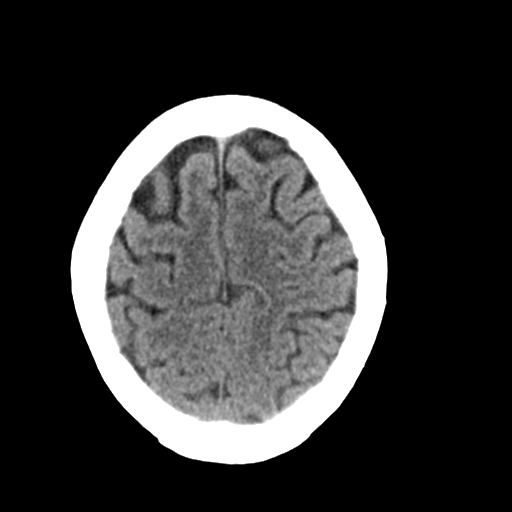
[im 24/29  brain]
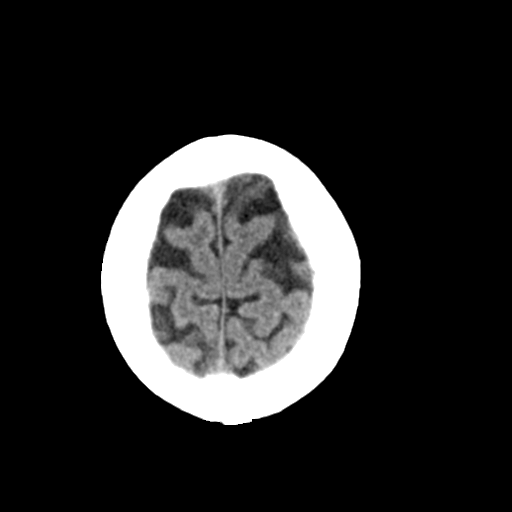
[im 27/29  brain]
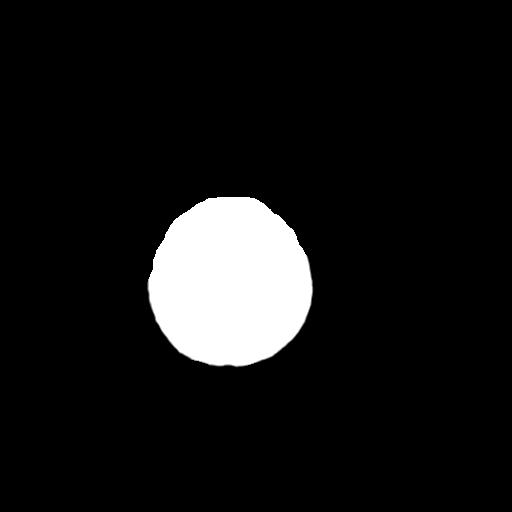
[im 27/29  bone]
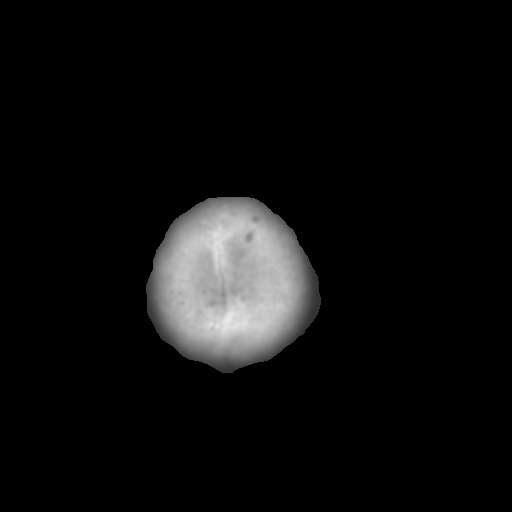

[Series 4: coronal soft tissue · coronal · 0.31mm/px · 3 of 66 slices shown]
[im 22/66  brain]
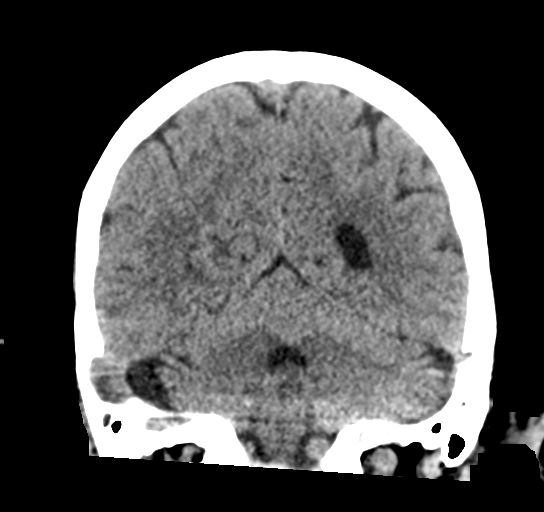
[im 29/66  brain]
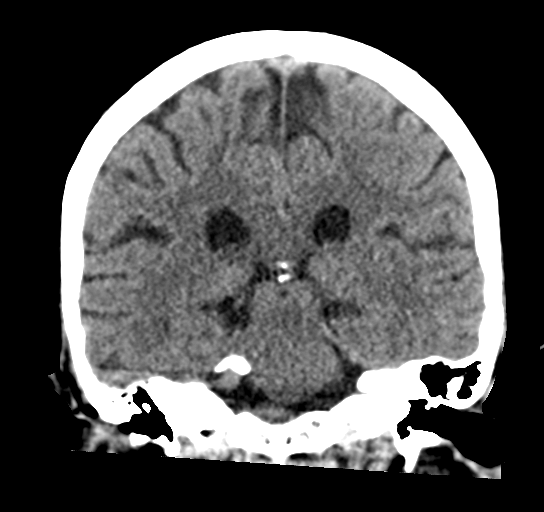
[im 37/66  brain]
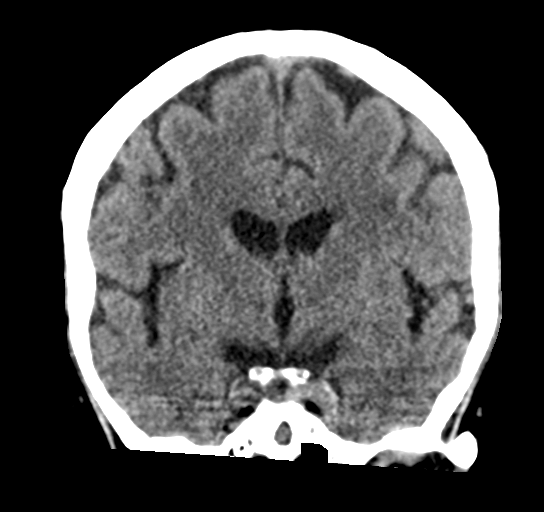

[Series 5: sagittal soft tissue · sagittal · 0.27mm/px · 3 of 53 slices shown]
[im 18/53  brain]
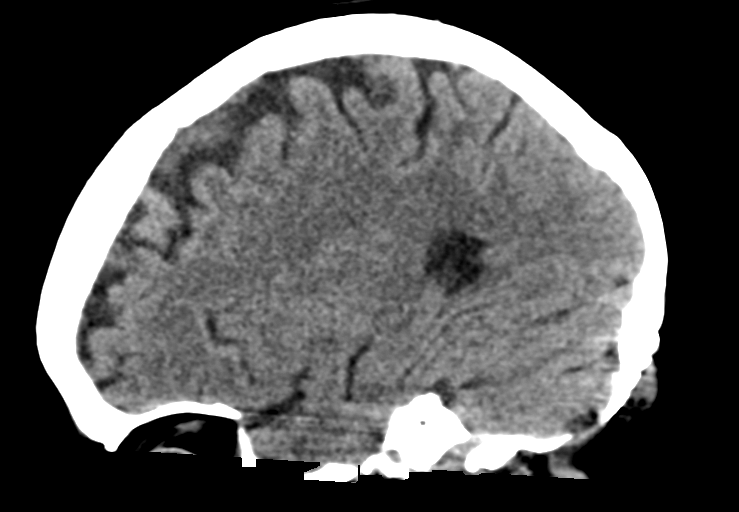
[im 27/53  brain]
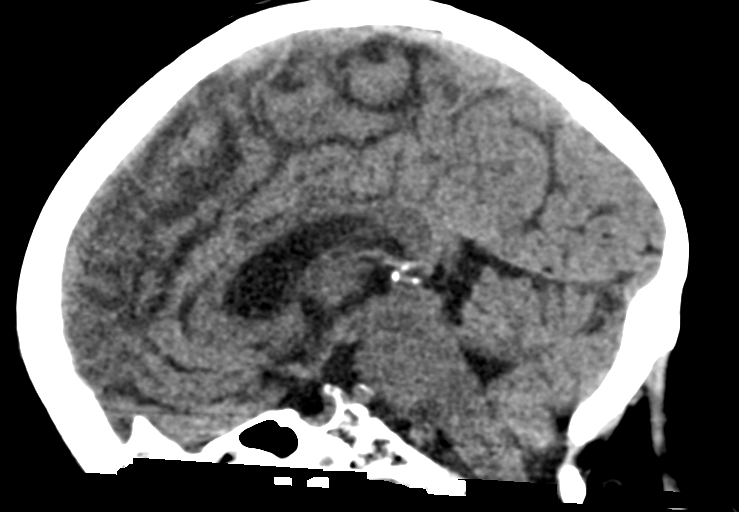
[im 35/53  brain]
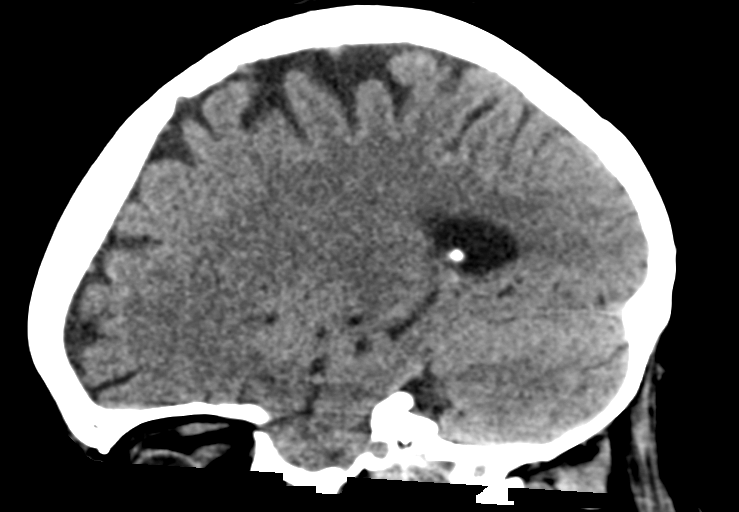

[15 of 46 positions shown; findings below may reference images not displayed]

FINDINGS: Prior RIGHT occipital craniotomy.

Radiopacity identified at the RIGHT lateral aspect of the pons,
likely an implanted surgical object.

Mild generalized atrophy.

Normal ventricular morphology.

No midline shift or mass effect.

Small old lacunar infarct at RIGHT caudate head.

No intracranial hemorrhage, mass lesion, or evidence acute
infarction.

No extra-axial fluid collection.

Osseous structures unremarkable.
IMPRESSION: Post RIGHT occipital craniotomy with likely implanted surgical
object at the RIGHT lateral aspect of the pons.

Small old appearing lacunar infarct at RIGHT caudate head.

No other definite intracranial abnormalities identified.

## 2017-04-19 IMAGING — DX DG CHEST 1V PORT
1 series · 1 of 1 positions shown · non-contrast
Comparison: Chest radiograph performed 03/30/2016

CLINICAL DATA: Acute onset of chest congestion and nonproductive
cough. Shortness of breath. Initial encounter.

EXAM:
PORTABLE CHEST 1 VIEW

[chest ap]
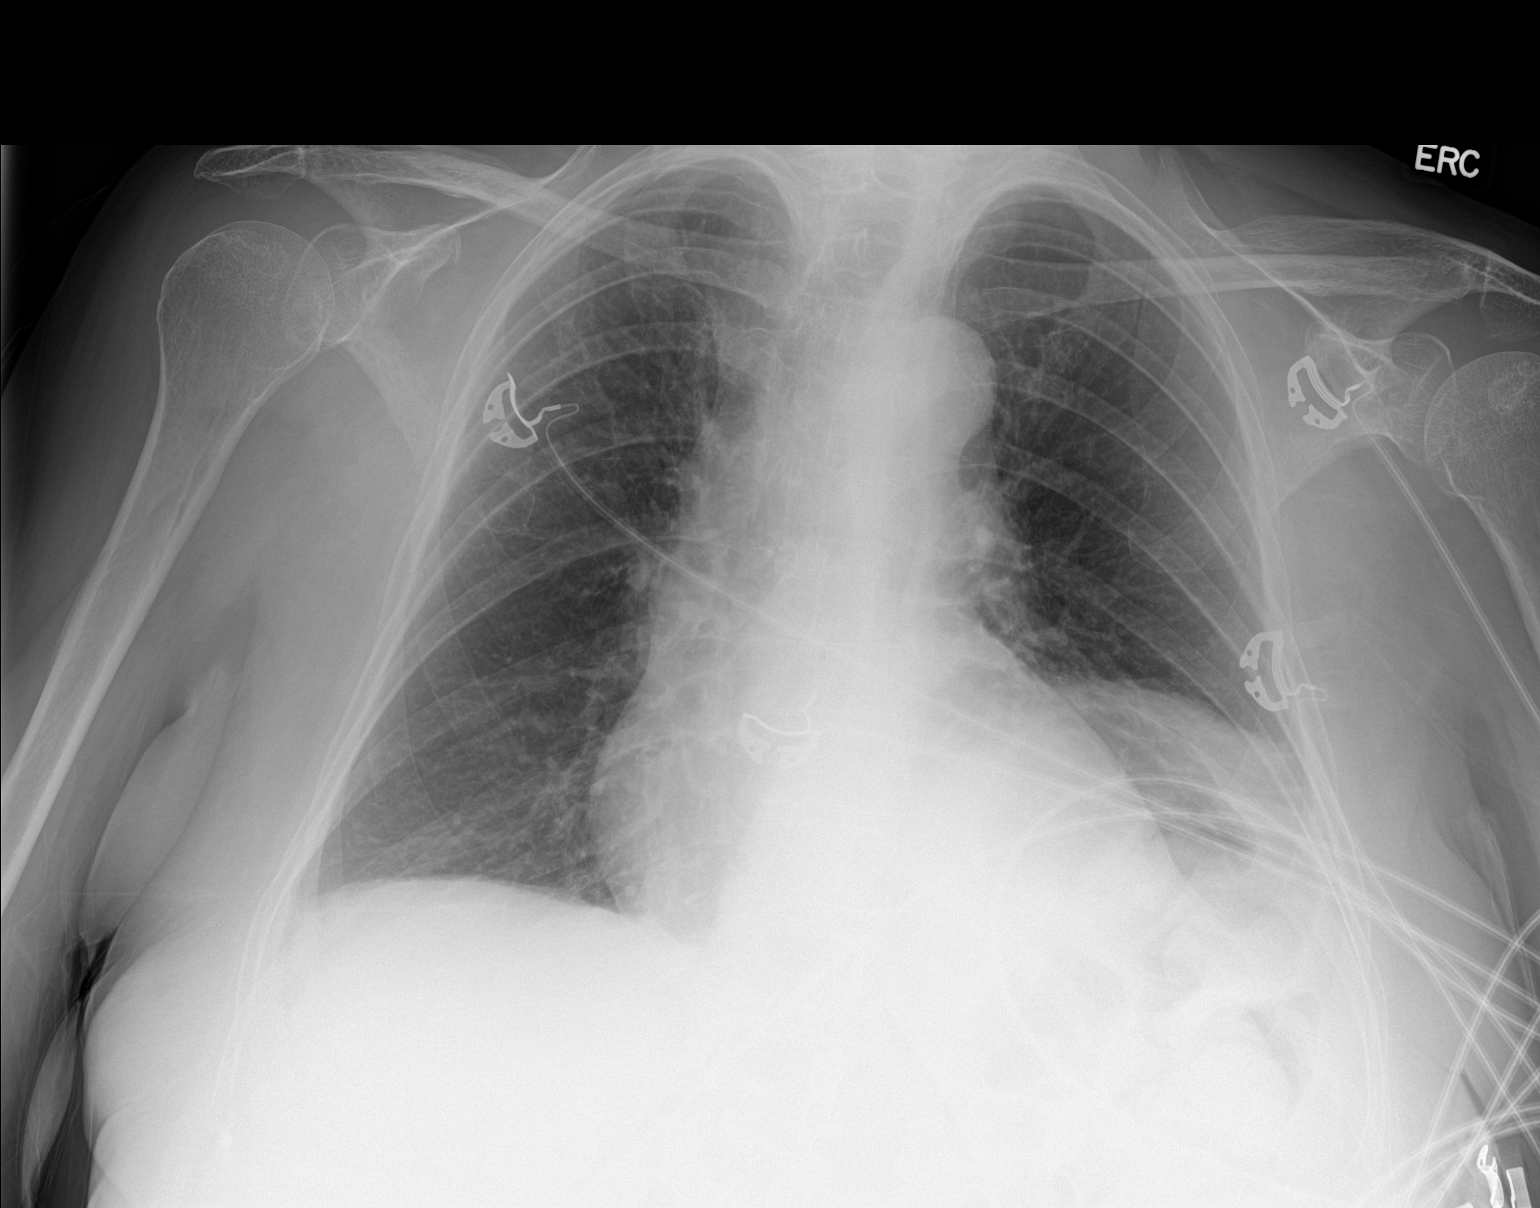

[1 of 1 positions shown; findings below may reference images not displayed]

FINDINGS: There is elevation of the left hemidiaphragm, with left basilar
atelectasis. No definite pleural effusion or pneumothorax is seen.

The cardiomediastinal silhouette is borderline normal in size. No
acute osseous abnormalities are identified.
IMPRESSION: Elevation of the left hemidiaphragm, with left basilar atelectasis.

## 2017-04-20 IMAGING — CR DG CHEST 2V
1 series · 2 of 2 positions shown · non-contrast
Comparison: 05/11/2016, 03/30/2016, 03/27/2006, 11/15/2005 chest
radiographs. Chest CT 07/02/2009

CLINICAL DATA: 70-year-old patient with fever and chills and
tightness in the right chest.

EXAM:
CHEST  2 VIEW

[Series 1: dg chest 2 view · 0.14mm/px · 2 of 2 slices shown]
[im 1/2]
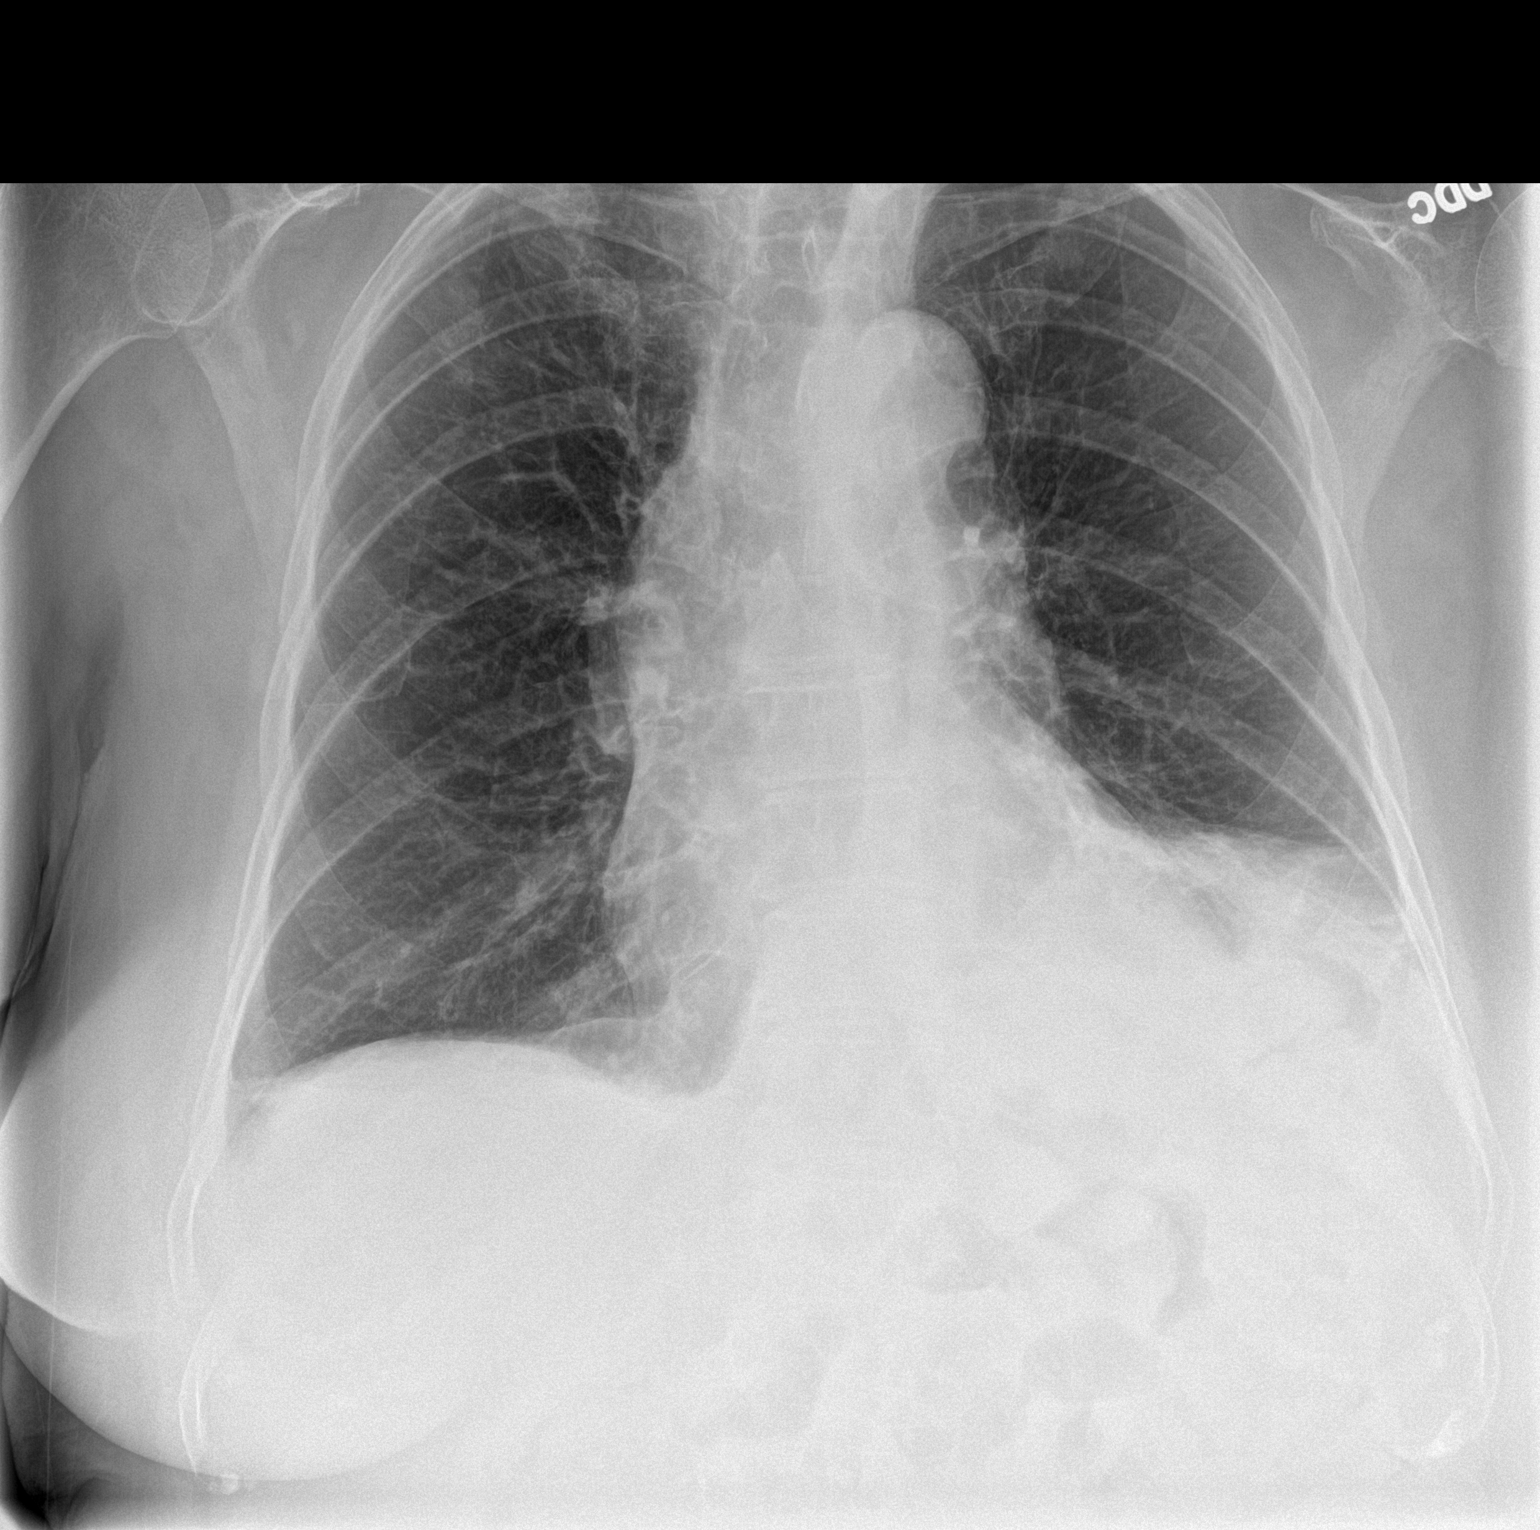
[im 2/2]
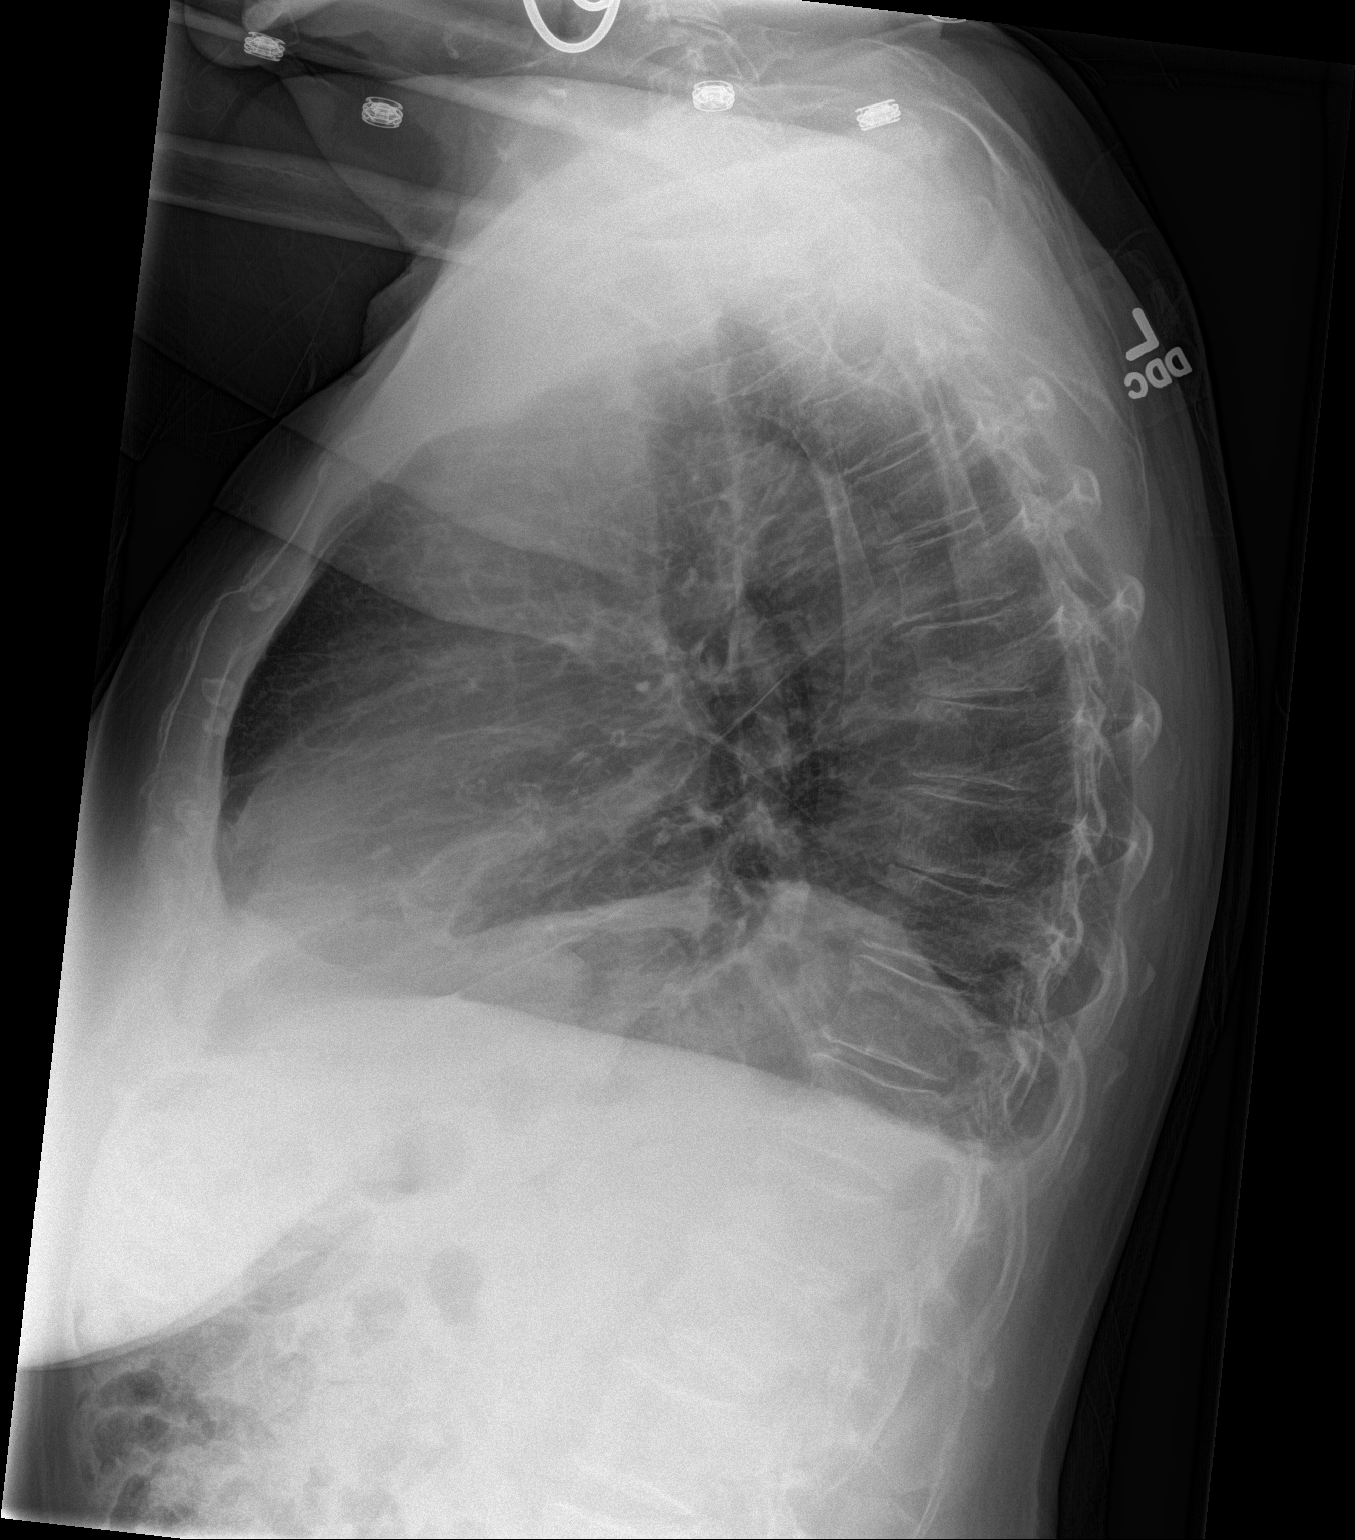

[2 of 2 positions shown; findings below may reference images not displayed]

FINDINGS: The heart size and mediastinal contours are within normal limits.
The patient has chronic mild to moderate elevation of the left
hemidiaphragm. Patchy linearly-oriented opacities are noted in the
lingula. No definite pleural effusion. Negative for pneumothorax.
The right lung is well expanded and clear.

No acute osseous abnormality.
IMPRESSION: Opacities in the lingula. Considerations include atelectasis,
possibly related to the patient's chronically elevated left
hemidiaphragm ; or developing pneumonia, especially given the
patient's symptoms. Consider follow-up to clearing.

The right lung is clear.

## 2020-04-25 ENCOUNTER — Encounter: Payer: Self-pay | Admitting: Student

## 2020-05-22 DIAGNOSIS — N3946 Mixed incontinence: Secondary | ICD-10-CM | POA: Insufficient documentation

## 2020-10-08 ENCOUNTER — Encounter: Payer: Self-pay | Admitting: *Deleted

## 2020-10-31 ENCOUNTER — Ambulatory Visit: Payer: Medicare Other | Admitting: Gastroenterology

## 2020-11-12 DIAGNOSIS — K582 Mixed irritable bowel syndrome: Secondary | ICD-10-CM | POA: Insufficient documentation

## 2020-12-09 ENCOUNTER — Encounter: Payer: Self-pay | Admitting: Gastroenterology

## 2020-12-09 ENCOUNTER — Other Ambulatory Visit: Payer: Self-pay

## 2020-12-09 ENCOUNTER — Ambulatory Visit (INDEPENDENT_AMBULATORY_CARE_PROVIDER_SITE_OTHER): Payer: Medicare Other | Admitting: Gastroenterology

## 2020-12-09 VITALS — BP 123/66 | HR 62 | Temp 98.4°F | Ht 62.0 in | Wt 118.1 lb

## 2020-12-09 DIAGNOSIS — R197 Diarrhea, unspecified: Secondary | ICD-10-CM | POA: Diagnosis not present

## 2020-12-09 NOTE — Progress Notes (Signed)
Wyline Mood MD, MRCP(U.K) [address]  [address]  [place], [place] [address]  Main: [phone]  Fax: [phone]   Gastroenterology Consultation  Referring Provider:     Earnestine Leys, MD Primary Care Physician:  Earnestine Leys, MD Primary Gastroenterologist:  Dr. Wyline Mood  Reason for Consultation:     Fecal smearing        HPI:   Brooke [name] is a 75 y.o. y/o female referred for consultation & management  by Dr. Mellody Dance, Raphael Gibney, MD.  She has been referred for fecal smearing.    She previously underwent a colonoscopy at [facility] by Dr.Gupta on 03/09/2020.  12 mm polyp in the ascending colon was received.  Piecemeal cold snare and a 5 mm polyp was resected and sigmoid colon with a cold snare.  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  11/12/2020 hemoglobin 12.4 g, TSH normal, CMP normal.   She states that she is here to see me for diarrhea  Diarrhea :  Onset: 1 year back Number of bowel movements a day : Multiple cannot quantify Color : None nonbloody Consistency: Watery Present status: Ongoing Shape of stool: None Weight loss: 5 to 7 pounds over 1 year Prior colonoscopy: As above Artificial sugars/sodas/chewing gum: None Bloating: None Gas: None Antibiotic use: None  Still has a gallbladder intact  Past Medical History:  Diagnosis Date  . Asthma     Past Surgical History:  Procedure Laterality Date  . PARTIAL HYSTERECTOMY    . TEMPORAL ARTERY BIOPSY / LIGATION      Prior to Admission medications   Medication Sig Start Date End Date Taking? Authorizing Provider  albuterol (PROVENTIL HFA;VENTOLIN HFA) 108 (90 Base) MCG/ACT inhaler Inhale 2 puffs into the lungs every 6 (six) hours as needed for wheezing or shortness of breath.    [provider]  budesonide-formoterol (SYMBICORT) 80-4.5 MCG/ACT inhaler Inhale 2 puffs into the lungs 2 (two) times daily.     [provider]  butalbital-acetaminophen-caffeine (FIORICET, ESGIC) 50-325-40 MG tablet Take 1 tablet by mouth 2 (two) times daily as needed for headache.    [provider]  clonazePAM (KLONOPIN) 0.5 MG tablet Take 0.5 mg by mouth 2 (two) times daily as needed for anxiety.  03/24/16   [provider]  desipramine (NORPRAMIN) 75 MG tablet Take 37.5 mg by mouth at bedtime.  03/29/16   [provider]  ferrous sulfate 325 (65 FE) MG tablet Take 325 mg by mouth 3 (three) times daily with meals.    [provider]  fluocinonide ointment (LIDEX) 0.05 % Apply 1 application topically at bedtime. Apply to finger tips    [provider]  FLUoxetine (PROZAC) 40 MG capsule Take 40 mg by mouth daily. 01/24/16   [provider]  fluticasone (FLONASE) 50 MCG/ACT nasal spray Place 1 spray into both nostrils daily.    [provider]  gabapentin (NEURONTIN) 300 MG capsule Take 300 mg by mouth 3 (three) times daily. 02/25/16   [provider]  HYDROcodone-acetaminophen (NORCO/VICODIN) 5-325 MG tablet Take 1 tablet by mouth every 8 (eight) hours as needed for pain. 03/30/16   [provider]  losartan (COZAAR) 25 MG tablet Take 50 mg by mouth daily. 03/13/16   [provider]  meclizine (ANTIVERT) 25 MG tablet Take 25 mg by mouth 3 (three) times daily as needed for dizziness. 02/06/16   [provider]  metoCLOPramide (REGLAN) 10 MG tablet Take 10 mg by mouth at bedtime as needed for nausea.  03/25/16   [provider]  montelukast (SINGULAIR) 10 MG tablet Take 10 mg by mouth daily.  02/25/16   [provider]  omeprazole (PRILOSEC) 20 MG capsule Take 20 mg by mouth daily. 03/09/16   [provider]  promethazine (PHENERGAN) 25 MG tablet Take 25 mg by mouth every 6 (six) hours as needed for nausea or vomiting.    [provider]  traZODone (DESYREL) 100 MG tablet Take 100 mg by mouth at  bedtime. 01/24/16   [provider]    Family History  Problem Relation Age of Onset  . CAD Father   . Colon cancer Father      Social History   Tobacco Use  . Smoking status: Never Smoker  . Smokeless tobacco: Never Used    Allergies as of 12/09/2020 - Review Complete 12/09/2020  Allergen Reaction Noted  . Sulfa antibiotics Rash 03/30/2016    Review of Systems:    All systems reviewed and negative except where noted in HPI.   Physical Exam:  BP 123/66 (BP Location: Left Arm, Patient Position: Sitting, Cuff Size: Normal)   Pulse 62   Temp 98.4 F (36.9 C) (Oral)   Ht 5\' 2"  (1.575 m)   Wt 118 lb 2 oz (53.6 kg)   BMI 21.61 kg/m  No LMP recorded. Patient has had a hysterectomy. Psych:  Alert and cooperative. Normal mood and affect. General:   Alert,  Well-developed, well-nourished, pleasant and cooperative in NAD Head:  Normocephalic and atraumatic. Pulses:  Normal pulses noted. Psych:  Alert and cooperative. Normal mood and affect.  Imaging Studies: No results found.  Assessment and Plan:   Brooke [name] is a 75 y.o. y/o female has been referred for fecal smearing but patient tells me she is here today for diarrhea.  Ongoing for 1 year.  Colonoscopy performed at [facility] within the past 1 year showed no abnormalities.  No biopsies were taken for microscopic colitis.  Plan 1.  Check celiac serology, stool test and if negative will treat as IBS.  If there is evidence of inflammation may need a colonoscopy to rule out microscopic colitis.  In the interim can use fiber pills and Imodium as needed for diarrhea  Follow up in 4 weeks video visit  Dr [facility] MD,MRCP(U.K)

## 2020-12-10 ENCOUNTER — Telehealth: Payer: Self-pay | Admitting: Gastroenterology

## 2020-12-10 LAB — CELIAC DISEASE AB SCREEN W/RFX
Antigliadin Abs, IgA: 4 units (ref 0–19)
IgA/Immunoglobulin A, Serum: 207 mg/dL (ref 64–422)
Transglutaminase IgA: <2 U/mL (ref 0–3)

## 2020-12-10 NOTE — Telephone Encounter (Signed)
Stop the diphenoxylate atropine, commence on Imodium 2 mg 3 times a day, await stool studies

## 2020-12-10 NOTE — Telephone Encounter (Signed)
Patient called wants to know if Dr Tobi Bastos can send a medication in for diarrhea.  Patient states that she was nervous yesterday and forgot to ask during her office visit. Patient states she has been taking Diphenoxylate- attrop.  Please call to advise

## 2020-12-11 NOTE — Telephone Encounter (Signed)
Informed patient of Dr. Johnney Killian recommendations. Per Dr. Tobi Bastos: Stop the diphenoxylate atropine, commence on Imodium 2 mg 3 times a day, await stool studies. Pt verbalized understanding.

## 2020-12-13 LAB — GI PROFILE, STOOL, PCR
Cryptosporidium: NOT DETECTED
Cyclospora cayetanensis: NOT DETECTED
Rotavirus A: NOT DETECTED
Yersinia enterocolitica: NOT DETECTED

## 2020-12-13 NOTE — Progress Notes (Signed)
200 mg twice daily for 14 days

## 2020-12-15 LAB — GI PROFILE, STOOL, PCR
Adenovirus F 40/41: NOT DETECTED
Astrovirus: NOT DETECTED
C difficile toxin A/B: DETECTED — AB
Campylobacter: NOT DETECTED
Entamoeba histolytica: NOT DETECTED
Enteroaggregative E coli: NOT DETECTED
Enteropathogenic E coli: NOT DETECTED
Enterotoxigenic E coli: NOT DETECTED
Giardia lamblia: NOT DETECTED
Norovirus GI/GII: NOT DETECTED
Plesiomonas shigelloides: NOT DETECTED
Salmonella: NOT DETECTED
Sapovirus: NOT DETECTED
Shiga-toxin-producing E coli: NOT DETECTED
Shigella/Enteroinvasive E coli: NOT DETECTED
Vibrio cholerae: NOT DETECTED
Vibrio: NOT DETECTED

## 2020-12-15 LAB — C DIFFICILE, CYTOTOXIN B

## 2020-12-15 LAB — CALPROTECTIN, FECAL: Calprotectin, Fecal: 61 ug/g (ref 0–120)

## 2020-12-15 LAB — C DIFFICILE TOXINS A+B W/RFLX: C difficile Toxins A+B, EIA: NEGATIVE

## 2020-12-24 ENCOUNTER — Telehealth: Payer: Self-pay | Admitting: Gastroenterology

## 2020-12-24 NOTE — Telephone Encounter (Signed)
Patient called and stated that Imodium is not working for her. Please call to advise.

## 2020-12-24 NOTE — Telephone Encounter (Signed)
What dose is she taking? 

## 2020-12-25 NOTE — Telephone Encounter (Signed)
Patient called second time, taking 3 fluid ounces 3 times daily, states it is not working

## 2020-12-27 ENCOUNTER — Telehealth: Payer: Self-pay

## 2020-12-27 ENCOUNTER — Other Ambulatory Visit: Payer: Self-pay

## 2020-12-27 DIAGNOSIS — A0472 Enterocolitis due to Clostridium difficile, not specified as recurrent: Secondary | ICD-10-CM

## 2020-12-27 MED ORDER — VANCOMYCIN HCL 125 MG PO CAPS: 125.0000 mg | ORAL_CAPSULE | Freq: Four times a day (QID) | ORAL | 0 refills | Status: AC

## 2020-12-27 MED ORDER — FIDAXOMICIN 200 MG PO TABS: 200.0000 mg | ORAL_TABLET | Freq: Two times a day (BID) | ORAL | 0 refills | Status: DC

## 2020-12-27 NOTE — Telephone Encounter (Signed)
-----   Message from Wyline Mood, MD sent at 12/27/2020 10:46 AM EDT ----- Regarding: RE: Medication Change to vancomycin 125 mg 4 times daily for 14 days ----- Message ----- From: Wilnette Kales, CMA Sent: 12/27/2020  10:34 AM EDT To: Wyline Mood, MD Subject: Medication                                     Patient just called and said the medication is going to cost her $1400. Can you prescribe something else?

## 2020-12-27 NOTE — Progress Notes (Signed)
Patient has been informed antibiotic has been sent to pharmacy.

## 2020-12-27 NOTE — Telephone Encounter (Signed)
Patient has been informed of new prescription being sent to pharmacy.

## 2021-01-02 ENCOUNTER — Telehealth: Payer: Self-pay

## 2021-01-02 NOTE — Telephone Encounter (Signed)
Patient was given Xifaxan because she was having diarrhea. Patient was told not to take Imodium. Patient having the diarrhea 8 times but is still taking Xifaxan

## 2021-01-03 NOTE — Telephone Encounter (Signed)
Brooke [name]  She is supposed to be on vancomycin 125mg  4 times daily. Please confirm. If she has already started taking vancomycin, she can try imodium as needed every 6-8 hrs as long as she is not having abdominal pain, nausea, vomiting, fever.  RV

## 2021-01-06 ENCOUNTER — Telehealth: Payer: Self-pay | Admitting: Gastroenterology

## 2021-01-06 DIAGNOSIS — R197 Diarrhea, unspecified: Secondary | ICD-10-CM

## 2021-01-06 NOTE — Addendum Note (Signed)
Addended by: Adela Ports on: 01/06/2021 02:30 PM   Modules accepted: Orders

## 2021-01-06 NOTE — Telephone Encounter (Signed)
Let's recheck her stool studies Check CBC, CMP  RV

## 2021-01-06 NOTE — Telephone Encounter (Signed)
Patient still having diarrhea. Please call to advise

## 2021-01-06 NOTE — Telephone Encounter (Signed)
Patient was contacted and she stated that she is still taking her antibioitic Vancomycin as prescribed and that she is continuing to take Imodium as recommended and still having diarrhea. Patient stated that she has a bowel movement at least 6-8 times a day and that her stools are watery and clear since she is not able to hold anything down including water. Patient denies any abdominal pain, nausea, vomiting or fever. I recommended for her to continue taking here antibiotic, Imodium, drink Gatorade so she doesn't get dehydrated and start doing the BRAT Diet until I get an advise from the physician. Patient understood and had no further questions at this time.  Can you please advise what else the patient could do. Thank you.

## 2021-01-06 NOTE — Telephone Encounter (Signed)
Patient requests call back please. Wants info on diet.

## 2021-01-06 NOTE — Telephone Encounter (Signed)
Patient was contacted with Dr. Verdis Prime recommendations and she stated that she would go to [facility] and have her blood drawn and collect the stool supplies so she could turn in. Patient understood and had no further questions.

## 2021-01-06 NOTE — Telephone Encounter (Signed)
Called patient back and explained that she should continue with the BRAT diet as this would help her with the diarrhea. Patient understood and agreed.

## 2021-01-11 LAB — GI PROFILE, STOOL, PCR

## 2021-01-11 LAB — COMPREHENSIVE METABOLIC PANEL
ALT: 6 IU/L (ref 0–32)
AST: 15 IU/L (ref 0–40)
Albumin/Globulin Ratio: 1.8 (ref 1.2–2.2)
Albumin: 4 g/dL (ref 3.7–4.7)
Alkaline Phosphatase: 71 IU/L (ref 44–121)
BUN/Creatinine Ratio: 17 (ref 12–28)
BUN: 15 mg/dL (ref 8–27)
Bilirubin Total: 0.3 mg/dL (ref 0.0–1.2)
CO2: 27 mmol/L (ref 20–29)
Calcium: 9.1 mg/dL (ref 8.7–10.3)
Chloride: 98 mmol/L (ref 96–106)
Creatinine, Ser: 0.86 mg/dL (ref 0.57–1.00)
Globulin, Total: 2.2 g/dL (ref 1.5–4.5)
Glucose: 84 mg/dL (ref 65–99)
Potassium: 4.2 mmol/L (ref 3.5–5.2)
Sodium: 137 mmol/L (ref 134–144)
Total Protein: 6.2 g/dL (ref 6.0–8.5)
eGFR: 70 mL/min/{1.73_m2} (ref >59)

## 2021-01-11 LAB — CBC
Hematocrit: 36.2 % (ref 34.0–46.6)
Hemoglobin: 12.1 g/dL (ref 11.1–15.9)
MCH: 30.6 pg (ref 26.6–33.0)
MCHC: 33.4 g/dL (ref 31.5–35.7)
MCV: 91 fL (ref 79–97)
Platelets: 221 10*3/uL (ref 150–450)
RBC: 3.96 x10E6/uL (ref 3.77–5.28)
RDW: 12.7 % (ref 11.7–15.4)
WBC: 5.1 10*3/uL (ref 3.4–10.8)

## 2021-01-13 NOTE — Progress Notes (Signed)
Thanks Dr Maximino Greenland, Kandis Cocking can you check if patient still has diarrhea or not and if finished antibiotics I gavce her before I went on vacation

## 2021-01-14 ENCOUNTER — Telehealth: Payer: Self-pay

## 2021-01-14 NOTE — Telephone Encounter (Signed)
Called patient and asked her how she was doing. Patient stated that she was doing better once she went back on her hydrocodone. Patient stated that she started having diarrhea when she had chosen to stop her hydrocodone. However, she developed diarrhea but when she went back to taking it again, her diarrhea stopped. I told patient that maybe her body was going through a withdraw and that was one of her symptoms. Patient also stated that she had finished all of her antibiotics. I told patient to give [place] a call back in case she developed and abdominal pan, diarrhea, constipation, nausea or vomiting. Patient understood and had no further questions.

## 2021-01-14 NOTE — Telephone Encounter (Signed)
-----   Message from Wyline Mood, MD sent at 01/13/2021 12:08 PM EDT ----- Thanks Dr Janyth Contes can you check if patient still has diarrhea or not and if finished antibiotics I gavce her before I went on vacation

## 2021-01-30 ENCOUNTER — Ambulatory Visit: Payer: Medicare Other | Admitting: Gastroenterology

## 2021-08-05 ENCOUNTER — Ambulatory Visit: Payer: Medicare Other | Admitting: Internal Medicine

## 2021-09-08 ENCOUNTER — Ambulatory Visit: Payer: Medicare Other | Admitting: Internal Medicine

## 2022-05-04 ENCOUNTER — Ambulatory Visit: Payer: Medicare Other | Admitting: Nurse Practitioner

## 2022-05-04 NOTE — Progress Notes (Deleted)
There were no vitals taken for this visit.   Subjective:    Patient ID: Brooke [name], female    DOB: 1946/04/18, 76 y.o.   MRN: [id number]  HPI: Brooke [name] is a 76 y.o. female  No chief complaint on file.  Patient presents to clinic to establish care with new PCP.  Introduced to Designer, jewellery role and practice setting.  All questions answered.  Discussed provider/patient relationship and expectations.  Patient reports a history of ***. Patient denies a history of: Hypertension, Elevated Cholesterol, Diabetes, Thyroid problems, Depression, Anxiety, Neurological problems, and Abdominal problems.   Active Ambulatory Problems    Diagnosis Date Noted   UTI (lower urinary tract infection) 03/31/2016   Sepsis ([place]) 05/11/2016   Fever of unknown origin 05/11/2016   Resolved Ambulatory Problems    Diagnosis Date Noted   No Resolved Ambulatory Problems   Past Medical History:  Diagnosis Date   Asthma    Past Surgical History:  Procedure Laterality Date   PARTIAL HYSTERECTOMY     TEMPORAL ARTERY BIOPSY / LIGATION     Family History  Problem Relation Age of Onset   CAD Father    Colon cancer Father      Review of Systems  Per HPI unless specifically indicated above     Objective:    There were no vitals taken for this visit.  Wt Readings from Last 3 Encounters:  12/09/20 118 lb 2 oz (53.6 kg)  05/12/16 139 lb 5.3 oz (63.2 kg)  04/02/16 137 lb 9.6 oz (62.4 kg)    Physical Exam  Results for orders placed or performed in visit on 01/06/21  Comprehensive Metabolic Panel (CMET)  Result Value Ref Range   Glucose 84 65 - 99 mg/dL   BUN 15 8 - 27 mg/dL   Creatinine, Ser 0.86 0.57 - 1.00 mg/dL   eGFR 70 >59 mL/min/1.73   BUN/Creatinine Ratio 17 12 - 28   Sodium 137 134 - 144 mmol/L   Potassium 4.2 3.5 - 5.2 mmol/L   Chloride 98 96 - 106 mmol/L   CO2 27 20 - 29 mmol/L   Calcium 9.1 8.7 - 10.3 mg/dL   Total Protein 6.2 6.0 - 8.5 g/dL   Albumin 4.0 3.7 - 4.7  g/dL   Globulin, Total 2.2 1.5 - 4.5 g/dL   Albumin/Globulin Ratio 1.8 1.2 - 2.2   Bilirubin Total 0.3 0.0 - 1.2 mg/dL   Alkaline Phosphatase 71 44 - 121 IU/L   AST 15 0 - 40 IU/L   ALT 6 0 - 32 IU/L  CBC  Result Value Ref Range   WBC 5.1 3.4 - 10.8 x10E3/uL   RBC 3.96 3.77 - 5.28 x10E6/uL   Hemoglobin 12.1 11.1 - 15.9 g/dL   Hematocrit 36.2 34.0 - 46.6 %   MCV 91 79 - 97 fL   MCH 30.6 26.6 - 33.0 pg   MCHC 33.4 31.5 - 35.7 g/dL   RDW 12.7 11.7 - 15.4 %   Platelets 221 150 - 450 x10E3/uL  GI Profile, Stool, PCR  Result Value Ref Range   Campylobacter Not Detected Not Detected   C difficile toxin A/B Detected (A) Not Detected   Plesiomonas shigelloides Not Detected Not Detected   Salmonella Not Detected Not Detected   Vibrio Not Detected Not Detected   Vibrio cholerae Not Detected Not Detected   Yersinia enterocolitica Not Detected Not Detected   Enteroaggregative E coli Not Detected Not Detected   Enteropathogenic E  coli Not Detected Not Detected   Enterotoxigenic E coli Not Detected Not Detected   Shiga-toxin-producing E coli Not Detected Not Detected   E coli V948 Not applicable Not Detected   Shigella/Enteroinvasive E coli Not Detected Not Detected   Cryptosporidium Not Detected Not Detected   Cyclospora cayetanensis Not Detected Not Detected   Entamoeba histolytica Not Detected Not Detected   Giardia lamblia Not Detected Not Detected   Adenovirus F 40/41 Not Detected Not Detected   Astrovirus Not Detected Not Detected   Norovirus GI/GII Not Detected Not Detected   Rotavirus A Not Detected Not Detected   Sapovirus Not Detected Not Detected      Assessment & Plan:   Problem List Items Addressed This Visit   None    Follow up plan: No follow-ups on file.

## 2022-05-05 DIAGNOSIS — Z79899 Other long term (current) drug therapy: Secondary | ICD-10-CM | POA: Insufficient documentation

## 2023-04-01 ENCOUNTER — Ambulatory Visit: Payer: Self-pay

## 2023-04-01 NOTE — Telephone Encounter (Signed)
  Chief Complaint: daughter called reporting cough and pt using Albuterol more than prescribed.  Symptoms: cough Frequency: last night Pertinent Negatives: Patient denies  Unable to get in touch with pt or pt husband Disposition: [x] ED /[] Urgent Care (no appt availability in office) / [] Appointment(In office/virtual)/ []  [facility]/ [] Home Care/ [] Refused Recommended Disposition /[] [facility]/ []  Follow-up with PCP Additional Notes: [name] not on DPR and is not with pt. Called pt's number and unable to LM on VM "mailbox full"- called [name] back and advised to get pt to ED- daughter stated her mother is stubborn and wont go to ED. She stated pt may go to UC. Advised that could call 911 to have pt evaluated and pt and EMS to decide what to do next. Achille Rich to calle her brother to go see pt and take to ED/UC or call 911. [name] verbalized understanding. Pt has a new pt appt. Achille Rich we wont see pt for acute issues prior to NP appt.  Answer Assessment - Initial Assessment Questions 1. RESPIRATORY STATUS: "Describe your breathing?" (e.g., wheezing, shortness of breath, unable to speak, severe coughing)      coughing 2. ONSET: "When did this breathing problem begin?"      Unable to get in touch with pt or pt husband 3. PATTERN "Does the difficult breathing come and go, or has it been constant since it started?"       Unable to get in touch with pt or pt husband 4. SEVERITY: "How bad is your breathing?" (e.g., mild, moderate, severe)    - MILD: No SOB at rest, mild SOB with walking, speaks normally in sentences, can lie down, no retractions, pulse < 100.    - MODERATE: SOB at rest, SOB with minimal exertion and prefers to sit, cannot lie down flat, speaks in phrases, mild retractions, audible wheezing, pulse 100-120.    - SEVERE: Very SOB at rest, speaks in single words, struggling to breathe, sitting hunched forward, retractions, pulse > 120       Unable to get in  touch with pt or pt husband 5. RECURRENT SYMPTOM: "Have you had difficulty breathing before?" If Yes, ask: "When was the last time?" and "What happened that time?"       Unable to get in touch with pt or pt husband 6. CARDIAC HISTORY: "Do you have any history of heart disease?" (e.g., heart attack, angina, bypass surgery, angioplasty)       Unable to get in touch with pt or pt husband 7. LUNG HISTORY: "Do you have any history of lung disease?"  (e.g., pulmonary embolus, asthma, emphysema)     yes 8. CAUSE: "What do you think is causing the breathing problem?"       Unable to get in touch with pt or pt husband 9. OTHER SYMPTOMS: "Do you have any other symptoms? (e.g., dizziness, runny nose, cough, chest pain, fever)     Off balance, cough   10. O2 SATURATION MONITOR:  "Do you use an oxygen saturation monitor (pulse oximeter) at home?" If Yes, ask: "What is your reading (oxygen level) today?" "What is your usual oxygen saturation reading?" (e.g., 95%)       94   120/70 68- using Albuterol  Protocols used: Breathing Difficulty-A-AH

## 2023-04-13 ENCOUNTER — Ambulatory Visit: Admission: RE | Admit: 2023-04-13 | Payer: Medicare Other | Source: Home / Self Care

## 2023-04-13 ENCOUNTER — Ambulatory Visit
Admission: RE | Admit: 2023-04-13 | Discharge: 2023-04-13 | Disposition: A | Discharge status: Home / Self Care | Payer: Medicare Other | Source: Ambulatory Visit | Attending: Pediatrics | Admitting: Pediatrics

## 2023-04-13 ENCOUNTER — Ambulatory Visit (INDEPENDENT_AMBULATORY_CARE_PROVIDER_SITE_OTHER): Payer: Medicare Other | Admitting: Pediatrics

## 2023-04-13 ENCOUNTER — Encounter: Payer: Self-pay | Admitting: Pediatrics

## 2023-04-13 VITALS — BP 136/87 | HR 73 | Wt 125.2 lb

## 2023-04-13 DIAGNOSIS — F419 Anxiety disorder, unspecified: Secondary | ICD-10-CM

## 2023-04-13 DIAGNOSIS — G500 Trigeminal neuralgia: Secondary | ICD-10-CM

## 2023-04-13 DIAGNOSIS — H66011 Acute suppurative otitis media with spontaneous rupture of ear drum, right ear: Secondary | ICD-10-CM | POA: Diagnosis not present

## 2023-04-13 DIAGNOSIS — H9041 Sensorineural hearing loss, unilateral, right ear, with unrestricted hearing on the contralateral side: Secondary | ICD-10-CM

## 2023-04-13 DIAGNOSIS — R051 Acute cough: Secondary | ICD-10-CM

## 2023-04-13 DIAGNOSIS — J449 Chronic obstructive pulmonary disease, unspecified: Secondary | ICD-10-CM | POA: Diagnosis not present

## 2023-04-13 DIAGNOSIS — R011 Cardiac murmur, unspecified: Secondary | ICD-10-CM

## 2023-04-13 DIAGNOSIS — Z7689 Persons encountering health services in other specified circumstances: Secondary | ICD-10-CM

## 2023-04-13 DIAGNOSIS — F325 Major depressive disorder, single episode, in full remission: Secondary | ICD-10-CM

## 2023-04-13 MED ORDER — BREZTRI AEROSPHERE 160-9-4.8 MCG/ACT IN AERO
INHALATION_SPRAY | RESPIRATORY_TRACT | 3 refills | Status: DC
Start: 2023-04-13 — End: 2023-06-02

## 2023-04-13 MED ORDER — CLONAZEPAM 0.5 MG PO TABS
0.5000 mg | ORAL_TABLET | Freq: Two times a day (BID) | ORAL | 0 refills | Status: DC | PRN
Start: 2023-04-13 — End: 2023-05-12

## 2023-04-13 MED ORDER — BUTALBITAL-APAP-CAFFEINE 50-325-40 MG PO TABS
2.0000 | ORAL_TABLET | Freq: Two times a day (BID) | ORAL | 0 refills | Status: DC | PRN
Start: 2023-04-13 — End: 2023-05-12

## 2023-04-13 MED ORDER — AMOXICILLIN-POT CLAVULANATE 875-125 MG PO TABS: 1.0000 | ORAL_TABLET | Freq: Two times a day (BID) | ORAL | 0 refills | Status: DC

## 2023-04-13 NOTE — Progress Notes (Signed)
Establish Care Note  BP 136/87   Pulse 73   Wt 125 lb 3.2 oz (56.8 kg)   SpO2 97%   BMI 22.90 kg/m    Subjective:    Patient ID: Brooke [name], female    DOB: Dec 28, 1945, 77 y.o.   MRN: [id number]  HPI: Brooke [name] is a 77 y.o. female  Chief Complaint  Patient presents with   Ear Problem    Patient says she has blood in her R ear and says she cannot clean it out because it bleeds. Patient says the ear gets better and then it starts to get worse again. Patient first starting having issues since surgery back in 2017.    Asthma   Asthma COPD Long standing issue for her Has had increased coughing for a month Albuterol doesn't help She has been using breztri daily and helps a lot  No recent hospitalizations Has not had new sputum production Requesting refills  Ear problem Long standing issue for her Had surgery around ear in 2017 and has since had recurrent infections and hearing loss of the R ear Has not seen ENT She reports new symptoms of the R ear, has had some discharge and blood come out of it for the last few weeks Denies any fevers, chills, nausea, vomiting She was given drops for her ear but is not sure what they are called Painful when moving a lot  Relevant past medical, surgical, family and social history reviewed and updated as indicated. Interim medical history since our last visit reviewed. Allergies and medications reviewed and updated.  ROS per HPI unless specifically indicated above     Objective:    BP 136/87   Pulse 73   Wt 125 lb 3.2 oz (56.8 kg)   SpO2 97%   BMI 22.90 kg/m   Wt Readings from Last 3 Encounters:  04/13/23 125 lb 3.2 oz (56.8 kg)  12/09/20 118 lb 2 oz (53.6 kg)  05/12/16 139 lb 5.3 oz (63.2 kg)    Physical Exam: GEN: alert, cooperative, and in NAD HENT: atraumatic, normocephalic, R ear mild tenderness to manipulation, with dried blood, canal erythematous, unable to visualize TM, dried cerum vs pus visualized  EYES:  anicteric sclera  CV: hemodynamically stable, RRR, holosystolic murmur appreciated, no rubs, gallops RESP: breathing comfortably on room air, no wheezing, crackles, rales GI/ABD: soft, non-tender, non-distended  EXT: warm and well perfused, no lower extremity edema, neurovascularly intact PSYCH: normal behavior and appropriate affect  Assessment & Plan:  Assessment & Plan   Caddie was seen today for ear problem and asthma.  Diagnoses and all orders for this visit:  Acute suppurative otitis media of right ear with spontaneous rupture of tympanic membrane, recurrence not specified Will need to do further chart review but appears to be chronic issue with recurrence for several years. Unable to visualize TM today, suspect rupture and had erythematous canal with dried pus vs cerumen. Some tenderness with exam as well. Plan to treat with extended course of augmentin. She may benefit from seeing ENT in the future if recurs. -     amoxicillin-clavulanate (AUGMENTIN) 875-125 MG tablet; Take 1 tablet by mouth 2 (two) times daily.  Acute cough Chronic obstructive pulmonary disease, unspecified COPD type (HCC) Suspect chronic. Does not meet criteria for copd exacerbation, but given hx plan to get CXR today after discussing with patient and husband. Refills sent below. Pt given strict return precautions and anticipatory guidance for ED. -  DG Chest 2 View; Future -     Budeson-Glycopyrrol-Formoterol (BREZTRI AEROSPHERE) 160-9-4.8 MCG/ACT AERO; Daily as needed  Trigeminal neuralgia Chronic medication as below. There was also fiorinal on her medication list but reported this was the one being used which doesn't coincide with records and PDMP. Will have pharmacy call to confirm, sent refills below. -     butalbital-acetaminophen-caffeine (FIORICET) 50-325-40 MG tablet; Take 2 tablets by mouth 2 (two) times daily as needed for headache.  Sensorineural hearing loss (SNHL) of right ear with unrestricted  hearing of left ear Having new haring changes on right and left ear. Will re-evaluate diagnosis as above. -     Ambulatory referral to Audiology  Murmur Not previously documented. Recommend echo, referral to cards sent. -     Ambulatory referral to Cardiology  Major depressive disorder with single episode, in full remission (HCC) Anxiety Chronic medication as below. On chart review, appears well controlled on her current regimen. Similar to other medications above, the records from [facility] do not match up with her medications. Last outpt note reporting: Fluoxetine 60mg  daily, Clonazepam 0.5mg  BID; Trazodone 100mg  nightly. Unclear if still on norpramine and ?dose of gabapentin but reported should be active during our visit today. Again, will have pharmacy check medication reconciliation. Discussed risks with her current psychiatric regimen and importance of close follow up. No changes to be made today since it is initial encounter. Precautions and medication safety reviewed with patient and husband at bedside. -     clonazePAM (KLONOPIN) 0.5 MG tablet; Take 1 tablet (0.5 mg total) by mouth 2 (two) times daily as needed for anxiety.  Encounter to establish care Reviewed history, medications, and problem list. Active issues above. Overdue for medicare annual.  Follow up plan: Return in about 1 month (around 05/13/2023) for medicare annual visit.  Shanen Norris Howell Pringle, MD

## 2023-04-13 NOTE — Patient Instructions (Addendum)
Plan: - sent your refills, let me know if anything is missing - xray - [place] - sent you cardiology referral, they can do the ultrasound of your heart - sent audiology referral  Sent antibiotic to take - take 2 times daily for 10 days

## 2023-04-14 ENCOUNTER — Telehealth: Payer: Self-pay

## 2023-04-14 NOTE — Progress Notes (Signed)
   04/14/2023  Patient ID: Brooke [name], female   DOB: 1946-05-23, 77 y.o.   MRN: [id number]  Outreach attempt to schedule a telephone visit for medication review, Breztri administration education and PAP eligibility discussion at request of patient's PCP, Dr. Evelene Croon.  I tried to call both the patient and Mr. Overbeck but did not get an answer, and both of their voicemail boxes were full.  I will attempt to reach them again in 1-2 days.  [name], PharmD, DPLA

## 2023-04-20 ENCOUNTER — Other Ambulatory Visit: Payer: Medicare Other

## 2023-04-20 ENCOUNTER — Telehealth: Payer: Self-pay

## 2023-04-20 NOTE — Telephone Encounter (Signed)
Copied from CRM [id number]. Topic: General - Other >> Apr 20, 2023  8:47 AM Phill Myron wrote: Attn: [name], PharmD, DPLA ... Please call again today

## 2023-04-20 NOTE — Progress Notes (Signed)
04/20/2023  Patient ID: Brooke [name], female   DOB: 1946/04/08, 76 y.o.   MRN: [id number]  Outreach attempt to schedule telephone visit for medication review, Markus Daft administration counseling and affordability assistance.  Tried to call patient and spouse but did not get an answer, and both of their voicemails are full.  I will try to contact again next week.  [name], PharmD, DPLA

## 2023-04-21 ENCOUNTER — Other Ambulatory Visit: Payer: Medicare Other

## 2023-04-21 NOTE — Progress Notes (Signed)
04/21/2023  Patient ID: Brooke [name], female   DOB: [birth year], 77 y.o.   MRN: [id number]  S/O Telephone visit to assist with affordability of Breztri   Medication Access/Adherence -Completed medication review, and patient states she is currently not using Breztri or albuterol -States reason for not using is not associated with cost, but that she has not needed them -Has albuterol on hand if needed but does not endorse any recent SOB, wheezing, coughing -No recently documented COPD exacerbations  A/P  Medication Access/Adherence -Consulting Dr. Evelene Croon regarding reported non-use of Breztri -Patient would qualify for patient assistance program for [facility] through [place] if therapy is needed  [name], PharmD, DPLA

## 2023-04-26 ENCOUNTER — Ambulatory Visit (INDEPENDENT_AMBULATORY_CARE_PROVIDER_SITE_OTHER): Payer: Medicare Other

## 2023-04-26 ENCOUNTER — Telehealth: Payer: Self-pay | Admitting: Pediatrics

## 2023-04-26 DIAGNOSIS — Z23 Encounter for immunization: Secondary | ICD-10-CM | POA: Diagnosis not present

## 2023-04-26 NOTE — Telephone Encounter (Unsigned)
Copied from CRM [id number]. Topic: Referral - Question >> Apr 26, 2023  9:35 AM Phill Myron wrote: Reason for CRM: Patient stated she does not need an audiologist, her hearing has already been tested. She stated she may need an ENT doctor.   She can not hear, possibly due to a clogged ear.

## 2023-04-26 NOTE — Telephone Encounter (Signed)
Medication Refill - Medication: butalbital-acetaminophen-caffeine (FIORICET) 50-325-40 MG tablet [[id number]]  Has the patient contacted their pharmacy? Yes.      Preferred Pharmacy (with phone number or street name):   [id number] - [place], [place] - [address]  [address], [place] [address]  Phone:  [phone]  Fax:  [phone]  DEA #:  [id number]    Has the patient been seen for an appointment in the last year OR does the patient have an upcoming appointment? Yes.    Agent: Please be advised that RX refills may take up to 3 business days. We ask that you follow-up with your pharmacy.

## 2023-04-27 ENCOUNTER — Other Ambulatory Visit: Payer: Self-pay | Admitting: Pediatrics

## 2023-04-27 DIAGNOSIS — H6693 Otitis media, unspecified, bilateral: Secondary | ICD-10-CM

## 2023-04-27 NOTE — Telephone Encounter (Signed)
Attempted to reach patient, no answer and full VM

## 2023-04-27 NOTE — Progress Notes (Signed)
Referral placed per patient request vs audiology

## 2023-05-12 ENCOUNTER — Encounter: Payer: Self-pay | Admitting: Pediatrics

## 2023-05-12 ENCOUNTER — Ambulatory Visit (INDEPENDENT_AMBULATORY_CARE_PROVIDER_SITE_OTHER): Payer: Medicare Other | Admitting: Pediatrics

## 2023-05-12 VITALS — BP 137/86 | HR 80 | Wt 120.2 lb

## 2023-05-12 DIAGNOSIS — G500 Trigeminal neuralgia: Secondary | ICD-10-CM

## 2023-05-12 DIAGNOSIS — F325 Major depressive disorder, single episode, in full remission: Secondary | ICD-10-CM

## 2023-05-12 DIAGNOSIS — Z1330 Encounter for screening examination for mental health and behavioral disorders, unspecified: Secondary | ICD-10-CM

## 2023-05-12 DIAGNOSIS — F419 Anxiety disorder, unspecified: Secondary | ICD-10-CM

## 2023-05-12 MED ORDER — CLONAZEPAM 0.5 MG PO TABS
0.5000 mg | ORAL_TABLET | Freq: Two times a day (BID) | ORAL | 3 refills | Status: DC | PRN
Start: 2023-05-12 — End: 2023-08-24

## 2023-05-12 MED ORDER — FLUOXETINE HCL 20 MG PO CAPS
40.0000 mg | ORAL_CAPSULE | Freq: Two times a day (BID) | ORAL | 1 refills | Status: DC
Start: 2023-05-12 — End: 2023-11-04

## 2023-05-12 MED ORDER — FLUOXETINE HCL 20 MG PO CAPS
20.0000 mg | ORAL_CAPSULE | Freq: Every day | ORAL | 3 refills | Status: DC
Start: 2023-05-12 — End: 2023-05-12

## 2023-05-12 MED ORDER — FLUOXETINE HCL 40 MG PO CAPS
ORAL_CAPSULE | ORAL | 3 refills | Status: DC
Start: 2023-05-12 — End: 2023-05-12

## 2023-05-12 MED ORDER — BUTALBITAL-ASPIRIN-CAFFEINE 50-325-40 MG PO CAPS
1.0000 | ORAL_CAPSULE | Freq: Two times a day (BID) | ORAL | 1 refills | Status: DC | PRN
Start: 2023-05-12 — End: 2023-07-14

## 2023-05-12 MED ORDER — BUTALBITAL-ASPIRIN-CAFFEINE 50-325-40 MG PO CAPS
1.0000 | ORAL_CAPSULE | Freq: Every day | ORAL | 3 refills | Status: DC | PRN
Start: 2023-05-12 — End: 2023-05-12

## 2023-05-12 MED ORDER — BUSPIRONE HCL 5 MG PO TABS
5.0000 mg | ORAL_TABLET | Freq: Two times a day (BID) | ORAL | 0 refills | Status: DC | PRN
Start: 2023-05-12 — End: 2023-05-19

## 2023-05-12 NOTE — Progress Notes (Signed)
Office Visit  BP 137/86   Pulse 80   Wt 120 lb 3.2 oz (54.5 kg)   SpO2 96%   BMI 21.98 kg/m    Subjective:    Patient ID: Brooke [name], female    DOB: 18-Oct-1945, 77 y.o.   MRN: [id number]  HPI: Brooke [name] is a 77 y.o. female  Chief Complaint  Patient presents with   Diarrhea    Patient daughter says patient has diarrhea every time she eats a meal. Patient says this has been an ongoing issue for years. Patient is prescribed medication from GI provider and says the medication is no longer helping. Patient would like to discuss with provider different treatment options.    Anxiety    Patient daughter says the patient feels her nerves have gotten worse. Patient loss her second son back in March. Patient says she would like to discuss different treatment options for her Anxiety. Patient is requesting a refill on Klonopin, unless the provider wants different medication.    Medication Management    Patient says she was prescribed for Migraines and says it is not helping.   #Diarrhea Pt has chronic diarrhea, previously seen by GI  Previously on lomotil without success  Last colonoscopy was 2021. Was recommended for repeat 3 years. Patient unaware of this She notes her diarrhea has been worse, has not been back to specialist  #Migraines She had duplicate rx for fiorinal and fioricet for migraine and trigeminal neurologia management She notes the fioricet not working, requesting to return to fiorinal  #Anxiety Stopped gabapentin on her own, feels like it made her high Did not titrate down Has had a lot of breakthrough anxiety since then She is interested in medication adjustments  Relevant past medical, surgical, family and social history reviewed and updated as indicated. Interim medical history since our last visit reviewed. Allergies and medications reviewed and updated.  ROS per HPI unless specifically indicated above     Objective:    BP 137/86   Pulse 80   Wt  120 lb 3.2 oz (54.5 kg)   SpO2 96%   BMI 21.98 kg/m   Wt Readings from Last 3 Encounters:  05/12/23 120 lb 3.2 oz (54.5 kg)  04/13/23 125 lb 3.2 oz (56.8 kg)  12/09/20 118 lb 2 oz (53.6 kg)    Physical Exam Constitutional:      Appearance: Normal appearance.  Pulmonary:     Effort: Pulmonary effort is normal.  Musculoskeletal:        General: Normal range of motion.  Skin:    Comments: Normal skin color  Neurological:     General: No focal deficit present.     Mental Status: She is alert. Mental status is at baseline.  Psychiatric:        Mood and Affect: Mood normal.        Behavior: Behavior normal.        Thought Content: Thought content normal.        05/12/2023   11:32 AM  Depression screen PHQ 2/9  Decreased Interest 3  Down, Depressed, Hopeless 2  PHQ - 2 Score 5  Altered sleeping 0  Tired, decreased energy 3  Change in appetite 3  Feeling bad or failure about yourself  2  Trouble concentrating 2  Moving slowly or fidgety/restless 1  Suicidal thoughts 0  PHQ-9 Score 16  Difficult doing work/chores Somewhat difficult       05/12/2023   11:32 AM  GAD 7 : Generalized Anxiety Score  Nervous, Anxious, on Edge 3  Control/stop worrying 3  Worry too much - different things 3  Trouble relaxing 3  Restless 2  Easily annoyed or irritable 2  Afraid - awful might happen 0  Total GAD 7 Score 16  Anxiety Difficulty Somewhat difficult      Assessment & Plan:  Assessment & Plan   Major depressive disorder with single episode, in full remission (HCC) -     FLUoxetine HCl; Take 2 capsules (40 mg total) by mouth 2 (two) times daily.  Dispense: 120 capsule; Refill: 1 -     clonazePAM; Take 1 tablet (0.5 mg total) by mouth 2 (two) times daily as needed for anxiety.  Dispense: 60 tablet; Refill: 3  Trigeminal neuralgia Assessment & Plan: Managed with fiorinal, had duplicate order for fioricet which was sent yesterday. Plan to switch back. Had lengthy discussion  regarding safety profile of medication and attempting to reduce the amount taken to reduce complications including bleeding from ASA component. Does not want to adjust today but will discuss further at follow up.  Orders: -     Butalbital-Aspirin-Caffeine; Take 1 capsule by mouth 2 (two) times daily as needed for headache or migraine.  Dispense: 60 capsule; Refill: 1  Anxiety Assessment & Plan: Having lots of breakthrough anxiety suspect from discontinuing her gabapentin abruptly. She declines switching to another agent would like increase prozac dose to 40mg  BID. Educated patient that 80mg /day dose not well studied and may not help. She opts for this along with trial of buspar. Emphasized danger of increasing klonopin and will hold off for now.  Orders: -     busPIRone HCl; Take 1 tablet (5 mg total) by mouth 2 (two) times daily as needed.  Dispense: 60 tablet; Refill: 0 -     clonazePAM; Take 1 tablet (0.5 mg total) by mouth 2 (two) times daily as needed for anxiety.  Dispense: 60 tablet; Refill: 3  Encounter for behavioral health screening As part of their intake evaluation, the patient was screened for depression, anxiety.  PHQ9 SCORE 16, GAD7 SCORE 16. Screening results positive for tested conditions. See plan under problem/diagnosis above.   Follow up plan: Return in about 2 weeks (around 05/26/2023) for Mood.  Lyndie Vanderloop Howell Pringle, MD

## 2023-05-12 NOTE — Assessment & Plan Note (Signed)
Having lots of breakthrough anxiety suspect from discontinuing her gabapentin abruptly. She declines switching to another agent would like increase prozac dose to 40mg  BID. Educated patient that 80mg /day dose not well studied and may not help. She opts for this along with trial of buspar. Emphasized danger of increasing klonopin and will hold off for now.

## 2023-05-12 NOTE — Patient Instructions (Addendum)
Increase prozac to 80mg . Can do 2pills twice day of the 20mg . I will send buspar 5mg  as needed for breakthrough anxiety - you don't need to take this if you do not want  I will message your GI doctor, please also schedule follow up with him  I will send the migraine medication again

## 2023-05-12 NOTE — Assessment & Plan Note (Signed)
Managed with fiorinal, had duplicate order for fioricet which was sent yesterday. Plan to switch back. Had lengthy discussion regarding safety profile of medication and attempting to reduce the amount taken to reduce complications including bleeding from ASA component. Does not want to adjust today but will discuss further at follow up.

## 2023-05-13 ENCOUNTER — Ambulatory Visit: Payer: Medicare Other | Admitting: Pediatrics

## 2023-05-19 ENCOUNTER — Ambulatory Visit (INDEPENDENT_AMBULATORY_CARE_PROVIDER_SITE_OTHER): Payer: Medicare Other | Admitting: Pediatrics

## 2023-05-19 ENCOUNTER — Encounter: Payer: Self-pay | Admitting: Pediatrics

## 2023-05-19 VITALS — BP 139/82 | HR 70 | Temp 98.5°F | Wt 121.8 lb

## 2023-05-19 DIAGNOSIS — B370 Candidal stomatitis: Secondary | ICD-10-CM

## 2023-05-19 DIAGNOSIS — F419 Anxiety disorder, unspecified: Secondary | ICD-10-CM

## 2023-05-19 DIAGNOSIS — K582 Mixed irritable bowel syndrome: Secondary | ICD-10-CM

## 2023-05-19 MED ORDER — LOPERAMIDE HCL 2 MG PO TABS
2.0000 mg | ORAL_TABLET | Freq: Four times a day (QID) | ORAL | 0 refills | Status: DC | PRN
Start: 2023-05-19 — End: 2023-08-24

## 2023-05-19 MED ORDER — NYSTATIN 100000 UNIT/ML MT SUSP
5.0000 mL | Freq: Four times a day (QID) | OROMUCOSAL | 0 refills | Status: DC
Start: 2023-05-19 — End: 2023-05-21

## 2023-05-19 MED ORDER — BUSPIRONE HCL 10 MG PO TABS
10.0000 mg | ORAL_TABLET | Freq: Two times a day (BID) | ORAL | 3 refills | Status: DC
Start: 2023-05-19 — End: 2023-06-02

## 2023-05-19 NOTE — Patient Instructions (Addendum)
For your throat: - Take 5mL of nystatin suspension 3-4 times daily for 7 days. If having symptoms still, you can continue for up to 14 days or until we see each other again  For your anxiety: Ok to restart gabapentin and I will increase the buspar to 10mg  twice daily  For diarrhea: - lamodil 4 times daily and immodium 4 times daily

## 2023-05-19 NOTE — Progress Notes (Signed)
Office Visit  BP 139/82   Pulse 70   Temp 98.5 F (36.9 C) (Oral)   Wt 121 lb 12.8 oz (55.2 kg)   SpO2 97%   BMI 22.28 kg/m    Subjective:    Patient ID: Brooke [name], female    DOB: [birth year], 77 y.o.   MRN: [id number]  HPI: Brooke [name] is a 77 y.o. female  Chief Complaint  Patient presents with   Medication Management   Sore Throat    Pt states she has had a sore throat for 2 weeks now    #anxiety Started on buspar last visit, has felt some improvement in symptoms.  She had discontinued the gabapentin on her own last visit, would like to restart She has some at home  #diarrhea Has not yet contacted GI Takes lamotil once a day Chronic issue  Interested in new medication  #sore throat Has had throat dryness for months Denies fevers, chills, nausea, vomiting  Relevant past medical, surgical, family and social history reviewed and updated as indicated. Interim medical history since our last visit reviewed. Allergies and medications reviewed and updated.  ROS per HPI unless specifically indicated above     Objective:    BP 139/82   Pulse 70   Temp 98.5 F (36.9 C) (Oral)   Wt 121 lb 12.8 oz (55.2 kg)   SpO2 97%   BMI 22.28 kg/m   Wt Readings from Last 3 Encounters:  05/21/23 124 lb 12.8 oz (56.6 kg)  05/19/23 121 lb 12.8 oz (55.2 kg)  05/12/23 120 lb 3.2 oz (54.5 kg)    Physical Exam Constitutional:      Appearance: Normal appearance.  HENT:     Head: Normocephalic and atraumatic.     Mouth/Throat:     Tongue: Lesions present.     Comments: Pharynx with white film, multiple satelite lesions noted. Has dark patch overlaying it. Eyes:     Pupils: Pupils are equal, round, and reactive to light.  Cardiovascular:     Rate and Rhythm: Normal rate and regular rhythm.     Pulses: Normal pulses.     Heart sounds: Normal heart sounds.  Pulmonary:     Effort: Pulmonary effort is normal.     Breath sounds: Normal breath sounds.  Abdominal:      General: Abdomen is flat.     Palpations: Abdomen is soft.  Musculoskeletal:        General: Normal range of motion.     Cervical back: Normal range of motion.  Skin:    General: Skin is warm and dry.     Capillary Refill: Capillary refill takes less than 2 seconds.  Neurological:     General: No focal deficit present.     Mental Status: She is alert. Mental status is at baseline.  Psychiatric:        Mood and Affect: Mood normal.        Behavior: Behavior normal.        Assessment & Plan:  Assessment & Plan   Thrush Noted on exam. Plan on nystatin treatment.  Anxiety Will increase dose and then can restart gabapentin as below. Consider weaning off buspar. -     busPIRone HCl; Take 1 tablet (10 mg total) by mouth 2 (two) times daily.  Dispense: 180 tablet; Refill: 3  Irritable bowel syndrome with both constipation and diarrhea Was only taking once daily. Discussed going up on frequency. -     Loperamide  HCl; Take 1 tablet (2 mg total) by mouth 4 (four) times daily as needed for diarrhea or loose stools.  Dispense: 30 tablet; Refill: 0  Follow up plan: Return in about 2 weeks (around 06/02/2023) for Medicare AWV with Evelene Croon, thrush follow up.  Francesca Strome Howell Pringle, MD

## 2023-05-21 ENCOUNTER — Ambulatory Visit: Payer: Self-pay | Admitting: *Deleted

## 2023-05-21 ENCOUNTER — Ambulatory Visit: Payer: Medicare Other | Admitting: Family Medicine

## 2023-05-21 VITALS — BP 138/87 | HR 68 | Temp 98.3°F | Ht 59.45 in | Wt 124.8 lb

## 2023-05-21 DIAGNOSIS — R011 Cardiac murmur, unspecified: Secondary | ICD-10-CM | POA: Diagnosis not present

## 2023-05-21 DIAGNOSIS — T7840XA Allergy, unspecified, initial encounter: Secondary | ICD-10-CM | POA: Diagnosis not present

## 2023-05-21 MED ORDER — PREDNISONE 10 MG PO TABS: ORAL_TABLET | ORAL | 0 refills | Status: DC

## 2023-05-21 MED ORDER — FLUCONAZOLE 150 MG PO TABS
150.0000 mg | ORAL_TABLET | Freq: Once | ORAL | 0 refills | Status: AC
Start: 2023-05-21 — End: 2023-05-21

## 2023-05-21 MED ORDER — TRIAMCINOLONE ACETONIDE 40 MG/ML IJ SUSP
40.0000 mg | Freq: Once | INTRAMUSCULAR | Status: AC
Start: 2023-05-21 — End: 2023-05-21
  Administered 2023-05-21: 40 mg via INTRAMUSCULAR

## 2023-05-21 NOTE — Patient Instructions (Addendum)
Increase water intake to 3-4 water bottles each day  Take Benadryl tonight and each night  Start Prednisone tomorrow

## 2023-05-21 NOTE — Telephone Encounter (Addendum)
  Chief Complaint: Pt is having a reaction to the Nystatin suspension given for thrush by Dr Evelene Croon.   Pt. Requesting to only see Dr. Evelene Croon today. Symptoms: Lips, tongue swollen.   Throat is burning.   Started yesterday right after taking her first dose of the Nystatin.   Denies shortness of breath or chest pain.   ED precautions given. Frequency: Now   Started yesterday Pertinent Negatives: Patient denies shortness of breath or chest tightness. Disposition: [] ED /[] Urgent Care (no appt availability in office) / [] Appointment(In office/virtual)/ []  [facility]/ [] Home Care/ [] Refused Recommended Disposition /[] [place]/ [x]  Follow-up with PCP Additional Notes: She is requesting to only see Dr. Evelene Croon today.   I gave her ED precautions but she is insisting on only seeing Dr. Evelene Croon today.    No appts available.  She is unwilling to see another provider.   I let her know I would see if Dr. Evelene Croon could work her in today and someone would call her back.    She was agreeable to this.  I called into the practice and spoke with Heather.   Dr. Evelene Croon is not in today.  She switched her day.     I called pt back at her number [phone] and let the phone ring at least 10 times but there was no answer.    I called again rang a long time.  I tried one more time to call her still no answer after letting the phone ring multiple times.   No voicemail picks up.  I called 911 to her address [address] In [place] to check on her since she is not answering the phone.   I gave the 911 operator the information.   She is having a reaction to the Nystatin Suspension she was prescribed for thrush in her mouth.   After she took it her lips swelled up yesterday.   She called into [facility] this morning due to lips being swollen, her tongue and throat felt like they were swelling.   She also c/o her throat burning real bad.   She denied shortness of breath or chest  tightness.   She was talking with me coherently and clearly when I was on the phone with her.    I'm concerned that she is not answering the phone after multiple attempts and no answering machine picks up. [facility] and Sheriff being dispatched to her residence per 911 operator.  I called into [facility] and spoke with Herbert Seta again.   I let her know I had called 911 to check on Mrs. Zasada since she is not answering her phone and is having an allergic reaction to a medication in case they get a call from EMS or pt regarding this.   I was concerned about her being passed out, etc from the reaction.

## 2023-05-21 NOTE — Telephone Encounter (Signed)
  Chief Complaint: Appt Symptoms: NA Frequency: NA Pertinent Negatives: Patient denies NA Disposition: [] ED /[] Urgent Care (no appt availability in office) / [x] Appointment(In office/virtual)/ []  [place] [facility]/ [] Home Care/ [] Refused Recommended Disposition /[] [facility]/ []  Follow-up with PCP Additional Notes:   Pt states EMS and fire arrived to her home "I don't need them." No longer present. States throat is not swollen, "Sore" Speech is fluent. Reports lip swollen from yesterday, "Little" facial swelling. Appt secured for today with Ras[facility]. States benadryl helped last night. Advised to hydrate,benadryl, start with lowest dose, take other 1/2 if needed. Husband will drive to appt. Advised EMS if symptoms worsen,SOB, throat swelling.   ADvised nurse she spoke with earlier attempted to reach her, no answer and EMS was called out of concern. States number in demographics is wrong.  CB# [phone]. Changed in demographics. ----

## 2023-05-21 NOTE — Telephone Encounter (Signed)
Reason for Disposition  Patient sounds very sick or weak to the triager    Pt requesting to only see Dr. Evelene Croon.  Answer Assessment - Initial Assessment Questions 1. SYMPTOM: "What's the main symptom you're concerned about?" (e.g., chapped lips, dry mouth, lump, sores)     I'm having a reaction to the medicine Dr. Evelene Croon gave me for thrush on Wed.    My lips are swollen, my balance is off.   My tongue and throat feel swollen.    No shortness of breath or chest pain. Nystatin suspension 5 mg use 4 times a day.  My throat is so sore.   I can't eat.   2. ONSET: "When did the  symptoms  start?"     Yesterday I used the medicine for the thrush.   I took one dose and I swelled immediately.   3. PAIN: "Is there any pain?" If Yes, ask: "How bad is it?" (Scale: 1-10; mild, moderate, severe)   - MILD (1-3):  doesn't interfere with eating or normal activities   - MODERATE (4-7): interferes with eating some solids and normal activities   - SEVERE (8-10):  excruciating pain, interferes with most normal activities   - SEVERE DYSPHAGIA: can't swallow liquids, drooling     Burning in my throat 4. CAUSE: "What do you think is causing the symptoms?"     The thrush medicine 5. OTHER SYMPTOMS: "Do you have any other symptoms?" (e.g., fever, sore throat, toothache, swelling)     See above 6. PREGNANCY: "Is there any chance you are pregnant?" "When was your last menstrual period?"     Not asked  Protocols used: Mouth Symptoms-A-AH

## 2023-05-21 NOTE — Progress Notes (Unsigned)
BP 138/87   Pulse 68   Temp 98.3 F (36.8 C) (Oral)   Ht 4' 11.45" (1.51 m)   Wt 124 lb 12.8 oz (56.6 kg)   SpO2 96%   BMI 24.83 kg/m    Subjective:    Patient ID: Brooke [name], female    DOB: 07-20-1946, 77 y.o.   MRN: [id number]  HPI: Brooke [name] is a 77 y.o. female  Chief Complaint  Patient presents with   Medication Reaction    Occurred yesterday to Nystatin mouthwash   Yesterday she experienced a reaction to Nystatin after using it x2, initially she started to feel dizzy and noticed her lips started to get larger and swollen. She tried Benadryl and noticed some improvement. She also complains of a burning sensation in her throat. Reaction occurred at 2 pm yesterday.   She denies shortness of breath, chest pain, diarrhea, nausea, vomiting, abdominal pain. Denies itchiness, flushing, bronchospasm.  She is requesting new referral to cardiology with Dr. Okey Dupre in [place] for heart murmur.   Relevant past medical, surgical, family and social history reviewed and updated as indicated. Interim medical history since our last visit reviewed. Allergies and medications reviewed and updated.  Review of Systems  Constitutional:  Negative for fever.  HENT:  Positive for sore throat (burning sensation).        Edema of lips and jaw  Respiratory: Negative.    Cardiovascular: Negative.   Gastrointestinal:  Negative for abdominal pain, constipation, diarrhea, nausea and vomiting.    Per HPI unless specifically indicated above     Objective:    BP 138/87   Pulse 68   Temp 98.3 F (36.8 C) (Oral)   Ht 4' 11.45" (1.51 m)   Wt 124 lb 12.8 oz (56.6 kg)   SpO2 96%   BMI 24.83 kg/m   Wt Readings from Last 3 Encounters:  05/21/23 124 lb 12.8 oz (56.6 kg)  05/19/23 121 lb 12.8 oz (55.2 kg)  05/12/23 120 lb 3.2 oz (54.5 kg)    Physical Exam Vitals and nursing note reviewed.  Constitutional:      General: She is awake. She is not in acute distress.    Appearance: Normal  appearance. She is well-developed and well-groomed. She is not ill-appearing.  HENT:     Head: Normocephalic and atraumatic.     Right Ear: Hearing and external ear normal. No drainage. Tympanic membrane is not erythematous.     Left Ear: Hearing and external ear normal. No drainage. Tympanic membrane is not erythematous.     Nose: Nose normal. No nasal tenderness, congestion or rhinorrhea.     Mouth/Throat:     Pharynx: No pharyngeal swelling or posterior oropharyngeal erythema.     Comments: Edema of upper, lower lip and bilateral jaw Eyes:     General: Lids are normal.        Right eye: No discharge.        Left eye: No discharge.     Conjunctiva/sclera: Conjunctivae normal.  Cardiovascular:     Rate and Rhythm: Normal rate and regular rhythm.     Pulses:          Radial pulses are 2+ on the right side and 2+ on the left side.       Posterior tibial pulses are 2+ on the right side and 2+ on the left side.     Heart sounds: S1 normal and S2 normal. Murmur heard.     No gallop.  Pulmonary:     Effort: Pulmonary effort is normal. No accessory muscle usage or respiratory distress.     Breath sounds: Normal breath sounds.  Musculoskeletal:        General: Normal range of motion.     Cervical back: Full passive range of motion without pain and normal range of motion.     Right lower leg: No edema.     Left lower leg: No edema.  Skin:    General: Skin is warm and dry.     Capillary Refill: Capillary refill takes less than 2 seconds.  Neurological:     Mental Status: She is alert and oriented to person, place, and time.  Psychiatric:        Attention and Perception: Attention normal.        Mood and Affect: Mood normal.        Speech: Speech normal.        Behavior: Behavior normal. Behavior is cooperative.        Thought Content: Thought content normal.     Results for orders placed or performed in visit on 01/06/21  Comprehensive Metabolic Panel (CMET)  Result Value Ref  Range   Glucose 84 65 - 99 mg/dL   BUN 15 8 - 27 mg/dL   Creatinine, Ser 4.78 0.57 - 1.00 mg/dL   eGFR 70 >29 FA/OZH/0.86   BUN/Creatinine Ratio 17 12 - 28   Sodium 137 134 - 144 mmol/L   Potassium 4.2 3.5 - 5.2 mmol/L   Chloride 98 96 - 106 mmol/L   CO2 27 20 - 29 mmol/L   Calcium 9.1 8.7 - 10.3 mg/dL   Total Protein 6.2 6.0 - 8.5 g/dL   Albumin 4.0 3.7 - 4.7 g/dL   Globulin, Total 2.2 1.5 - 4.5 g/dL   Albumin/Globulin Ratio 1.8 1.2 - 2.2   Bilirubin Total 0.3 0.0 - 1.2 mg/dL   Alkaline Phosphatase 71 44 - 121 IU/L   AST 15 0 - 40 IU/L   ALT 6 0 - 32 IU/L  CBC  Result Value Ref Range   WBC 5.1 3.4 - 10.8 x10E3/uL   RBC 3.96 3.77 - 5.28 x10E6/uL   Hemoglobin 12.1 11.1 - 15.9 g/dL   Hematocrit 57.8 46.9 - 46.6 %   MCV 91 79 - 97 fL   MCH 30.6 26.6 - 33.0 pg   MCHC 33.4 31.5 - 35.7 g/dL   RDW 62.9 52.8 - 41.3 %   Platelets 221 150 - 450 x10E3/uL  GI Profile, Stool, PCR  Result Value Ref Range   Campylobacter Not Detected Not Detected   C difficile toxin A/B Detected (A) Not Detected   Plesiomonas shigelloides Not Detected Not Detected   Salmonella Not Detected Not Detected   Vibrio Not Detected Not Detected   Vibrio cholerae Not Detected Not Detected   Yersinia enterocolitica Not Detected Not Detected   Enteroaggregative E coli Not Detected Not Detected   Enteropathogenic E coli Not Detected Not Detected   Enterotoxigenic E coli Not Detected Not Detected   Shiga-toxin-producing E coli Not Detected Not Detected   E coli O157 Not applicable Not Detected   Shigella/Enteroinvasive E coli Not Detected Not Detected   Cryptosporidium Not Detected Not Detected   Cyclospora cayetanensis Not Detected Not Detected   Entamoeba histolytica Not Detected Not Detected   Giardia lamblia Not Detected Not Detected   Adenovirus F 40/41 Not Detected Not Detected   Astrovirus Not Detected Not Detected   Norovirus GI/GII  Not Detected Not Detected   Rotavirus A Not Detected Not Detected    Sapovirus Not Detected Not Detected      Assessment & Plan:   Problem List Items Addressed This Visit     Allergic drug reaction    Acute, stable. Reaction to Nystatin causing facial edema of lips and jaw. Will give Kenalog 40 MG IM today, and treat with 6 day prednisone taper. Recommend using Benadryl at bedtime. Return in 3 days. Call sooner if concerns arise. Instructed to go to ED for worsening symptoms.      Other Visit Diagnoses     Murmur    -  Primary   Relevant Orders   Ambulatory referral to Cardiology        Follow up plan: Return in about 3 days (around 05/24/2023) for Follow up.

## 2023-05-21 NOTE — Assessment & Plan Note (Addendum)
Acute, stable. Reaction to Nystatin causing facial edema of lips and jaw. Will give Kenalog 40 MG IM today, and treat with 6 day prednisone taper. Recommend using Benadryl at bedtime. Return in 3 days. Call sooner if concerns arise. Instructed to go to ED for worsening symptoms.

## 2023-05-22 ENCOUNTER — Encounter: Payer: Self-pay | Admitting: Pediatrics

## 2023-05-24 ENCOUNTER — Ambulatory Visit (INDEPENDENT_AMBULATORY_CARE_PROVIDER_SITE_OTHER): Payer: Medicare Other | Admitting: Family Medicine

## 2023-05-24 VITALS — BP 145/83 | HR 68 | Temp 98.3°F | Ht 59.45 in | Wt 119.4 lb

## 2023-05-24 DIAGNOSIS — T7840XD Allergy, unspecified, subsequent encounter: Secondary | ICD-10-CM

## 2023-05-24 NOTE — Patient Instructions (Addendum)
Stop Benadryl and start taking Zyrtec daily as needed: For the next two days; Can take this in the morning.   Take Fluconazole today along with plenty of water; 4-5 water bottles a day

## 2023-05-24 NOTE — Assessment & Plan Note (Addendum)
Acute, improved. Unable to tolerate Prednisone steroid taper. Recommend switch from Benadryl to Zyrtec daily, provided samples. Recommend drinking plenty of water to stay hydrated to help with mouth dryness. Instructed to take x1 Fluconazole for treatment of oral thrush d/t Nystatin intolerance.

## 2023-05-24 NOTE — Progress Notes (Signed)
BP (!) 145/83   Pulse 68   Temp 98.3 F (36.8 C) (Oral)   Ht 4' 11.45" (1.51 m)   Wt 119 lb 6.4 oz (54.2 kg)   SpO2 94%   BMI 23.75 kg/m    Subjective:    Patient ID: Brooke [name], female    DOB: [birth year], 77 y.o.   MRN: [id number]  HPI: Brooke [name] is a 77 y.o. female  Chief Complaint  Patient presents with   Follow-up    Allergic reaction   She is here for follow up of allergic reaction to Nystatin that caused edema of both lips and jaw 4 days ago.   She was not able to complete prednisone taper, it made her feel "bad", 'like I was going to pull my hair out". She completed 1 day course of Prednisone which consisted fo 60 MG. The burning sensation in her throat has improved, she still complains of soreness, she is able to eat and swallow liquids, hard, and soft foods without a problem. She denies shortness of breath, chest pain, diarrhea, nausea, vomiting, abdominal pain. Denies itchiness, flushing, bronchospasm. She feels her throat is "tight and dry".Denies trouble breathing.   Oral thrush has improved some, will advise to take x1 dose of Fluconazole today since unable to complete course of steroids.    Relevant past medical, surgical, family and social history reviewed and updated as indicated. Interim medical history since our last visit reviewed. Allergies and medications reviewed and updated.  Review of Systems  Constitutional:  Negative for fever.  HENT:  Positive for sore throat (Dryness of throat). Negative for facial swelling.        Dry mouth  Respiratory: Negative.    Cardiovascular: Negative.   Gastrointestinal:  Negative for abdominal pain, constipation, diarrhea, nausea and vomiting.    Per HPI unless specifically indicated above     Objective:    BP (!) 145/83   Pulse 68   Temp 98.3 F (36.8 C) (Oral)   Ht 4' 11.45" (1.51 m)   Wt 119 lb 6.4 oz (54.2 kg)   SpO2 94%   BMI 23.75 kg/m   Wt Readings from Last 3 Encounters:  05/24/23 119 lb  6.4 oz (54.2 kg)  05/21/23 124 lb 12.8 oz (56.6 kg)  05/19/23 121 lb 12.8 oz (55.2 kg)    Physical Exam Vitals and nursing note reviewed.  Constitutional:      General: She is awake. She is not in acute distress.    Appearance: Normal appearance. She is well-developed and well-groomed. She is not ill-appearing.  HENT:     Head: Normocephalic and atraumatic.     Right Ear: Hearing and external ear normal. No drainage. Tympanic membrane is not erythematous.     Left Ear: Hearing and external ear normal. No drainage. Tympanic membrane is not erythematous.     Nose: Nose normal. No nasal tenderness, congestion or rhinorrhea.     Mouth/Throat:     Pharynx: No pharyngeal swelling or posterior oropharyngeal erythema.  Eyes:     General: Lids are normal.        Right eye: No discharge.        Left eye: No discharge.     Conjunctiva/sclera: Conjunctivae normal.  Cardiovascular:     Rate and Rhythm: Normal rate and regular rhythm.     Pulses:          Radial pulses are 2+ on the right side and 2+ on the left side.  Posterior tibial pulses are 2+ on the right side and 2+ on the left side.     Heart sounds: S1 normal and S2 normal. Murmur heard.     No gallop.  Pulmonary:     Effort: Pulmonary effort is normal. No accessory muscle usage or respiratory distress.     Breath sounds: Normal breath sounds.  Musculoskeletal:        General: Normal range of motion.     Cervical back: Full passive range of motion without pain and normal range of motion.     Right lower leg: No edema.     Left lower leg: No edema.  Skin:    General: Skin is warm and dry.     Capillary Refill: Capillary refill takes less than 2 seconds.  Neurological:     Mental Status: She is alert and oriented to person, place, and time.  Psychiatric:        Attention and Perception: Attention normal.        Mood and Affect: Mood normal.        Speech: Speech normal.        Behavior: Behavior normal. Behavior is  cooperative.        Thought Content: Thought content normal.     Results for orders placed or performed in visit on 01/06/21  Comprehensive Metabolic Panel (CMET)  Result Value Ref Range   Glucose 84 65 - 99 mg/dL   BUN 15 8 - 27 mg/dL   Creatinine, Ser 7.82 0.57 - 1.00 mg/dL   eGFR 70 >95 AO/ZHY/8.65   BUN/Creatinine Ratio 17 12 - 28   Sodium 137 134 - 144 mmol/L   Potassium 4.2 3.5 - 5.2 mmol/L   Chloride 98 96 - 106 mmol/L   CO2 27 20 - 29 mmol/L   Calcium 9.1 8.7 - 10.3 mg/dL   Total Protein 6.2 6.0 - 8.5 g/dL   Albumin 4.0 3.7 - 4.7 g/dL   Globulin, Total 2.2 1.5 - 4.5 g/dL   Albumin/Globulin Ratio 1.8 1.2 - 2.2   Bilirubin Total 0.3 0.0 - 1.2 mg/dL   Alkaline Phosphatase 71 44 - 121 IU/L   AST 15 0 - 40 IU/L   ALT 6 0 - 32 IU/L  CBC  Result Value Ref Range   WBC 5.1 3.4 - 10.8 x10E3/uL   RBC 3.96 3.77 - 5.28 x10E6/uL   Hemoglobin 12.1 11.1 - 15.9 g/dL   Hematocrit 78.4 69.6 - 46.6 %   MCV 91 79 - 97 fL   MCH 30.6 26.6 - 33.0 pg   MCHC 33.4 31.5 - 35.7 g/dL   RDW 29.5 28.4 - 13.2 %   Platelets 221 150 - 450 x10E3/uL  GI Profile, Stool, PCR  Result Value Ref Range   Campylobacter Not Detected Not Detected   C difficile toxin A/B Detected (A) Not Detected   Plesiomonas shigelloides Not Detected Not Detected   Salmonella Not Detected Not Detected   Vibrio Not Detected Not Detected   Vibrio cholerae Not Detected Not Detected   Yersinia enterocolitica Not Detected Not Detected   Enteroaggregative E coli Not Detected Not Detected   Enteropathogenic E coli Not Detected Not Detected   Enterotoxigenic E coli Not Detected Not Detected   Shiga-toxin-producing E coli Not Detected Not Detected   E coli O157 Not applicable Not Detected   Shigella/Enteroinvasive E coli Not Detected Not Detected   Cryptosporidium Not Detected Not Detected   Cyclospora cayetanensis Not Detected Not Detected  Entamoeba histolytica Not Detected Not Detected   Giardia lamblia Not Detected Not  Detected   Adenovirus F 40/41 Not Detected Not Detected   Astrovirus Not Detected Not Detected   Norovirus GI/GII Not Detected Not Detected   Rotavirus A Not Detected Not Detected   Sapovirus Not Detected Not Detected      Assessment & Plan:   Problem List Items Addressed This Visit     Allergic drug reaction - Primary    Acute, improved. Unable to tolerate Prednisone steroid taper. Recommend switch from Benadryl to Zyrtec daily, provided samples. Recommend drinking plenty of water to stay hydrated to help with mouth dryness. Instructed to take x1 Fluconazole for treatment of oral thrush d/t Nystatin intolerance.         Follow up plan: Return if symptoms worsen or fail to improve.

## 2023-05-27 ENCOUNTER — Ambulatory Visit: Payer: Self-pay

## 2023-05-27 NOTE — Telephone Encounter (Signed)
Chief Complaint: Need help with when to take medications Disposition: [] ED /[] Urgent Care (no appt availability in office) / [] Appointment(In office/virtual)/ []  [facility]/ [x] Home Care/ [] Refused Recommended Disposition /[] [facility]/ []  Follow-up with PCP Additional Notes: Patient says she doesn't know if she is supposed to take all her medications in the morning at the same time and would like to know if she's waiting long enough in between. I asked her to explain what she means, is she talking about the one's due more than once a day. She says in the morning when she gets up she takes them all at the same time and wonders if that's ok to do. Advised with the medication on the list, it's ok to take them at the same time in the morning. She was still not understanding and says she will just discuss with Dr. Evelene Croon when she comes in for her physical. Advised to bring all the bottles of pills she takes to the visit so that the provider could review them with her in the office and advise on what needs to be taken at what time. Patient verbalized understanding.    Summary: Medication and how to take them   Brooke [name]  would like help with how to take her medications     Reason for Disposition  Caller has medicine question only, adult not sick, AND triager answers question  Answer Assessment - Initial Assessment Questions 1. NAME of MEDICINE: "What medicine(s) are you calling about?"     All of her medications 2. QUESTION: "What is your question?" (e.g., double dose of medicine, side effect)     Is it ok to take them all at the same time in the morning  Protocols used: Medication Question Call-A-AH

## 2023-05-31 NOTE — Progress Notes (Signed)
   05/31/2023  Patient ID: Brooke [name], female   DOB: Nov 09, 1945, 77 y.o.   MRN: [id number]  Patient outreach to request that Brooke [name] bring in all medications to her upcoming visit with Dr. Evelene Croon 10/30 at 1pm.  I am going to join Dr. Evelene Croon for a portion of the visit to review her medications to verify she has everything needed and address any questions/concerns/needs related to her medication regimen.  [name], PharmD, DPLA

## 2023-06-01 ENCOUNTER — Ambulatory Visit: Payer: Medicare Other | Admitting: Pediatrics

## 2023-06-02 ENCOUNTER — Ambulatory Visit: Payer: Medicare Other | Admitting: Pediatrics

## 2023-06-02 ENCOUNTER — Encounter: Payer: Self-pay | Admitting: Pediatrics

## 2023-06-02 ENCOUNTER — Other Ambulatory Visit: Payer: Medicare Other

## 2023-06-02 VITALS — BP 121/85 | HR 73 | Temp 98.0°F | Ht 59.0 in | Wt 118.0 lb

## 2023-06-02 DIAGNOSIS — R42 Dizziness and giddiness: Secondary | ICD-10-CM | POA: Diagnosis not present

## 2023-06-02 DIAGNOSIS — Z0000 Encounter for general adult medical examination without abnormal findings: Secondary | ICD-10-CM | POA: Diagnosis not present

## 2023-06-02 DIAGNOSIS — B370 Candidal stomatitis: Secondary | ICD-10-CM | POA: Diagnosis not present

## 2023-06-02 DIAGNOSIS — F419 Anxiety disorder, unspecified: Secondary | ICD-10-CM

## 2023-06-02 DIAGNOSIS — K582 Mixed irritable bowel syndrome: Secondary | ICD-10-CM | POA: Diagnosis not present

## 2023-06-02 MED ORDER — DIPHENOXYLATE-ATROPINE 2.5-0.025 MG PO TABS: 2.0000 | ORAL_TABLET | Freq: Four times a day (QID) | ORAL | 3 refills | Status: AC | PRN

## 2023-06-02 MED ORDER — FLUCONAZOLE 150 MG PO TABS
150.0000 mg | ORAL_TABLET | Freq: Every day | ORAL | 0 refills | Status: AC
Start: 2023-06-02 — End: 2023-06-09

## 2023-06-02 MED ORDER — GABAPENTIN 100 MG PO CAPS
100.0000 mg | ORAL_CAPSULE | Freq: Two times a day (BID) | ORAL | 3 refills | Status: DC
Start: 2023-06-02 — End: 2023-11-08

## 2023-06-02 NOTE — Progress Notes (Unsigned)
Subjective:   Brooke [name] is a 77 y.o. female who presents for an Initial Medicare Annual Wellness Visit.  Visit Complete: In person  Patient Medicare AWV questionnaire was completed by the patient on 06/02/23; I have confirmed that all information answered by patient is correct and no changes since this date.  Patient additionally had the following concerns:  #dizziness Patient has been self adjusting medications and is not sure what has caused this She had initially had this resolve after discontinuation of gabapentin but opted to restart last visit due to severe anxiety after discontinuation Since restarting it, she reports she has had more dizziness  #anxiety Stopped taking buspar on her own, previously reported improvement of anxiety symptoms on it See above She is on max therapy of duloxetine  #IBS Has not increased lomotil, well controlled Has not seen her GI doctor yet, has not called as previously instructed  #thrush Did not tolerate nystatin, completed one dose of diflucan She reports feels throat is better but overall not noticed a difference        Objective:    Today's Vitals   06/02/23 1305  BP: 121/85  Pulse: 73  Temp: 98 F (36.7 C)  TempSrc: Oral  SpO2: 97%  Weight: 118 lb (53.5 kg)  Height: 4\' 11"  (1.499 m)  PainSc: 0-No pain   Body mass index is 23.83 kg/m.  Physical Exam Constitutional:      Appearance: Normal appearance.  HENT:     Head: Normocephalic and atraumatic.     Mouth/Throat:     Pharynx: Oropharyngeal exudate present.     Comments: White film, improved but ongoing Eyes:     Pupils: Pupils are equal, round, and reactive to light.  Cardiovascular:     Rate and Rhythm: Normal rate and regular rhythm.     Pulses: Normal pulses.     Heart sounds: Normal heart sounds.  Pulmonary:     Effort: Pulmonary effort is normal.     Breath sounds: Normal breath sounds.  Abdominal:     General: Abdomen is flat.     Palpations:  Abdomen is soft.  Musculoskeletal:        General: Normal range of motion.     Cervical back: Normal range of motion.  Skin:    General: Skin is warm and dry.     Capillary Refill: Capillary refill takes less than 2 seconds.  Neurological:     General: No focal deficit present.     Mental Status: She is alert. Mental status is at baseline.     Cranial Nerves: No cranial nerve deficit.     Sensory: No sensory deficit.     Motor: No weakness.     Coordination: Coordination normal.  Psychiatric:        Mood and Affect: Mood normal.        Behavior: Behavior normal.    Declines advance directive conversation at this time.     05/11/2016    5:18 AM 03/31/2016    2:20 AM 03/30/2016    9:15 PM  Advanced Directives  Does Patient Have a Medical Advance Directive? No No No  Would patient like information on creating a medical advance directive? No - patient declined information No - patient declined information No - patient declined information    Current Medications (verified) Outpatient Encounter Medications as of 06/02/2023  Medication Sig   albuterol (PROVENTIL HFA;VENTOLIN HFA) 108 (90 Base) MCG/ACT inhaler Inhale 2 puffs into the lungs every  6 (six) hours as needed for wheezing or shortness of breath. (Patient not taking: Reported on 06/02/2023)   butalbital-aspirin-caffeine (FIORINAL) 50-325-40 MG capsule Take 1 capsule by mouth 2 (two) times daily as needed for headache or migraine.   clonazePAM (KLONOPIN) 0.5 MG tablet Take 1 tablet (0.5 mg total) by mouth 2 (two) times daily as needed for anxiety.   [EXPIRED] fluconazole (DIFLUCAN) 150 MG tablet Take 1 tablet (150 mg total) by mouth daily for 7 days.   FLUoxetine (PROZAC) 20 MG capsule Take 2 capsules (40 mg total) by mouth 2 (two) times daily.   gabapentin (NEURONTIN) 100 MG capsule Take 1 capsule (100 mg total) by mouth 2 (two) times daily.   ipratropium (ATROVENT) 0.06 % nasal spray Place into both nostrils.   loperamide  (IMODIUM A-D) 2 MG tablet Take 1 tablet (2 mg total) by mouth 4 (four) times daily as needed for diarrhea or loose stools.   losartan (COZAAR) 25 MG tablet Take 50 mg by mouth daily.   omeprazole (PRILOSEC) 20 MG capsule Take 20 mg by mouth daily.   traZODone (DESYREL) 100 MG tablet Take 100 mg by mouth at bedtime.   [DISCONTINUED] busPIRone (BUSPAR) 10 MG tablet Take 1 tablet (10 mg total) by mouth 2 (two) times daily. (Patient not taking: Reported on 06/02/2023)   [DISCONTINUED] diphenoxylate-atropine (LOMOTIL) 2.5-0.025 MG tablet Take 2 tablets by mouth 4 (four) times daily.   [DISCONTINUED] predniSONE (DELTASONE) 10 MG tablet Take 6 tablets on day 1, 5 tablets on day 2, 4 tablets on day 3, 3 tablets on day 4, 2 tablets on day 5, and 1 tablet on day 6.   diphenoxylate-atropine (LOMOTIL) 2.5-0.025 MG tablet Take 2 tablets by mouth 4 (four) times daily as needed for diarrhea or loose stools.   [DISCONTINUED] amoxicillin-clavulanate (AUGMENTIN) 875-125 MG tablet Take 1 tablet by mouth 2 (two) times daily. (Patient not taking: Reported on 05/12/2023)   [DISCONTINUED] Budeson-Glycopyrrol-Formoterol (BREZTRI AEROSPHERE) 160-9-4.8 MCG/ACT AERO Daily as needed (Patient not taking: Reported on 04/21/2023)   No facility-administered encounter medications on file as of 06/02/2023.    Allergies (verified) Nystatin and Sulfa antibiotics   History: Past Medical History:  Diagnosis Date   Asthma    Past Surgical History:  Procedure Laterality Date   PARTIAL HYSTERECTOMY     TEMPORAL ARTERY BIOPSY / LIGATION     Family History  Problem Relation Age of Onset   CAD Father    Colon cancer Father    Social History   Socioeconomic History   Marital status: Married    Spouse name: Not on file   Number of children: Not on file   Years of education: Not on file   Highest education level: Not on file  Occupational History   Not on file  Tobacco Use   Smoking status: Never   Smokeless tobacco:  Never  Substance and Sexual Activity   Alcohol use: Not on file   Drug use: Not on file   Sexual activity: Not on file  Other Topics Concern   Not on file  Social History Narrative   Not on file   Social Determinants of Health   Financial Resource Strain: Not on file  Food Insecurity: No Food Insecurity (06/02/2023)   Hunger Vital Sign    Worried About Running Out of Food in the Last Year: Never true    Ran Out of Food in the Last Year: Never true  Transportation Needs: No Transportation Needs (02/05/2022)   Received  from [facility], [facility]   [facility] - Transportation    Lack of Transportation (Medical): No    Lack of Transportation (Non-Medical): No  Physical Activity: Not on file  Stress: Not on file  Social Connections: Socially Integrated (06/02/2023)   Social Connection and Isolation Panel [NHANES]    Frequency of Communication with Friends and Family: Twice a week    Frequency of Social Gatherings with Friends and Family: Twice a week    Attends Religious Services: More than 4 times per year    Active Member of [employer] or Organizations: Yes    Attends Banker Meetings: 1 to 4 times per year    Marital Status: Married    Tobacco Counseling Counseling given: Not Answered   Clinical Intake:     Pain Score: 0-No pain                  Activities of Daily Living    06/02/2023    1:11 PM  In your present state of health, do you have any difficulty performing the following activities:  Hearing? 0  Vision? 0  Difficulty concentrating or making decisions? 0  Walking or climbing stairs? 0  Dressing or bathing? 0  Doing errands, shopping? 0    Patient Care Team: Jackolyn Confer, MD as PCP - General (Family Medicine) [name], [facility] (Pharmacist)  Indicate any recent Medical Services you may have received from other than [facility] providers in the past year (date may be approximate).     Assessment:   This is a routine  wellness examination for Maleiya.  Hearing/Vision screen No results found.   Goals Addressed   None    Depression Screen    06/02/2023    1:17 PM 05/24/2023   12:00 PM 05/19/2023    1:13 PM 05/12/2023   11:32 AM  PHQ 2/9 Scores  PHQ - 2 Score 0 2 0 5  PHQ- 9 Score 0 5 0 16    Fall Risk    06/02/2023    1:17 PM 05/19/2023    1:13 PM 05/12/2023   11:28 AM  Fall Risk   Falls in the past year? 0 0 0  Number falls in past yr: 0 0 0  Injury with Fall? 0 0 0  Risk for fall due to : No Fall Risks No Fall Risks No Fall Risks  Follow up Falls evaluation completed Falls evaluation completed Falls evaluation completed    MEDICARE RISK AT HOME: polypharmacy, mood, stress, mobility    TIMED UP AND GO:  Was the test performed? Yes  Length of time to ambulate 10 feet: 12 sec Gait slow and steady without use of assistive device    Cognitive Function:        06/02/2023    1:13 PM  6CIT Screen  What Year? 0 points  What month? 0 points  What time? 0 points  Count back from 20 0 points  Months in reverse 0 points  Repeat phrase 0 points  Total Score 0 points    Immunizations Immunization History  Administered Date(s) Administered   Fluad Trivalent(High Dose 65+) 04/26/2023   Influenza Split 04/11/2014   Influenza, Seasonal, Injecte, Preservative Fre 06/02/2002, 07/14/2005, 06/05/2009, 07/29/2010, 04/25/2012   Influenza,inj,Quad PF,6+ Mos 04/14/2013, 04/17/2016, 05/31/2020   Influenza-Unspecified 05/10/2014, 05/28/2015, 08/20/2016, 04/27/2017, 05/05/2018, 04/04/2019, 05/13/2021   PFIZER(Purple Top)SARS-COV-2 Vaccination 09/13/2019, 10/04/2019, 05/03/2020   Pfizer(Comirnaty)Fall Seasonal Vaccine 12 years and older 04/26/2023   Pneumococcal Conjugate-13  11/01/2013   Pneumococcal Polysaccharide-23 07/29/2010   Td (Adult),unspecified 01/23/2002   Tdap 10/21/2010, 02/15/2017    TDAP status: Up to date  Flu Vaccine status: Up to date  Pneumococcal vaccine status:  Declined,  Education has been provided regarding the importance of this vaccine but patient still declined. Advised may receive this vaccine at local pharmacy or Health Dept. Aware to provide a copy of the vaccination record if obtained from local pharmacy or Health Dept. Verbalized acceptance and understanding.   Covid-19 vaccine status: Completed vaccines  Qualifies for Shingles Vaccine? Yes   Zostavax completed No   Shingrix Completed?: No.    Education has been provided regarding the importance of this vaccine. Patient has been advised to call insurance company to determine out of pocket expense if they have not yet received this vaccine. Advised may also receive vaccine at local pharmacy or Health Dept. Verbalized acceptance and understanding.  Screening Tests Health Maintenance  Topic Date Due   Hepatitis C Screening  Never done   Zoster Vaccines- Shingrix (1 of 2) Never done   DEXA SCAN  Never done   Pneumonia Vaccine 79+ Years old (3 of 3 - PPSV23 or PCV20) 11/02/2018   COVID-19 Vaccine (5 - 2023-24 season) 06/21/2023   Medicare Annual Wellness (AWV)  06/01/2024   DTaP/Tdap/Td (3 - Td or Tdap) 02/16/2027   INFLUENZA VACCINE  Completed   HPV VACCINES  Aged Out   Colonoscopy  Discontinued    Health Maintenance  Health Maintenance Due  Topic Date Due   Hepatitis C Screening  Never done   Zoster Vaccines- Shingrix (1 of 2) Never done   DEXA SCAN  Never done   Pneumonia Vaccine 38+ Years old (3 of 3 - PPSV23 or PCV20) 11/02/2018    Colorectal cancer screening: No longer required.   Mammogram status: No longer required due to age.  Bone Density status: Ordered in the past, patient declines. Pt provided with contact info and advised to call to schedule appt.  Lung Cancer Screening: (Low Dose CT Chest recommended if Age 57-80 years, 20 pack-year currently smoking OR have quit w/in 15years.) does not qualify.   Lung Cancer Screening Referral: not applicable  Additional  Screening:  Hepatitis C Screening: does qualify; Completed previously (negative)  Vision Screening: Recommended annual ophthalmology exams for early detection of glaucoma and other disorders of the eye. Is the patient up to date with their annual eye exam?  No  Who is the provider or what is the name of the office in which the patient attends annual eye exams? Does not have one If pt is not established with a provider, would they like to be referred to a provider to establish care? No .   Dental Screening: Recommended annual dental exams for proper oral hygiene  Diabetic Foot Exam: not applicable  Community Resource Referral / Chronic Care Management: CRR required this visit?  No   CCM required this visit?  No     Plan:     Irritable bowel syndrome with both constipation and diarrhea Stable diarrhea on increased frequency of dosing. Refills sent. Emphasized importance of following up with GI. -     Diphenoxylate-Atropine; Take 2 tablets by mouth 4 (four) times daily as needed for diarrhea or loose stools.  Dispense: 60 tablet; Refill: 3  Anxiety Dizziness Patient with acute on chronic anxiety with recent exacerbation due to medication noncompliance and self titrations. Additionally having dizziness since restarting gabapentin. Plan to reduce dose. We discussed  possibility of polypharmacy vs neurologic issues (stroke, TIA, etc) as possible cause and opts to start with pharmacy consult. Exam at baseline. Given prior h/o vestibular issues, may also have some component of vertigo contributing. Clinical pharmacist to meet with patient. Pt given strict return precautions and anticipatory guidance for ED.  -     Gabapentin; Take 1 capsule (100 mg total) by mouth 2 (two) times daily.  Dispense: 90 capsule; Refill: 3  Thrush Improved on exam but white film present on exam. Plan on extended course of difcluan. -     Fluconazole; Take 1 tablet (150 mg total) by mouth daily for 7 days.   Dispense: 7 tablet; Refill: 0  Encounter for Medicare annual wellness exam I have personally reviewed and noted the following in the patient's chart:   Medical and social history Use of alcohol, tobacco or illicit drugs  Current medications and supplements including opioid prescriptions. Patient is not currently taking opioid prescriptions. Functional ability and status Nutritional status Physical activity Advanced directives List of other physicians Hospitalizations, surgeries, and ER visits in previous 12 months Vitals Screenings to include cognitive, depression, and falls Referrals and appointments  In addition, I have reviewed and discussed with patient certain preventive protocols, quality metrics, and best practice recommendations. A written personalized care plan for preventive services as well as general preventive health recommendations were provided to patient.     Demiyah Fischbach Howell Pringle, MD   06/10/2023   After Visit Summary: (In Person-Printed) AVS printed and given to the patient

## 2023-06-02 NOTE — Progress Notes (Signed)
   06/02/2023  Patient ID: Brooke [name], female   DOB: 08-02-1946, 77 y.o.   MRN: [id number]  S/O Patient presenting today along with husband for PCP visit followed by medication review  Medication Review -Patient reports feeling "woozy, dizzy, and drunk-like" upon waking up on a daily basis -She has self discontinued/restarted medications on occasion -Presents today with medications she is currently taking; these are in the labeled pharmacy vials as well as  a weekly pill organizer and small pill box -Completed full review of medication list and medications brought in by patient; variances include: Patient states she is currently not using any inhalers listed on her medication list.  She does not endorse any s/sx of wheezing, SOB, or cough.  Documented COPD, but patient has not had recent exacerbations.  Patient is no longer taking buspirone She does take fiorinal and clonazepam BID scheduled versus PRN She also endorses taking loperamide >BID  Taking fluoxetine 20mg  + 40mg  capsules daily to equal 60mg  -Patient states she has only been spacing BID medications out by approximately 6-7 hours versus 12  A/P  Medication Review -Discussed medications that could be causing/worsening dizziness:  trazodone, gabapentin, clonazepam, Fiorinal -Patient states trazodone 100mg  is helping with sleep, so she will continue this medication at bedtime -Discussed possibility of decreased Fiorinal to once daily, but she states this helps with pain in her face and both doses still leave some residual pain -Dr. Evelene Croon decreased gabapentin to 100mg  BID -Patient endorses 60mg  dose of fluoxetine is effective for her to treat s/sx of anxiety/depression -I recommend only using albuterol rescue inhaler if needed; Breztri not necessary at this time -Dr. Evelene Croon prescribed fluconazole 150mg  daily x7d for thrush.  No drug interactions with her current medications identified. Counseled patient to take at the same time  daily, but it does not matter whether morning or evening -Loperamide increased to QID PRN -I recommend patient fill with pharmacy that offers free adherence packaging and home delivery (such as [facility]), but she is not interested in these services at this time.  Told her/husband to let me know if they change their mind; I can assist with transfer process. -Patient will begin spacing BID medications out by 12 hours  Follow-up:  As needed after follow-up with PCP next week  [name], PharmD, DPLA

## 2023-06-02 NOTE — Patient Instructions (Addendum)
Brooke [name] , Thank you for taking time to come for your Medicare Wellness Visit. I appreciate your ongoing commitment to your health goals. Please review the following plan we discussed and let me know if I can assist you in the future.   Gabapentin change: - 100mg  twice daily - new rx sent  For oral fungal infections: Daily 150mg  for 7 days  These are the goals we discussed:  Goals   None     This is a list of the screening recommended for you and due dates:  Health Maintenance  Topic Date Due   Medicare Annual Wellness Visit  Never done   Hepatitis C Screening  Never done   Zoster (Shingles) Vaccine (1 of 2) Never done   DEXA scan (bone density measurement)  Never done   Pneumonia Vaccine (3 of 3 - PPSV23 or PCV20) 11/02/2018   COVID-19 Vaccine (5 - 2023-24 season) 06/21/2023   DTaP/Tdap/Td vaccine (3 - Td or Tdap) 02/16/2027   Flu Shot  Completed   HPV Vaccine  Aged Out   Colon Cancer Screening  Discontinued

## 2023-06-07 ENCOUNTER — Telehealth: Payer: Self-pay | Admitting: Pediatrics

## 2023-06-07 NOTE — Telephone Encounter (Signed)
Copied from CRM [id number]. Topic: General - Inquiry >> Jun 07, 2023 11:19 AM Lennox Pippins wrote: Patient has called, requesting to speak to Pharmacist, Sabino Niemann. Please follow back up with patient @ #  [phone].

## 2023-06-08 ENCOUNTER — Ambulatory Visit: Payer: Self-pay

## 2023-06-08 ENCOUNTER — Telehealth: Payer: Self-pay

## 2023-06-08 NOTE — Progress Notes (Signed)
   06/08/2023  Patient ID: Brooke [name], female   DOB: April 23, 1946, 77 y.o.   MRN: [id number]  Patient outreach in response to clinic routed request yesterday  -[name] states she has continued to take Lomotil regularly for diarrhea but believes she may have been told to stop taking this medication.  She also believes it may be related to her dizziness. -Dr. Evelene Croon did not d/c Lomotil at 10/30 visit, only change directions and send a new order to the pharmacy -Patient states she has not had a dose since 6am this morning and believes dizziness has subsided some -I recommend she stop taking Lomotil until seen by Dr. Evelene Croon again on Thursday.  If dizziness has improved, I recommend remaining off of this medication.  If diarrhea is problematic without the medication, I recommend further workup for etiology to determine different treatment options. -Patient does not endorse improvement in dizziness with recently decreased gabapentin dose; but she also does not endorse adverse side effects, either -I reminded patient that several of her medications can cause dizziness, so it may take time and trial and error to identify cause/solution  [name], PharmD, DPLA

## 2023-06-08 NOTE — Telephone Encounter (Signed)
Summary: rx concern   The patient shares that they took their diphenoxylate-atropine (LOMOTIL) 2.5-0.025 MG tablet [[id number]]  The patient is concerned that they were instructed not to take the medication any further and have taken it by mistake  The patient shares that they are experiencing dizziness and would like to speak with a member of staff further  Please contact when available       Chief Complaint: "I think I'm supposed to stop the Lomotil." Symptoms: n/a Frequency: n/a Pertinent Negatives: Patient denies  Disposition: [] ED /[] Urgent Care (no appt availability in office) / [] Appointment(In office/virtual)/ []  [facility]/ [x] Home Care/ [] Refused Recommended Disposition /[] [facility]/ []  Follow-up with PCP Additional Notes: Verified in last OV note pt. Is to stop Lomotil. Verbalizes understanding.  Reason for Disposition  Caller has medicine question only, adult not sick, AND triager answers question  Answer Assessment - Initial Assessment Questions 1. NAME of MEDICINE: "What medicine(s) are you calling about?"     Lomotil 2. QUESTION: "What is your question?" (e.g., double dose of medicine, side effect)     "Am I supposed to stop the medicine?" 3. PRESCRIBER: "Who prescribed the medicine?" Reason: if prescribed by specialist, call should be referred to that group.     Dr. Evelene Croon 4. SYMPTOMS: "Do you have any symptoms?" If Yes, ask: "What symptoms are you having?"  "How bad are the symptoms (e.g., mild, moderate, severe)     N/a 5. PREGNANCY:  "Is there any chance that you are pregnant?" "When was your last menstrual period?"     No  Protocols used: Medication Question Call-A-AH

## 2023-06-08 NOTE — Telephone Encounter (Signed)
  Chief Complaint: Dizziness Symptoms: "dizzy-headed" Frequency: Since medication change on 06/02/2023 Pertinent Negatives: Patient denies  Disposition: [] ED /[] Urgent Care (no appt availability in office) / [x] Appointment(In office/virtual)/ []  [facility]/ [] Home Care/ [] Refused Recommended Disposition /[] [place] [facility]/ [x]  Follow-up with PCP Additional Notes: Pt states that since her medication was changed on 06/02/2023 she has been dizzy. She also states that now she has to wait 12 hours until she can take more medication, which is making her nervous. Pt has appt scheduled for thurs. Pt does not want to see any other providers. Pt is hoping that med can be changed sooner than upcoming appt. Please advise.  Reason for Disposition  [1] MODERATE dizziness (e.g., vertigo; feels very unsteady, interferes with normal activities) AND [2] has been evaluated by doctor (or NP/PA) for this  Answer Assessment - Initial Assessment Questions 1. DESCRIPTION: "Describe your dizziness."     Dizzy headed 2. VERTIGO: "Do you feel like either you or the room is spinning or tilting?"      yes 3. LIGHTHEADED: "Do you feel lightheaded?" (e.g., somewhat faint, woozy, weak upon standing)      4. SEVERITY: "How bad is it?"  "Can you walk?"   - MILD: Feels slightly dizzy and unsteady, but is walking normally.   - MODERATE: Feels unsteady when walking, but not falling; interferes with normal activities (e.g., school, work).   - SEVERE: Unable to walk without falling, or requires assistance to walk without falling.     moderate 5. ONSET:  "When did the dizziness begin?"     When medication was changed 7. CAUSE: "What do you think is causing the dizziness?"     Medication changed 8. RECURRENT SYMPTOM: "Have you had dizziness before?" If Yes, ask: "When was the last time?" "What happened that time?"     yes 9. OTHER SYMPTOMS: "Do you have any other symptoms?" (e.g., headache, weakness,  numbness, vomiting, earache)     no  Protocols used: Dizziness - Vertigo-A-AH

## 2023-06-10 ENCOUNTER — Ambulatory Visit: Payer: Medicare Other | Admitting: Pediatrics

## 2023-06-10 ENCOUNTER — Encounter: Payer: Self-pay | Admitting: Pediatrics

## 2023-06-15 ENCOUNTER — Ambulatory Visit (INDEPENDENT_AMBULATORY_CARE_PROVIDER_SITE_OTHER): Payer: Medicare Other | Admitting: Pediatrics

## 2023-06-15 ENCOUNTER — Encounter: Payer: Self-pay | Admitting: Pediatrics

## 2023-06-15 VITALS — BP 131/86 | HR 72 | Temp 98.7°F | Ht 59.0 in | Wt 118.6 lb

## 2023-06-15 DIAGNOSIS — R42 Dizziness and giddiness: Secondary | ICD-10-CM

## 2023-06-15 DIAGNOSIS — Z79899 Other long term (current) drug therapy: Secondary | ICD-10-CM

## 2023-06-15 NOTE — Progress Notes (Signed)
Office Visit  BP 131/86 (BP Location: Left Arm, Patient Position: Sitting, Cuff Size: Normal)   Pulse 72   Temp 98.7 F (37.1 C) (Oral)   Ht 4\' 11"  (1.499 m)   Wt 118 lb 9.6 oz (53.8 kg)   SpO2 93%   BMI 23.95 kg/m    Subjective:    Patient ID: Brooke [name], female    DOB: 1946-04-03, 77 y.o.   MRN: [id number]  HPI: SHIESHA MANCA is a 77 y.o. female  Chief Complaint  Patient presents with   Follow-up    1 week. Would like to make sure she's taking medication correctly, states that she's been feeling dizzy since her medication has been changed.    #Dizziness f/u She reports ongoing dizziness Still taking lomotil 4x a day, based on symptoms. Does not recall talking to pharmacist about stopping this medication. She is taking gabapentin every 12 hours, wants to go back to changes before She is unsure about timing of medications.  Relevant past medical, surgical, family and social history reviewed and updated as indicated. Interim medical history since our last visit reviewed. Allergies and medications reviewed and updated.  ROS per HPI unless specifically indicated above     Objective:    BP 131/86 (BP Location: Left Arm, Patient Position: Sitting, Cuff Size: Normal)   Pulse 72   Temp 98.7 F (37.1 C) (Oral)   Ht 4\' 11"  (1.499 m)   Wt 118 lb 9.6 oz (53.8 kg)   SpO2 93%   BMI 23.95 kg/m   Wt Readings from Last 3 Encounters:  06/15/23 118 lb 9.6 oz (53.8 kg)  06/02/23 118 lb (53.5 kg)  05/24/23 119 lb 6.4 oz (54.2 kg)     Physical Exam Constitutional:      Appearance: Normal appearance.  Pulmonary:     Effort: Pulmonary effort is normal.  Musculoskeletal:        General: Normal range of motion.  Skin:    Comments: Normal skin color  Neurological:     General: No focal deficit present.     Mental Status: She is alert. Mental status is at baseline.  Psychiatric:        Mood and Affect: Mood normal.        Behavior: Behavior normal.        Thought  Content: Thought content normal.         06/02/2023    1:17 PM 05/24/2023   12:00 PM 05/19/2023    1:13 PM 05/12/2023   11:32 AM  Depression screen PHQ 2/9  Decreased Interest 0 1 0 3  Down, Depressed, Hopeless 0 1 0 2  PHQ - 2 Score 0 2 0 5  Altered sleeping 0 0 0 0  Tired, decreased energy 0 1 0 3  Change in appetite 0 1 0 3  Feeling bad or failure about yourself  0 1 0 2  Trouble concentrating 0 0 0 2  Moving slowly or fidgety/restless 0 0 0 1  Suicidal thoughts 0 0 0 0  PHQ-9 Score 0 5 0 16  Difficult doing work/chores Not difficult at all Very difficult Not difficult at all Somewhat difficult       06/02/2023    1:17 PM 05/24/2023   12:00 PM 05/19/2023    1:14 PM 05/12/2023   11:32 AM  GAD 7 : Generalized Anxiety Score  Nervous, Anxious, on Edge 0 3 0 3  Control/stop worrying 0 3 0 3  Worry too  much - different things 0 3 0 3  Trouble relaxing 0 0 0 3  Restless 0 0 0 2  Easily annoyed or irritable 0 0 0 2  Afraid - awful might happen 0 0 0 0  Total GAD 7 Score 0 9 0 16  Anxiety Difficulty Not difficult at all Not difficult at all Not difficult at all Somewhat difficult       Assessment & Plan:  Assessment & Plan   Dizziness Polypharmacy Pt polypharmacy remains concern and I suspect what is driving her dizziness. She declines brain imaging at this time. She does not recall speaking with our clinical pharmacist earlier this week. I again emphasized importance of writing things down and following our recommendations. I voiced concern that she needs more support with medication management. Plan to have her take gabapentin in the morning and at lunch time. She will only take trazadone to sleep. Max lomotil x2 per day. Will have admin team call to schedule follow up.  Follow up plan: Return in about 2 days (around 06/17/2023), or if symptoms worsen or fail to improve.  Camara Renstrom Howell Pringle, MD

## 2023-06-15 NOTE — Patient Instructions (Signed)
Stop taking the diarrhea medicine, twice daily Gabapentin take 6a and at noon

## 2023-06-18 NOTE — Progress Notes (Signed)
Called and scheduled patient on 07/13/2023 @ 3:40 pm.

## 2023-06-24 ENCOUNTER — Ambulatory Visit: Payer: Self-pay

## 2023-06-24 ENCOUNTER — Encounter: Payer: Self-pay | Admitting: Pediatrics

## 2023-06-24 ENCOUNTER — Ambulatory Visit: Payer: Medicare Other | Admitting: Pediatrics

## 2023-06-24 VITALS — BP 127/80 | HR 74 | Ht 59.0 in | Wt 117.4 lb

## 2023-06-24 DIAGNOSIS — R110 Nausea: Secondary | ICD-10-CM | POA: Diagnosis not present

## 2023-06-24 DIAGNOSIS — K529 Noninfective gastroenteritis and colitis, unspecified: Secondary | ICD-10-CM

## 2023-06-24 DIAGNOSIS — K582 Mixed irritable bowel syndrome: Secondary | ICD-10-CM | POA: Diagnosis not present

## 2023-06-24 DIAGNOSIS — J392 Other diseases of pharynx: Secondary | ICD-10-CM | POA: Diagnosis not present

## 2023-06-24 MED ORDER — ONDANSETRON HCL 4 MG PO TABS: 4.0000 mg | ORAL_TABLET | Freq: Three times a day (TID) | ORAL | 0 refills | Status: AC | PRN

## 2023-06-24 NOTE — Telephone Encounter (Signed)
Chief Complaint: Diarrhea Symptoms: lightheaded, nausea, abdominal pain 5/10 Frequency: constant more than 8 episodes of Diarrhea in 24hrs  Pertinent Negatives: Patient denies vomiting, blood in stool, chest pain  Disposition: [] ED /[] Urgent Care (no appt availability in office) / [x] Appointment(In office/virtual)/ []  [facility]/ [] Home Care/ [] Refused Recommended Disposition /[] [place] [facility]/ []  Follow-up with PCP Additional Notes: Patient states she has had diarrhea for about 2 weeks now in the past 24 hours she has had more than 8 episodes of loose stools. Patient states she feels nauseous and lightheaded as well. Patient reports having a hx of diarrhea but the medication she takes for it has not been helpful this time. Patient reports abdominal pain 5/10 today. Care advice given and patient has been scheduled for an appointment with PCP today at 1620. Patient on the way to the office now.    Reason for Disposition  [1] SEVERE diarrhea (e.g., 7 or more times / day more than normal) AND [2] present > 24 hours (1 day)  Answer Assessment - Initial Assessment Questions 1. DIARRHEA SEVERITY: "How bad is the diarrhea?" "How many more stools have you had in the past 24 hours than normal?"    - NO DIARRHEA (SCALE 0)   - MILD (SCALE 1-3): Few loose or mushy BMs; increase of 1-3 stools over normal daily number of stools; mild increase in ostomy output.   -  MODERATE (SCALE 4-7): Increase of 4-6 stools daily over normal; moderate increase in ostomy output.   -  SEVERE (SCALE 8-10; OR "WORST POSSIBLE"): Increase of 7 or more stools daily over normal; moderate increase in ostomy output; incontinence.     8 or more  2. ONSET: "When did the diarrhea begin?"      2 weeks ago  3. BM CONSISTENCY: "How loose or watery is the diarrhea?"      Loose  4. VOMITING: "Are you also vomiting?" If Yes, ask: "How many times in the past 24 hours?"      No  5. ABDOMEN PAIN: "Are you having any  abdomen pain?" If Yes, ask: "What does it feel like?" (e.g., crampy, dull, intermittent, constant)      Yes  6. ABDOMEN PAIN SEVERITY: If present, ask: "How bad is the pain?"  (e.g., Scale 1-10; mild, moderate, or severe)   - MILD (1-3): doesn't interfere with normal activities, abdomen soft and not tender to touch    - MODERATE (4-7): interferes with normal activities or awakens from sleep, abdomen tender to touch    - SEVERE (8-10): excruciating pain, doubled over, unable to do any normal activities       5/10 7. ORAL INTAKE: If vomiting, "Have you been able to drink liquids?" "How much liquids have you had in the past 24 hours?"     No  8. HYDRATION: "Any signs of dehydration?" (e.g., dry mouth [not just dry lips], too weak to stand, dizziness, new weight loss) "When did you last urinate?"     No  9. EXPOSURE: "Have you traveled to a foreign country recently?" "Have you been exposed to anyone with diarrhea?" "Could you have eaten any food that was spoiled?"     No  10. ANTIBIOTIC USE: "Are you taking antibiotics now or have you taken antibiotics in the past 2 months?"       No  11. OTHER SYMPTOMS: "Do you have any other symptoms?" (e.g., fever, blood in stool)       lightheaded  Protocols used:  Diarrhea-A-AH

## 2023-06-24 NOTE — Patient Instructions (Signed)
For hydration: pedialyte to stay hydrated with increased BM

## 2023-06-24 NOTE — Progress Notes (Unsigned)
cbc  Office Visit  BP 127/80 (BP Location: Left Arm, Patient Position: Sitting, Cuff Size: Normal)   Pulse 74   Ht 4\' 11"  (1.499 m)   Wt 117 lb 6.4 oz (53.3 kg)   SpO2 98%   BMI 23.71 kg/m    Subjective:    Patient ID: Brooke [name], female    DOB: 08/18/1945, 77 y.o.   MRN: [id number]  HPI: Brooke [name] is a 77 y.o. female  Chief Complaint  Patient presents with  . Abdominal Pain    Patient says she has had this ongoing issue for the past weeks. Patient says it is about to drive her crazy. Patient says the pain is comes and goes. Patient says mostly when she eats, it runs right through her.   . Diarrhea  . Nausea   #Diarrhea Patient notes worsened over the last week. She is taking lamotil about 3-4x/ day She notes dizziness is better but diarrhea is worsening Has not scheduled follow up with her GI doctor Having new nausea over the last few days She feels some abdominal pain  #thrush Patient recently completed prolongued diflucan course She notes some improvement in throat dryness  Relevant past medical, surgical, family and social history reviewed and updated as indicated. Interim medical history since our last visit reviewed. Allergies and medications reviewed and updated.  ROS per HPI unless specifically indicated above     Objective:    BP 127/80 (BP Location: Left Arm, Patient Position: Sitting, Cuff Size: Normal)   Pulse 74   Ht 4\' 11"  (1.499 m)   Wt 117 lb 6.4 oz (53.3 kg)   SpO2 98%   BMI 23.71 kg/m   Wt Readings from Last 3 Encounters:  06/24/23 117 lb 6.4 oz (53.3 kg)  06/15/23 118 lb 9.6 oz (53.8 kg)  06/02/23 118 lb (53.5 kg)     Physical Exam Constitutional:      Appearance: Normal appearance.  HENT:     Head: Normocephalic and atraumatic.  Eyes:     Pupils: Pupils are equal, round, and reactive to light.  Cardiovascular:     Rate and Rhythm: Normal rate and regular rhythm.     Pulses: Normal pulses.     Heart sounds: Normal heart  sounds.  Pulmonary:     Effort: Pulmonary effort is normal.     Breath sounds: Normal breath sounds.  Abdominal:     General: Abdomen is flat.     Palpations: Abdomen is soft.  Musculoskeletal:        General: Normal range of motion.     Cervical back: Normal range of motion.  Skin:    General: Skin is warm and dry.     Capillary Refill: Capillary refill takes less than 2 seconds.  Neurological:     General: No focal deficit present.     Mental Status: She is alert. Mental status is at baseline.  Psychiatric:        Mood and Affect: Mood normal.        Behavior: Behavior normal.        06/02/2023    1:17 PM 05/24/2023   12:00 PM 05/19/2023    1:13 PM 05/12/2023   11:32 AM  Depression screen PHQ 2/9  Decreased Interest 0 1 0 3  Down, Depressed, Hopeless 0 1 0 2  PHQ - 2 Score 0 2 0 5  Altered sleeping 0 0 0 0  Tired, decreased energy 0 1 0 3  Change  in appetite 0 1 0 3  Feeling bad or failure about yourself  0 1 0 2  Trouble concentrating 0 0 0 2  Moving slowly or fidgety/restless 0 0 0 1  Suicidal thoughts 0 0 0 0  PHQ-9 Score 0 5 0 16  Difficult doing work/chores Not difficult at all Very difficult Not difficult at all Somewhat difficult       06/02/2023    1:17 PM 05/24/2023   12:00 PM 05/19/2023    1:14 PM 05/12/2023   11:32 AM  GAD 7 : Generalized Anxiety Score  Nervous, Anxious, on Edge 0 3 0 3  Control/stop worrying 0 3 0 3  Worry too much - different things 0 3 0 3  Trouble relaxing 0 0 0 3  Restless 0 0 0 2  Easily annoyed or irritable 0 0 0 2  Afraid - awful might happen 0 0 0 0  Total GAD 7 Score 0 9 0 16  Anxiety Difficulty Not difficult at all Not difficult at all Not difficult at all Somewhat difficult       Assessment & Plan:  Assessment & Plan   Irritable bowel syndrome with both constipation and diarrhea -     Ambulatory referral to Gastroenterology  Severe diarrhea -     CBC with Differential/Platelet -     Comprehensive metabolic  panel -     Lipase -     Clostridium difficile EIA -     H. pylori antigen, stool -     Ambulatory referral to Gastroenterology  Nausea -     Ondansetron HCl; Take 1 tablet (4 mg total) by mouth every 8 (eight) hours as needed for up to 7 days for nausea or vomiting.  Dispense: 20 tablet; Refill: 0 -     Ambulatory referral to Gastroenterology      Follow up plan: Return in about 4 weeks (around 07/22/2023) for Chronic illness f/u.  Brooke Goates Howell Pringle, MD

## 2023-06-25 LAB — COMPREHENSIVE METABOLIC PANEL
ALT: 9 [IU]/L (ref 0–32)
AST: 16 [IU]/L (ref 0–40)
Albumin: 3.9 g/dL (ref 3.8–4.8)
Alkaline Phosphatase: 69 [IU]/L (ref 44–121)
BUN/Creatinine Ratio: 10 — ABNORMAL LOW (ref 12–28)
BUN: 8 mg/dL (ref 8–27)
Bilirubin Total: 0.3 mg/dL (ref 0.0–1.2)
CO2: 26 mmol/L (ref 20–29)
Calcium: 8.7 mg/dL (ref 8.7–10.3)
Chloride: 96 mmol/L (ref 96–106)
Creatinine, Ser: 0.83 mg/dL (ref 0.57–1.00)
Globulin, Total: 2.4 g/dL (ref 1.5–4.5)
Glucose: 95 mg/dL (ref 70–99)
Potassium: 4.2 mmol/L (ref 3.5–5.2)
Sodium: 133 mmol/L — ABNORMAL LOW (ref 134–144)
Total Protein: 6.3 g/dL (ref 6.0–8.5)
eGFR: 73 mL/min/{1.73_m2} (ref >59)

## 2023-06-25 LAB — CBC WITH DIFFERENTIAL/PLATELET
Basophils Absolute: 0.1 10*3/uL (ref 0.0–0.2)
Basos: 1 %
EOS (ABSOLUTE): 0.1 10*3/uL (ref 0.0–0.4)
Eos: 1 %
Hematocrit: 36.4 % (ref 34.0–46.6)
Hemoglobin: 12.3 g/dL (ref 11.1–15.9)
Immature Grans (Abs): 0 10*3/uL (ref 0.0–0.1)
Immature Granulocytes: 0 %
Lymphocytes Absolute: 0.9 10*3/uL (ref 0.7–3.1)
Lymphs: 15 %
MCH: 30 pg (ref 26.6–33.0)
MCHC: 33.8 g/dL (ref 31.5–35.7)
MCV: 89 fL (ref 79–97)
Monocytes Absolute: 0.5 10*3/uL (ref 0.1–0.9)
Monocytes: 10 %
Neutrophils Absolute: 4.1 10*3/uL (ref 1.4–7.0)
Neutrophils: 73 %
Platelets: 266 10*3/uL (ref 150–450)
RBC: 4.1 x10E6/uL (ref 3.77–5.28)
RDW: 12.6 % (ref 11.7–15.4)
WBC: 5.6 10*3/uL (ref 3.4–10.8)

## 2023-06-25 LAB — LIPASE: Lipase: 40 U/L (ref 14–85)

## 2023-06-28 ENCOUNTER — Encounter: Payer: Self-pay | Admitting: Pediatrics

## 2023-07-07 ENCOUNTER — Ambulatory Visit: Payer: Self-pay | Admitting: *Deleted

## 2023-07-07 NOTE — Telephone Encounter (Signed)
  Chief Complaint: Daughter, [name] (On DPR) requesting to talk with Dr. Evelene Croon regarding her mother.   She is seeking advice.   Her mother cancelled her GI appt that was scheduled for 07/08/2023 due to having nausea.   "That's the reason she had a referral from Dr. Evelene Croon to see the GI dr".     Symptoms: continued nausea, not eating much.   Drinking Ginger Ale.   Mother is weak.   Frequency: "Dr. Evelene Croon has been dealing with this for a while now".   "I'm upset that my mother cancelled her GI appt".   "She did it before telling me about it".    Pertinent Negatives: Patient denies vomiting or diarrhea at this point but c/o nausea. Disposition: [] ED /[] Urgent Care (no appt availability in office) / [] Appointment(In office/virtual)/ []  [facility]/ [] Home Care/ [] Refused Recommended Disposition /[] [facility]/ [x]  Follow-up with PCP Additional Notes: Please call [name] at [phone].    If no answer please leave a message and [name] will call right back.   Sometimes she doesn't always have a good signal where she's at.

## 2023-07-07 NOTE — Telephone Encounter (Signed)
Message from [place] T sent at 07/07/2023  9:12 AM EST  Summary: nausea/weak   Daughter [name] called stated patient is nauseous, not eating and very weak. She is requesting a call back from the provider to see what can be done to help the symptoms. She has been drinking sprite.          Call History  Contact Date/Time Type Contact Phone/Fax User  07/07/2023 09:09 AM EST Phone (Incoming) [place] (Emergency Contact) [phone] Judie Petit) [place]on Jester   Reason for Disposition  Nausea is a chronic symptom (recurrent or ongoing AND present > 4 weeks)    Daughter, [name] [facility]) wants to talk with Dr. Evelene Croon directly about this situation with her mother.   Dr. Evelene Croon is aware of what's going on.  Answer Assessment - Initial Assessment Questions 1. NAUSEA SEVERITY: "How bad is the nausea?" (e.g., mild, moderate, severe; dehydration, weight loss)   - MILD: loss of appetite without change in eating habits   - MODERATE: decreased oral intake without significant weight loss, dehydration, or malnutrition   - SEVERE: inadequate caloric or fluid intake, significant weight loss, symptoms of dehydration     I returned the call to [name], daughter (on [place]).  My mother is still having stomach issues.   She cancelled her GI appt for tomorrow due to nausea.  This really got me upset with her.  She does not have a virus.   My mother says it's just a virus.   I let her know it's not a virus.  You've been seen for this and it's not a virus.    My mother is making things worse by cancelling her GI appt.    I told her that if she cancels the GI appt I'm going to call your doctor.   Then she told me she had already cancelled it.    I need some advice.   I don't know what to do.   I would like to talk with Dr. Evelene Croon when she has a minute.    2. ONSET: "When did the nausea begin?"     This has been an on going issue.   Dr. Evelene Croon has given her medicine for the nausea and did the GI referral  which my mother cancelled the appt. 3. VOMITING: "Any vomiting?" If Yes, ask: "How many times today?"     Not vomiting but c/o a lot of nausea.   I don't know if she is using the medicine Dr. Evelene Croon gave her for nausea or not.  My mother is being evasive with me. 4. RECURRENT SYMPTOM: "Have you had nausea before?" If Yes, ask: "When was the last time?" "What happened that time?"     Yes 5. CAUSE: "What do you think is causing the nausea?"     Not sure which is why she was scheduled to see the GI dr tomorrow 12/5. 6. PREGNANCY: "Is there any chance you are pregnant?" (e.g., unprotected intercourse, missed birth control pill, broken condom)     N/A due to age  Protocols used: Nausea-A-AH

## 2023-07-08 ENCOUNTER — Ambulatory Visit: Payer: Medicare Other | Admitting: Gastroenterology

## 2023-07-08 ENCOUNTER — Telehealth: Payer: Self-pay | Admitting: Pediatrics

## 2023-07-08 NOTE — Telephone Encounter (Signed)
Called patient's daughter per DPR, Dr. Evelene Croon is out of the office on Thurs, Friday, and Monday then she has an appointment with her on Tuesday, Dec 10.  Daughter stated that she needs to be seen however she stated that she would not want to see anyone other than provider as I offered her appointments for today with other providers.

## 2023-07-08 NOTE — Telephone Encounter (Signed)
Medication Refill -  Most Recent Primary Care Visit:  Provider: Jackolyn Confer  Department: [facility]  Visit Type: OFFICE VISIT  Date: 06/24/2023  Medication: Zofran  Has the patient contacted their pharmacy? No (Agent: If no, request that the patient contact the pharmacy for the refill. If patient does not wish to contact the pharmacy document the reason why and proceed with request.) Pt's daughter is requesting the medication, pt not currently taking   Is this the correct pharmacy for this prescription? Yes If no, delete pharmacy and type the correct one.  This is the patient's preferred pharmacy:  [id number] - [place],  - [address] [place] [address] Phone: [phone] Fax: [phone]   Has the prescription been filled recently? No  Is the patient out of the medication? Yes  Has the patient been seen for an appointment in the last year OR does the patient have an upcoming appointment? Yes  Can we respond through MyChart? No  Agent: Please be advised that Rx refills may take up to 3 business days. We ask that you follow-up with your pharmacy.

## 2023-07-11 ENCOUNTER — Other Ambulatory Visit: Payer: Self-pay | Admitting: Pediatrics

## 2023-07-11 DIAGNOSIS — F419 Anxiety disorder, unspecified: Secondary | ICD-10-CM

## 2023-07-11 DIAGNOSIS — G500 Trigeminal neuralgia: Secondary | ICD-10-CM

## 2023-07-13 ENCOUNTER — Ambulatory Visit: Payer: Medicare Other | Admitting: Pediatrics

## 2023-07-13 ENCOUNTER — Telehealth: Payer: Self-pay | Admitting: Pediatrics

## 2023-07-13 DIAGNOSIS — G500 Trigeminal neuralgia: Secondary | ICD-10-CM

## 2023-07-13 NOTE — Telephone Encounter (Signed)
Requested medication (s) are due for refill today: no  Requested medication (s) are on the active medication list: no  Last refill:  Fioricet: 04/13/23 #60        Fiornal 05/22/23  Future visit scheduled: yes  Notes to clinic:  both meds ended   Requested Prescriptions  Pending Prescriptions Disp Refills   butalbital-acetaminophen-caffeine (FIORICET) 50-325-40 MG tablet [Pharmacy Med Name: BUTALB-ACETAMIN-CAFF 50-325-40] 60 tablet     Sig: TAKE 2 TABLETS BY MOUTH 2 (TWO) TIMES DAILY AS NEEDED FOR HEADACHE.     Not Delegated - Analgesics:  Non-Opioid Analgesic Combinations 2 Failed - 07/11/2023  3:55 PM      Failed - This refill cannot be delegated      Passed - Cr in normal range and within 360 days    Creatinine, Ser  Date Value Ref Range Status  06/24/2023 0.83 0.57 - 1.00 mg/dL Final         Passed - eGFR is 10 or above and within 360 days    GFR calc Af Amer  Date Value Ref Range Status  05/12/2016 >60 >60 mL/min Final    Comment:    (NOTE) The eGFR has been calculated using the CKD EPI equation. This calculation has not been validated in all clinical situations. eGFR's persistently <60 mL/min signify possible Chronic Kidney Disease.    GFR calc non Af Amer  Date Value Ref Range Status  05/12/2016 >60 >60 mL/min Final   eGFR  Date Value Ref Range Status  06/24/2023 73 >59 mL/min/1.73 Final         Passed - Patient is not pregnant      Passed - Valid encounter within last 12 months    Recent Outpatient Visits           2 weeks ago Irritable bowel syndrome with both constipation and diarrhea   [place] [facility] Jackolyn Confer, MD   4 weeks ago Dizziness   [place] [facility] Jack[facility]yn Confer, MD   1 month ago Irritable bowel syndrome with both constipation and diarrhea   [place] [facility] Jackolyn Confer, MD   1 month ago Allergic reaction to drug, subsequent encounter   [place] [facility] Pearley, Sherran Needs, NP   1 month ago Murmur   [place] [facility] [place], Sherran Needs, NP       Future Appointments             In 1 week Evelene Croon, Atilano Median, MD [place] [facility]   In 3 weeks End, Cristal Deer, MD [place] [facility]             butalbital-aspirin-caffeine [facility]) [phone] MG capsule [Pharmacy Med Name: BUTAL-ASA-CAFF CAP] 60 capsule     Sig: TAKE 1 CAPSULE BY MOUTH 2 (TWO) TIMES DAILY AS NEEDED FOR HEADACHE OR MIGRAINE.     Not Delegated - Analgesics:  Non-Opioid Analgesic Combinations 2 Failed - 07/11/2023  3:55 PM      Failed - This refill cannot be delegated      Passed - Cr in normal range and within 360 days    Creatinine, Ser  Date Value Ref Range Status  06/24/2023 0.83 0.57 - 1.00 mg/dL Final         Passed - eGFR is 10 or above and within 360 days    GFR calc Af Amer  Date Value Ref Range Status  05/12/2016 >  60 >60 mL/min Final    Comment:    (NOTE) The eGFR has been calculated using the CKD EPI equation. This calculation has not been validated in all clinical situations. eGFR's persistently <60 mL/min signify possible Chronic Kidney Disease.    GFR calc non Af Amer  Date Value Ref Range Status  05/12/2016 >60 >60 mL/min Final   eGFR  Date Value Ref Range Status  06/24/2023 73 >59 mL/min/1.73 Final         Passed - Patient is not pregnant      Passed - Valid encounter within last 12 months    Recent Outpatient Visits           2 weeks ago Irritable bowel syndrome with both constipation and diarrhea   [place] [facility] Jackolyn Confer, MD   4 weeks ago Dizziness   [place] [facility] Jackolyn Confer, MD   1 month ago Irritable bowel syndrome with both constipation and diarrhea   [place] [facility] Jackolyn Confer, MD   1 month ago Allergic reaction to drug, subsequent encounter   [place] [facility] Pearley, Sherran Needs, NP   1 month ago Murmur   [place] [facility] [place], Sherran Needs, NP       Future Appointments             In 1 week Evelene Croon, Atilano Median, MD [place] [facility], [facility]   In 3 weeks End, Cristal Deer, MD [place] [facility] Prescriptions Disp Refills   busPIRone (BUSPAR) 5 MG tablet [Pharmacy Med Name: BUSPIRONE HCL 5 MG TABLET] 60 tablet     Sig: TAKE 1 TABLET BY MOUTH 2 TIMES DAILY AS NEEDED.     Psychiatry: Anxiolytics/Hypnotics - Non-controlled Passed - 07/11/2023  3:55 PM      Passed - Valid encounter within last 12 months    Recent Outpatient Visits           2 weeks ago Irritable bowel syndrome with both constipation and diarrhea   [place] [facility] Jackolyn Confer, MD   4 weeks ago Dizziness   [place] [facility] Jackolyn Confer, MD   1 month ago Irritable bowel syndrome with both constipation and diarrhea   [place] [facility] Jackolyn Confer, MD   1 month ago Allergic reaction to drug, subsequent encounter   [place] [facility], Sherran Needs, NP   1 month ago Murmur    [place] [facility] [place], Sherran Needs, NP       Future Appointments             In 1 week Evelene Croon, Atilano Median, MD  [facility], [facility]   In 3 weeks End, Cristal Deer, MD [facility]

## 2023-07-13 NOTE — Telephone Encounter (Signed)
Requested Prescriptions  Pending Prescriptions Disp Refills   butalbital-acetaminophen-caffeine (FIORICET) 50-325-40 MG tablet [Pharmacy Med Name: BUTALB-ACETAMIN-CAFF 50-325-40] 60 tablet     Sig: TAKE 2 TABLETS BY MOUTH 2 (TWO) TIMES DAILY AS NEEDED FOR HEADACHE.     Not Delegated - Analgesics:  Non-Opioid Analgesic Combinations 2 Failed - 07/11/2023  3:55 PM      Failed - This refill cannot be delegated      Passed - Cr in normal range and within 360 days    Creatinine, Ser  Date Value Ref Range Status  06/24/2023 0.83 0.57 - 1.00 mg/dL Final         Passed - eGFR is 10 or above and within 360 days    GFR calc Af Amer  Date Value Ref Range Status  05/12/2016 >60 >60 mL/min Final    Comment:    (NOTE) The eGFR has been calculated using the CKD EPI equation. This calculation has not been validated in all clinical situations. eGFR's persistently <60 mL/min signify possible Chronic Kidney Disease.    GFR calc non Af Amer  Date Value Ref Range Status  05/12/2016 >60 >60 mL/min Final   eGFR  Date Value Ref Range Status  06/24/2023 73 >59 mL/min/1.73 Final         Passed - Patient is not pregnant      Passed - Valid encounter within last 12 months    Recent Outpatient Visits           2 weeks ago Irritable bowel syndrome with both constipation and diarrhea   [place] [facility] Jackolyn Confer, MD   4 weeks ago Dizziness   [place] [facility] Jackolyn Confer, MD   1 month ago Irritable bowel syndrome with both constipation and diarrhea   [place] [facility] Jack[facility]yn Confer, MD   1 month ago Allergic reaction to drug, subsequent encounter   [place] [facility] [place], Sherran Needs, NP   1 month ago Murmur   [place] [facility] [place], Sherran Needs, NP       Future Appointments             In 1 week Evelene Croon, Atilano Median, MD [place] [facility]   In 3  weeks End, Cristal Deer, MD [facility]             butalbital-aspirin-caffeine [facility]) [phone] MG capsule [Pharmacy Med Name: BUTAL-ASA-CAFF CAP] 60 capsule     Sig: TAKE 1 CAPSULE BY MOUTH 2 (TWO) TIMES DAILY AS NEEDED FOR HEADACHE OR MIGRAINE.     Not Delegated - Analgesics:  Non-Opioid Analgesic Combinations 2 Failed - 07/11/2023  3:55 PM      Failed - This refill cannot be delegated      Passed - Cr in normal range and within 360 days    Creatinine, Ser  Date Value Ref Range Status  06/24/2023 0.83 0.57 - 1.00 mg/dL Final         Passed - eGFR is 10 or above and within 360 days    GFR calc Af Amer  Date Value Ref Range Status  05/12/2016 >60 >60 mL/min Final    Comment:    (NOTE) The eGFR has been calculated using the CKD EPI equation. This calculation has not been validated in all clinical situations. eGFR's persistently <60 mL/min signify possible Chronic Kidney Disease.    GFR calc non Af Denyse Dago  Date  Value Ref Range Status  05/12/2016 >60 >60 mL/min Final   eGFR  Date Value Ref Range Status  06/24/2023 73 >59 mL/min/1.73 Final         Passed - Patient is not pregnant      Passed - Valid encounter within last 12 months    Recent Outpatient Visits           2 weeks ago Irritable bowel syndrome with both constipation and diarrhea   [place] [facility] Jackolyn Confer, MD   4 weeks ago Dizziness   [place] [facility] Confer, MD   1 month ago Irritable bowel syndrome with both constipation and diarrhea   [place] [facility] Jackolyn Confer, MD   1 month ago Allergic reaction to drug, subsequent encounter   [place] [facility], Sherran Needs, NP   1 month ago Murmur   [place] [facility] [place], Sherran Needs, NP       Future Appointments             In 1 week Evelene Croon, Atilano Median, MD [place] [facility]    In 3 weeks End, Cristal Deer, MD [place] [facility]             busPIRone (BUSPAR) 5 MG tablet [Pharmacy Med Name: BUSPIRONE HCL 5 MG TABLET] 60 tablet     Sig: TAKE 1 TABLET BY MOUTH 2 TIMES DAILY AS NEEDED.     Psychiatry: Anxiolytics/Hypnotics - Non-controlled Passed - 07/11/2023  3:55 PM      Passed - Valid encounter within last 12 months    Recent Outpatient Visits           2 weeks ago Irritable bowel syndrome with both constipation and diarrhea   [place] [facility] Jack[facility]yn Confer, MD   4 weeks ago Dizziness   [place] [facility] Jackolyn Confer, MD   1 month ago Irritable bowel syndrome with both constipation and diarrhea   [place] [facility] Confer, MD   1 month ago Allergic reaction to drug, subsequent encounter   [facility], Sherran Needs, NP   1 month ago Murmur   [place] [facility] [place], Sherran Needs, NP       Future Appointments             In 1 week Evelene Croon, Atilano Median, MD  [facility]   In 3 weeks End, Cristal Deer, MD [facility]

## 2023-07-13 NOTE — Telephone Encounter (Signed)
Medication Refill -  Most Recent Primary Care Visit:  Provider: Jackolyn Confer  Department: [facility]  Visit Type: OFFICE VISIT  Date: 06/24/2023  Medication:  Butal asa caffeine capsules (Patient states this is her headache medication)   *Patient is getting low   Has the patient contacted their pharmacy?  Yes, advised to contact PCP  Is this the correct pharmacy for this prescription? Yes, the one listed below  This is the patient's preferred pharmacy:  [id number] - [place], [place] - [address] Phone: [phone] Fax: [phone]   Has the prescription been filled recently? Medication is showing on my discontinued side?  Is the patient out of the medication? Patient is getting low  Has the patient been seen for an appointment in the last year OR does the patient have an upcoming appointment? Patient has a F/U appt scheduled for 12.19.24

## 2023-07-13 NOTE — Telephone Encounter (Signed)
Requested yesterday by the pharmacy in a separate refill encounter, routed to the provider for approval today.

## 2023-07-14 ENCOUNTER — Telehealth: Payer: Self-pay | Admitting: Pediatrics

## 2023-07-14 NOTE — Telephone Encounter (Signed)
Patient called stated the pharmacy told her they need the providers approval. Please f/u with pharmacy.

## 2023-07-14 NOTE — Telephone Encounter (Signed)
Medication sent to pharmacy today in a separate refill encounter.

## 2023-07-14 NOTE — Telephone Encounter (Signed)
Medication Refill -  Most Recent Primary Care Visit:  Provider: Jackolyn Confer  Department: [facility]  Visit Type: OFFICE VISIT  Date: 06/24/2023  Medication: butalbital-acetaminophen-caffeine (FIORICET) 50-325-40 MG tablet [[id number]]  Has the patient contacted their pharmacy? Yes (Agent: If no, request that the patient contact the pharmacy for the refill. If patient does not wish to contact the pharmacy document the reason why and proceed with request.) (Agent: If yes, when and what did the pharmacy advise?)  Is this the correct pharmacy for this prescription? Yes If no, delete pharmacy and type the correct one.  This is the patient's preferred pharmacy:  [id number] - [place], [place] - [address] [place] [address] Phone: [phone] Fax: [phone]   Has the prescription been filled recently? Yes  Is the patient out of the medication? No   1 pill left  Has the patient been seen for an appointment in the last year OR does the patient have an upcoming appointment? Yes  Can we respond through MyChart? No  Agent: Please be advised that Rx refills may take up to 3 business days. We ask that you follow-up with your pharmacy.

## 2023-07-19 ENCOUNTER — Ambulatory Visit: Payer: Medicare Other | Admitting: Gastroenterology

## 2023-07-22 ENCOUNTER — Ambulatory Visit (INDEPENDENT_AMBULATORY_CARE_PROVIDER_SITE_OTHER): Payer: Medicare Other | Admitting: Pediatrics

## 2023-07-22 ENCOUNTER — Encounter: Payer: Self-pay | Admitting: Pediatrics

## 2023-07-22 VITALS — BP 134/85 | HR 70 | Temp 98.6°F | Resp 15 | Wt 116.8 lb

## 2023-07-22 DIAGNOSIS — R110 Nausea: Secondary | ICD-10-CM | POA: Diagnosis not present

## 2023-07-22 DIAGNOSIS — A084 Viral intestinal infection, unspecified: Secondary | ICD-10-CM

## 2023-07-22 MED ORDER — ONDANSETRON HCL 4 MG PO TABS: 4.0000 mg | ORAL_TABLET | Freq: Three times a day (TID) | ORAL | 0 refills | Status: DC | PRN

## 2023-07-22 NOTE — Progress Notes (Signed)
Office Visit  BP 134/85 (BP Location: Left Arm, Patient Position: Sitting, Cuff Size: Normal)   Pulse 70   Temp 98.6 F (37 C) (Oral)   Resp 15   Wt 116 lb 12.8 oz (53 kg)   SpO2 98%   BMI 23.59 kg/m    Subjective:    Patient ID: Brooke [name], female    DOB: 1946/03/06, 77 y.o.   MRN: [id number]  HPI: Brooke [name] is a 77 y.o. female  Chief Complaint  Patient presents with   nausea    Ongoing for 2-3 weeks. Not vomiting. Eating and drinking as normal    Follow-up    Constipation has resolved since last visit    Discussed the use of AI scribe software for clinical note transcription with the patient, who gave verbal consent to proceed.  History of Present Illness   The patient, with a history of gastrointestinal issues, reports a recent episode of constipation, which was initially mistaken for diarrhea. The patient's partner observed small, hard stools, indicative of constipation. The patient took several doses of Verelan, which seemed to alleviate the constipation.  However, the patient is currently experiencing significant nausea. She previously received Zofran, which was effective in managing this symptom. The patient has also been taking over-the-counter Dramamine for nausea, but this is not recommended for long-term use due to potential interactions with her other medications.  The patient had a scheduled visit with a GI specialist, but this was cancelled due to unspecified circumstances. The patient has not yet rescheduled this appointment, which is considered important for managing her ongoing gastrointestinal issues.  The patient's mood has been affected by recent personal losses, including the death of her second son in 2022-11-10. She is unsure if her current medications are effectively managing her mood. The patient's partner has noticed some memory issues, such as forgetting the reason for getting up. The patient has been on Klonopin, a headache medication, and  Lomotil for a long time, all of which can potentially cause memory issues.  The patient has a murmur that was detected in a previous consultation. She has a scheduled appointment with her cardiologist in January to further investigate this. The patient and her partner have had a challenging year due to personal losses, which may have contributed to missed appointments.      Relevant past medical, surgical, family and social history reviewed and updated as indicated. Interim medical history since our last visit reviewed. Allergies and medications reviewed and updated.  ROS per HPI unless specifically indicated above     Objective:    BP 134/85 (BP Location: Left Arm, Patient Position: Sitting, Cuff Size: Normal)   Pulse 70   Temp 98.6 F (37 C) (Oral)   Resp 15   Wt 116 lb 12.8 oz (53 kg)   SpO2 98%   BMI 23.59 kg/m   Wt Readings from Last 3 Encounters:  07/22/23 116 lb 12.8 oz (53 kg)  06/24/23 117 lb 6.4 oz (53.3 kg)  06/15/23 118 lb 9.6 oz (53.8 kg)     Physical Exam Constitutional:      Appearance: Normal appearance.  HENT:     Head: Normocephalic and atraumatic.  Eyes:     Pupils: Pupils are equal, round, and reactive to light.  Cardiovascular:     Rate and Rhythm: Normal rate and regular rhythm.     Pulses: Normal pulses.     Heart sounds: Murmur heard.     Comments: Holosystolic murmur present  Abdominal:     General: Abdomen is flat.     Palpations: Abdomen is soft.  Musculoskeletal:        General: Normal range of motion.     Cervical back: Normal range of motion.  Skin:    General: Skin is warm and dry.     Capillary Refill: Capillary refill takes less than 2 seconds.  Neurological:     General: No focal deficit present.     Mental Status: She is alert. Mental status is at baseline.  Psychiatric:        Mood and Affect: Mood normal.        Behavior: Behavior normal.         06/02/2023    1:17 PM 05/24/2023   12:00 PM 05/19/2023    1:13 PM  05/12/2023   11:32 AM  Depression screen PHQ 2/9  Decreased Interest 0 1 0 3  Down, Depressed, Hopeless 0 1 0 2  PHQ - 2 Score 0 2 0 5  Altered sleeping 0 0 0 0  Tired, decreased energy 0 1 0 3  Change in appetite 0 1 0 3  Feeling bad or failure about yourself  0 1 0 2  Trouble concentrating 0 0 0 2  Moving slowly or fidgety/restless 0 0 0 1  Suicidal thoughts 0 0 0 0  PHQ-9 Score 0 5 0 16  Difficult doing work/chores Not difficult at all Very difficult Not difficult at all Somewhat difficult       06/02/2023    1:17 PM 05/24/2023   12:00 PM 05/19/2023    1:14 PM 05/12/2023   11:32 AM  GAD 7 : Generalized Anxiety Score  Nervous, Anxious, on Edge 0 3 0 3  Control/stop worrying 0 3 0 3  Worry too much - different things 0 3 0 3  Trouble relaxing 0 0 0 3  Restless 0 0 0 2  Easily annoyed or irritable 0 0 0 2  Afraid - awful might happen 0 0 0 0  Total GAD 7 Score 0 9 0 16  Anxiety Difficulty Not difficult at all Not difficult at all Not difficult at all Somewhat difficult       Assessment & Plan:  Assessment & Plan   Viral gastroenteritis Nausea Recent history of alternating diarrhea and constipation, currently resolved. Current complaint of nausea. Patient has been using over-the-counter Dramamine for nausea. Unclear if truly viral gastro vs IBS manifestation. Emphasized importance of GI evaluation with persistent unclear symptoms. -Prescribe short course of Zofran for nausea. -Advise against long-term use of Dramamine due to potential interactions with current medications and risk of dizziness. -Reschedule referral to GI specialist to further evaluate and manage alternating diarrhea and constipation. -     Ondansetron HCl; Take 1 tablet (4 mg total) by mouth every 8 (eight) hours as needed for nausea or vomiting.  Dispense: 20 tablet; Refill: 0  Mood and Memory Patient reports feeling down due to recent personal losses. Some concerns about memory issues raised. Declines  adjustments or memory testing today, open to evaluation next visit. -Continue current mood medications. -Consider memory testing at next follow-up visit.   Follow up plan: Return in about 3 months (around 10/20/2023) for Chronic illness f/u.  Azelyn Batie Howell Pringle, MD

## 2023-08-09 ENCOUNTER — Ambulatory Visit: Payer: Medicare Other | Admitting: Internal Medicine

## 2023-08-09 NOTE — Progress Notes (Deleted)
  Cardiology Office Note:  .   Date:  08/09/2023  ID:  Brooke [name], DOB 1946-07-14, MRN [id number] PCP: Herold Hadassah SQUIBB, MD  [facility] Providers Cardiologist:  None { Click to update primary MD,subspecialty MD or APP then REFRESH:1}    History of Present Illness: .   Brooke [name] is a 78 y.o. female with history of asthma and hypertension, presenting for evaluation of heart murmur.  Her husband is also a patient of mine.  ROS: See HPI  Studies Reviewed: .        *** Risk Assessment/Calculations:   {Does this patient have ATRIAL FIBRILLATION?:[id number]} No BP recorded.  {Refresh Note OR Click here to enter BP  :1}***       Physical Exam:   VS:  There were no vitals taken for this visit.   Wt Readings from Last 3 Encounters:  07/22/23 116 lb 12.8 oz (53 kg)  06/24/23 117 lb 6.4 oz (53.3 kg)  06/15/23 118 lb 9.6 oz (53.8 kg)    General:  NAD. Neck: No JVD or HJR. Lungs: Clear to auscultation bilaterally without wheezes or crackles. Heart: Regular rate and rhythm without murmurs, rubs, or gallops. Abdomen: Soft, nontender, nondistended. Extremities: No lower extremity edema.  ASSESSMENT AND PLAN: .    ***    {Are you ordering a CV Procedure (e.g. stress test, cath, DCCV, TEE, etc)?   Press F2        :[id number]}  Dispo: ***  Signed, Lonni Hanson, MD

## 2023-08-21 ENCOUNTER — Other Ambulatory Visit: Payer: Self-pay | Admitting: Pediatrics

## 2023-08-21 DIAGNOSIS — G500 Trigeminal neuralgia: Secondary | ICD-10-CM

## 2023-08-21 DIAGNOSIS — F419 Anxiety disorder, unspecified: Secondary | ICD-10-CM

## 2023-08-23 NOTE — Telephone Encounter (Signed)
Requested medication (s) are due for refill today: Buspar D/See request.C 05/19/23.  Requested medication (s) are on the active medication list: Fioricet  yes  Last refill:    Future visit scheduled: Yes  Notes to clinic:  See request.    Requested Prescriptions  Pending Prescriptions Disp Refills   busPIRone (BUSPAR) 5 MG tablet [Pharmacy Med Name: BUSPIRONE HCL 5 MG TABLET] 60 tablet 0    Sig: TAKE 1 TABLET BY MOUTH 2 TIMES DAILY AS NEEDED.     Psychiatry: Anxiolytics/Hypnotics - Non-controlled Passed - 08/23/2023 11:50 AM      Passed - Valid encounter within last 12 months    Recent Outpatient Visits           1 month ago Viral gastroenteritis   [place] [facility] Jackolyn Confer, MD   2 months ago Irritable bowel syndrome with both constipation and diarrhea   [place] [facility], [facility]   2 months ago Dizziness   [place] [facility] Jack[facility]yn Confer, MD   2 months ago Irritable bowel syndrome with both constipation and diarrhea   [place] [facility], MD   3 months ago Allergic reaction to drug, subsequent encounter   [place] [facility], Sherran Needs, NP       Future Appointments             In 1 month Gollan, Tollie Pizza, MD [facility] [facility] [place]   In 1 month Evelene Croon, Atilano Median, MD [place] [facility]             butalbital-acetaminophen-caffeine (FIORICET) [phone] MG tablet [Pharmacy Med Name: BUTALB-ACETAMIN-CAFF 50-325-40] 60 tablet     Sig: Take 2 tablets by mouth 2 (two) times daily as needed for headache.     Not Delegated - Analgesics:  Non-Opioid Analgesic Combinations 2 Failed - 08/23/2023 11:50 AM      Failed - This refill cannot be delegated      Passed - Cr in normal range and within 360 days    Creatinine, Ser  Date Value Ref Range Status  06/24/2023 0.83 0.57 - 1.00 mg/dL Final          Passed - eGFR is 10 or above and within 360 days    GFR calc Af Amer  Date Value Ref Range Status  05/12/2016 >60 >60 mL/min Final    Comment:    (NOTE) The eGFR has been calculated using the CKD EPI equation. This calculation has not been validated in all clinical situations. eGFR's persistently <60 mL/min signify possible Chronic Kidney Disease.    GFR calc non Af Amer  Date Value Ref Range Status  05/12/2016 >60 >60 mL/min Final   eGFR  Date Value Ref Range Status  06/24/2023 73 >59 mL/min/1.73 Final         Passed - Patient is not pregnant      Passed - Valid encounter within last 12 months    Recent Outpatient Visits           1 month ago Viral gastroenteritis   [place] [facility], [facility]   2 months ago Irritable bowel syndrome with both constipation and diarrhea   [place] [facility] Jack[facility]yn Confer, MD   2 months ago Dizziness    [facility]   2 months ago Irritable bowel  syndrome with both constipation and diarrhea   [place] [facility]yn Confer, MD   3 months ago Allergic reaction to drug, subsequent encounter   [place] [facility], Sherran Needs, NP       Future Appointments             In 1 month Gollan, Tollie Pizza, MD [place] [facility] [place]   In 1 month Evelene Croon, Atilano Median, MD [facility]

## 2023-08-24 ENCOUNTER — Other Ambulatory Visit: Payer: Self-pay | Admitting: Pediatrics

## 2023-08-24 DIAGNOSIS — A084 Viral intestinal infection, unspecified: Secondary | ICD-10-CM

## 2023-08-24 DIAGNOSIS — G500 Trigeminal neuralgia: Secondary | ICD-10-CM

## 2023-08-24 DIAGNOSIS — F419 Anxiety disorder, unspecified: Secondary | ICD-10-CM

## 2023-08-24 DIAGNOSIS — R110 Nausea: Secondary | ICD-10-CM

## 2023-08-24 DIAGNOSIS — K582 Mixed irritable bowel syndrome: Secondary | ICD-10-CM

## 2023-08-24 DIAGNOSIS — F325 Major depressive disorder, single episode, in full remission: Secondary | ICD-10-CM

## 2023-08-24 MED ORDER — LOPERAMIDE HCL 2 MG PO TABS: 2.0000 mg | ORAL_TABLET | Freq: Four times a day (QID) | ORAL | 0 refills | Status: DC | PRN

## 2023-08-24 NOTE — Telephone Encounter (Signed)
Requested medications are due for refill today.  Some are - most are historical  Requested medications are on the active medications list.  yes  Last refill. Varied - most historical  Future visit scheduled.   yes  Notes to clinic.  Please review for refill.    Requested Prescriptions  Pending Prescriptions Disp Refills   butalbital-aspirin-caffeine (FIORINAL) 50-325-40 MG capsule 60 capsule 1    Sig: Take 1 capsule by mouth 2 (two) times daily as needed for headache or migraine.     Not Delegated - Analgesics:  Non-Opioid Analgesic Combinations 2 Failed - 08/24/2023  2:49 PM      Failed - This refill cannot be delegated      Passed - Cr in normal range and within 360 days    Creatinine, Ser  Date Value Ref Range Status  06/24/2023 0.83 0.57 - 1.00 mg/dL Final         Passed - eGFR is 10 or above and within 360 days    GFR calc Af Amer  Date Value Ref Range Status  05/12/2016 >60 >60 mL/min Final    Comment:    (NOTE) The eGFR has been calculated using the CKD EPI equation. This calculation has not been validated in all clinical situations. eGFR's persistently <60 mL/min signify possible Chronic Kidney Disease.    GFR calc non Af Amer  Date Value Ref Range Status  05/12/2016 >60 >60 mL/min Final   eGFR  Date Value Ref Range Status  06/24/2023 73 >59 mL/min/1.73 Final         Passed - Patient is not pregnant      Passed - Valid encounter within last 12 months    Recent Outpatient Visits           1 month ago Viral gastroenteritis   [place] [facility], [facility]   2 months ago Irritable bowel syndrome with both constipation and diarrhea   [place] [facility], [facility]   2 months ago Dizziness   [place] [facility], MD   2 months ago Irritable bowel syndrome with both constipation and diarrhea   [place] [facility], MD   3 months ago  Allergic reaction to drug, subsequent encounter   [place] [facility] Sher[facility] Needs, NP       Future Appointments             In 1 month Gollan, Tollie Pizza, MD [place] [facility] [place]   In 1 month Evelene Croon, Atilano Median, MD [facility] [place] [facility]             clonazePAM (KLONOPIN) 0.5 MG tablet 60 tablet 3    Sig: Take 1 tablet (0.5 mg total) by mouth 2 (two) times daily as needed for anxiety.     Not Delegated - Psychiatry: Anxiolytics/Hypnotics 2 Failed - 08/24/2023  2:49 PM      Failed - This refill cannot be delegated      Failed - Urine Drug Screen completed in last 360 days      Passed - Patient is not pregnant      Passed - Valid encounter within last 6 months    Recent Outpatient Visits           1 month ago Viral gastroenteritis   [place] [facility] Jack[facility]yn Confer, MD   2 months ago  Irritable bowel syndrome with both constipation and diarrhea   [place] [facility] Jack[facility]yn Confer, MD   2 months ago Dizziness   [place] [facility], MD   2 months ago Irritable bowel syndrome with both constipation and diarrhea   [place] [facility] Confer, MD   3 months ago Allergic reaction to drug, subsequent encounter   [place] [facility], Sherran Needs, NP       Future Appointments             In 1 month Gollan, Tollie Pizza, MD [place] [facility] [place]   In 1 month [name], MD [place] [facility]             traZODone (DESYREL) 100 MG tablet  3    Sig: Take 1 tablet (100 mg total) by mouth at bedtime.     Psychiatry: Antidepressants - Serotonin Modulator Passed - 08/24/2023  2:49 PM      Passed - Completed PHQ-2 or PHQ-9 in the last 360 days      Passed - Valid encounter within last 6 months    Recent Outpatient Visits           1 month ago Viral  gastroenteritis   [place] [facility]yn Confer, MD   2 months ago Irritable bowel syndrome with both constipation and diarrhea   [place] [facility]yn Confer, MD   2 months ago Dizziness   [place] [facility] Jack[facility]yn Confer, MD   2 months ago Irritable bowel syndrome with both constipation and diarrhea   [place] [facility] Confer, MD   3 months ago Allergic reaction to drug, subsequent encounter   [place] [facility], Sherran Needs, NP       Future Appointments             In 1 month Gollan, Tollie Pizza, MD [place] [facility] [place]   In 1 month Evelene Croon, Atilano Median, MD [place] [facility]             ondansetron (ZOFRAN) 4 MG tablet 20 tablet 0    Sig: Take 1 tablet (4 mg total) by mouth every 8 (eight) hours as needed for nausea or vomiting.     Not Delegated - Gastroenterology: Antiemetics - ondansetron Failed - 08/24/2023  2:49 PM      Failed - This refill cannot be delegated      Passed - AST in normal range and within 360 days    AST  Date Value Ref Range Status  06/24/2023 16 0 - 40 IU/L Final         Passed - ALT in normal range and within 360 days    ALT  Date Value Ref Range Status  06/24/2023 9 0 - 32 IU/L Final         Passed - Valid encounter within last 6 months    Recent Outpatient Visits           1 month ago Viral gastroenteritis   [place] [facility] Confer, MD   2 months ago Irritable bowel syndrome with both constipation and diarrhea   [place] [facility] Jack[facility] Confer, MD   2 months ago Dizziness    [facility] [place] Nunnery  P, MD   2 months ago Irritable bowel syndrome with both constipation and diarrhea   [place] [facility] Jackolyn Confer, MD   3 months ago Allergic reaction to drug, subsequent encounter   [facility], Sherran Needs, NP       Future Appointments             In 1 month Gollan, Tollie Pizza, MD [place] [facility] [place]   In 1 month Jackolyn Confer, MD [place] [facility]             omeprazole (PRILOSEC) 20 MG capsule 30 capsule     Sig: Take 1 capsule (20 mg total) by mouth daily.     Gastroenterology: Proton Pump Inhibitors Passed - 08/24/2023  2:49 PM      Passed - Valid encounter within last 12 months    Recent Outpatient Visits           1 month ago Viral gastroenteritis   [place] [facility] Jack[facility]yn Confer, MD   2 months ago Irritable bowel syndrome with both constipation and diarrhea   [place] [facility] Jack[facility] Confer, MD   2 months ago Dizziness   [place] [facility] Confer, MD   2 months ago Irritable bowel syndrome with both constipation and diarrhea   [place] [facility] Jack[facility] Confer, MD   3 months ago Allergic reaction to drug, subsequent encounter   [place] [facility], Sher[facility] Needs, NP       Future Appointments             In 1 month Gollan, Tollie Pizza, MD [place] [facility] [place]   In 1 month Jackolyn Confer, MD [place] [facility]             losartan (COZAAR) 25 MG tablet      Sig: Take 2 tablets (50 mg total) by mouth daily.     Cardiovascular:  Angiotensin Receptor Blockers Passed - 08/24/2023  2:49 PM      Passed - Cr in normal range and within 180 days    Creatinine, Ser  Date Value Ref Range Status  06/24/2023 0.83 0.57 - 1.00 mg/dL Final         Passed - K in normal range and within 180 days    Potassium  Date Value Ref Range Status  06/24/2023 4.2 3.5 - 5.2 mmol/L Final         Passed - Patient is not pregnant      Passed - Last BP in normal range    BP Readings from Last 1 Encounters:  07/22/23 134/85          Passed - Valid encounter within last 6 months    Recent Outpatient Visits           1 month ago Viral gastroenteritis   [place] [facility] Confer, MD   2 months ago Irritable bowel syndrome with both constipation and diarrhea   [place] [facility]yn Confer, MD   2 months ago Dizziness   [place] [facility] Jack[facility]yn Confer, MD   2 months ago Irritable bowel syndrome with both constipation and diarrhea    [facility] Jack[facility] Confer, MD   3 months ago Allergic reaction to drug,  subsequent encounter   [place] [facility] [place], Sherran Needs, NP       Future Appointments             In 1 month Gollan, Tollie Pizza, MD [place] [facility] [place]   In 1 month [name], MD [place] [facility]             albuterol (VENTOLIN HFA) 108 [phone] Base) MCG/ACT inhaler      Sig: Inhale 2 puffs into the lungs every 6 (six) hours as needed for wheezing or shortness of breath.     Pulmonology:  Beta Agonists 2 Passed - 08/24/2023  2:49 PM      Passed - Last BP in normal range    BP Readings from Last 1 Encounters:  07/22/23 134/85         Passed - Last Heart Rate in normal range    Pulse Readings from Last 1 Encounters:  07/22/23 70         Passed - Valid encounter within last 12 months    Recent Outpatient Visits           1 month ago Viral gastroenteritis   [place] [facility] Jack[facility]yn Confer, MD   2 months ago Irritable bowel syndrome with both constipation and diarrhea   [place] [facility] Jack[facility] Confer, MD   2 months ago Dizziness   [place] [facility] Jackolyn Confer, MD   2 months ago Irritable bowel syndrome with both constipation and diarrhea   [place] [facility] Jack[facility] Confer, MD   3 months ago Allergic reaction to drug, subsequent encounter   [name], Sherran Needs, NP       Future Appointments             In 1 month Gollan, Tollie Pizza, MD [place] [facility] [place]   In 1 month Jackolyn Confer, MD [place] [facility]             ipratropium (ATROVENT) 0.06 % nasal spray 15 mL     Sig: Place into both nostrils.     Off-Protocol Failed - 08/24/2023  2:49 PM      Failed - Medication not assigned to a protocol, review manually.      Passed - Valid encounter within last 12 months    Recent Outpatient Visits           1 month ago Viral gastroenteritis   [place] [facility] Con[facility], MD   2 months ago Irritable bowel syndrome with both constipation and diarrhea   [place] [facility] Jack[facility] Confer, MD   2 months ago Dizziness   [place] [facility] Jack[facility]yn Confer, MD   2 months ago Irritable bowel syndrome with both constipation and diarrhea   [place] [facility] Jack[facility]yn Confer, MD   3 months ago Allergic reaction to drug, subsequent encounter   [place] [facility], Sher[facility] Needs, NP       Future Appointments             In 1 month Gollan, Tollie Pizza, MD [place] [facility] [place]   In 1 month Evelene Croon, Atilano Median, MD  [facility]           Off-Protocol Failed -  08/24/2023  2:49 PM      Failed - Medication not assigned to a protocol, review manually.      Passed - Valid encounter within last 12 months    Recent Outpatient Visits           1 month ago Viral gastroenteritis   [place] [facility] Jackolyn Confer, MD   2 months ago Irritable bowel syndrome with both constipation and diarrhea   [place] [facility] Jack[facility]yn Confer, MD   2 months ago Dizziness   [place] [facility] Jack[facility]yn Confer, MD   2 months ago Irritable bowel syndrome with both  constipation and diarrhea   [place] [facility] Jack[facility]yn Confer, MD   3 months ago Allergic reaction to drug, subsequent encounter   [place] [facility], Sherran Needs, NP       Future Appointments             In 1 month Gollan, Tollie Pizza, MD [place] [facility] [place]   In 1 month Evelene Croon, Atilano Median, MD [facility]            Signed Prescriptions Disp Refills   loperamide (IMODIUM A-D) 2 MG tablet 30 tablet 0    Sig: Take 1 tablet (2 mg total) by mouth 4 (four) times daily as needed for diarrhea or loose stools.     Over the Counter:  OTC Passed - 08/24/2023  2:49 PM      Passed - Valid encounter within last 12 months    Recent Outpatient Visits           1 month ago Viral gastroenteritis   [place] [facility] Jack[facility] Confer, MD   2 months ago Irritable bowel syndrome with both constipation and diarrhea   [place] [facility] Jackolyn Confer, MD   2 months ago Dizziness   [place] [facility] Confer, MD   2 months ago Irritable bowel syndrome with both constipation and diarrhea   [place] [facility] Jack[facility]yn Confer, MD   3 months ago Allergic reaction to drug, subsequent encounter   [place] [facility], Sher[facility] Needs, NP       Future Appointments             In 1 month Gollan, Tollie Pizza, MD [place] [facility] [place]   In 1 month Evelene Croon, Atilano Median, MD [place] [facility]            Refused Prescriptions Disp Refills   busPIRone (BUSPAR) 5 MG tablet 60 tablet 0    Sig: Take 1 tablet (5 mg total) by mouth 2 (two) times daily as needed.     Psychiatry: Anxiolytics/Hypnotics - Non-controlled Passed - 08/24/2023  2:49 PM      Passed - Valid encounter within last 12 months    Recent Outpatient Visits           1 month ago Viral gastroenteritis   [facility], MD   2 months ago Irritable bowel syndrome with both constipation and diarrhea   [place] [facility] Jack[facility] Confer, MD   2 months ago Dizziness   [place] [facility] Jackolyn Confer, MD   2 months ago Irritable bowel syndrome with both constipation and diarrhea  [place] [facility] Jackolyn Confer, MD   3 months ago Allergic reaction to drug, subsequent encounter   [place] [facility], Sher[facility] Needs, NP       Future Appointments             In 1 month Gollan, Tollie Pizza, MD  [facility] [place]   In 1 month Evelene Croon, Atilano Median, MD [facility]             f

## 2023-08-24 NOTE — Telephone Encounter (Signed)
Medication Refill -  Most Recent Primary Care Visit:  Provider: Jackolyn Confer  Department: [facility]  Visit Type: OFFICE VISIT  Date: 07/22/2023  Medication:  butalbital-aspirin-caffeine (FIORINAL) 50-325-40 MG capsule clonazePAM (KLONOPIN) 0.5 MG tablet traZODone (DESYREL) 100 MG tablet ondansetron (ZOFRAN) 4 MG tablet busPIRone (BUSPAR) 5 MG tablet   Has the patient contacted their pharmacy? No  Is this the correct pharmacy for this prescription? Yes  This is the patient's preferred pharmacy:  [id number] [place],  - [address] [place] [address] Phone: [phone] Fax: [phone]   Has the prescription been filled recently? Yes  Is the patient out of the medication? Yes  Has the patient been seen for an appointment in the last year OR does the patient have an upcoming appointment? Yes  Can we respond through MyChart? No  Agent: Please be advised that Rx refills may take up to 3 business days. We ask that you follow-up with your pharmacy.

## 2023-08-24 NOTE — Telephone Encounter (Signed)
Requested Prescriptions  Pending Prescriptions Disp Refills   butalbital-aspirin-caffeine (FIORINAL) 50-325-40 MG capsule 60 capsule 1    Sig: Take 1 capsule by mouth 2 (two) times daily as needed for headache or migraine.     Not Delegated - Analgesics:  Non-Opioid Analgesic Combinations 2 Failed - 08/24/2023  2:47 PM      Failed - This refill cannot be delegated      Passed - Cr in normal range and within 360 days    Creatinine, Ser  Date Value Ref Range Status  06/24/2023 0.83 0.57 - 1.00 mg/dL Final         Passed - eGFR is 10 or above and within 360 days    GFR calc Af Amer  Date Value Ref Range Status  05/12/2016 >60 >60 mL/min Final    Comment:    (NOTE) The eGFR has been calculated using the CKD EPI equation. This calculation has not been validated in all clinical situations. eGFR's persistently <60 mL/min signify possible Chronic Kidney Disease.    GFR calc non Af Amer  Date Value Ref Range Status  05/12/2016 >60 >60 mL/min Final   eGFR  Date Value Ref Range Status  06/24/2023 73 >59 mL/min/1.73 Final         Passed - Patient is not pregnant      Passed - Valid encounter within last 12 months    Recent Outpatient Visits           1 month ago Viral gastroenteritis   [place] [facility] Jackolyn Confer, MD   2 months ago Irritable bowel syndrome with both constipation and diarrhea   [place] [facility] Jackolyn Confer, MD   2 months ago Dizziness   [facility], MD   2 months ago Irritable bowel syndrome with both constipation and diarrhea   [place] [facility] Jack[facility]yn Confer, MD   3 months ago Allergic reaction to drug, subsequent encounter   [place] [facility], Sherran Needs, NP       Future Appointments             In 1 month Gollan, Tollie Pizza, MD [facility] [place]   In 1 month Evelene Croon, Atilano Median, MD [place]  [facility]             clonazePAM (KLONOPIN) 0.5 MG tablet 60 tablet 3    Sig: Take 1 tablet (0.5 mg total) by mouth 2 (two) times daily as needed for anxiety.     Not Delegated - Psychiatry: Anxiolytics/Hypnotics 2 Failed - 08/24/2023  2:47 PM      Failed - This refill cannot be delegated      Failed - Urine Drug Screen completed in last 360 days      Passed - Patient is not pregnant      Passed - Valid encounter within last 6 months    Recent Outpatient Visits           1 month ago Viral gastroenteritis   [facility] [facility] Jack[facility]yn Confer, MD   2 months ago Irritable bowel syndrome with both constipation and diarrhea   [place] [facility] Jack[facility] Confer, MD   2 months ago Dizziness    [facility], MD   2 months ago Irritable bowel syndrome with both constipation and diarrhea  [place] [facility] Jackolyn Confer, MD   3 months ago Allergic reaction to drug, subsequent encounter   [place], Sherran Needs, NP       Future Appointments             In 1 month Gollan, Tollie Pizza, MD [place] [facility] [place]   In 1 month Evelene Croon, Atilano Median, MD [place] [facility]             traZODone (DESYREL) 100 MG tablet  3    Sig: Take 1 tablet (100 mg total) by mouth at bedtime.     Psychiatry: Antidepressants - Serotonin Modulator Passed - 08/24/2023  2:47 PM      Passed - Completed PHQ-2 or PHQ-9 in the last 360 days      Passed - Valid encounter within last 6 months    Recent Outpatient Visits           1 month ago Viral gastroenteritis   [place] [facility] Jack[facility]yn Confer, MD   2 months ago Irritable bowel syndrome with both constipation and diarrhea   [place] [facility] Jackolyn Confer, MD   2 months ago Dizziness   [place] [facility]  Jack[facility]yn [facility], MD   2 months ago Irritable bowel syndrome with both constipation and diarrhea   [place] [facility] Jack[facility]yn Confer, MD   3 months ago Allergic reaction to drug, subsequent encounter   [place] [facility], Sherran Needs, NP       Future Appointments             In 1 month Gollan, Tollie Pizza, MD [place] [facility] [place]   In 1 month Evelene Croon, Atilano Median, MD [place] [facility]             ondansetron (ZOFRAN) 4 MG tablet 20 tablet 0    Sig: Take 1 tablet (4 mg total) by mouth every 8 (eight) hours as needed for nausea or vomiting.     Not Delegated - Gastroenterology: Antiemetics - ondansetron Failed - 08/24/2023  2:47 PM      Failed - This refill cannot be delegated      Passed - AST in normal range and within 360 days    AST  Date Value Ref Range Status  06/24/2023 16 0 - 40 IU/L Final         Passed - ALT in normal range and within 360 days    ALT  Date Value Ref Range Status  06/24/2023 9 0 - 32 IU/L Final         Passed - Valid encounter within last 6 months    Recent Outpatient Visits           1 month ago Viral gastroenteritis   [place] [facility] Con[facility], MD   2 months ago Irritable bowel syndrome with both constipation and diarrhea   [place] [facility] Jackolyn Confer, MD   2 months ago Dizziness   [place] [facility] Jack[facility]yn Confer, MD   2 months ago Irritable bowel syndrome with both constipation and diarrhea   [place] [facility] Jack[facility]yn Confer, MD   3 months ago Allergic reaction to drug, subsequent encounter    [facility] Weber Cooks, NP  Future Appointments             In 1 month Gollan, Tollie Pizza, MD [place] [facility] [place]   In 1 month [name], MD [place] [facility]              busPIRone (BUSPAR) 5 MG tablet 60 tablet 0    Sig: Take 1 tablet (5 mg total) by mouth 2 (two) times daily as needed.     Psychiatry: Anxiolytics/Hypnotics - Non-controlled Passed - 08/24/2023  2:47 PM      Passed - Valid encounter within last 12 months    Recent Outpatient Visits           1 month ago Viral gastroenteritis   [place] [facility] Jack[facility]yn Confer, MD   2 months ago Irritable bowel syndrome with both constipation and diarrhea   [place] [facility] Jackolyn Con[facility], MD   2 months ago Dizziness   [place] [facility] Jackolyn Confer, MD   2 months ago Irritable bowel syndrome with both constipation and diarrhea   [place] [facility] Jackolyn Confer, MD   3 months ago Allergic reaction to drug, subsequent encounter   [place] [facility], Sherran Needs, NP       Future Appointments             In 1 month Gollan, Tollie Pizza, MD [place] [facility] [place]   In 1 month Jackolyn Confer, MD [place] [facility]             omeprazole (PRILOSEC) 20 MG capsule 30 capsule     Sig: Take 1 capsule (20 mg total) by mouth daily.     Gastroenterology: Proton Pump Inhibitors Passed - 08/24/2023  2:47 PM      Passed - Valid encounter within last 12 months    Recent Outpatient Visits           1 month ago Viral gastroenteritis   [place] [facility] Jack[facility]yn Confer, MD   2 months ago Irritable bowel syndrome with both constipation and diarrhea   [place] [facility] Con[facility], MD   2 months ago Dizziness   [place] [facility] Jackolyn Confer, MD   2 months ago Irritable bowel syndrome with both constipation and diarrhea   [place] [facility] Jackolyn Confer, MD   3 months ago Allergic reaction to drug, subsequent encounter   [place] [facility], Sherran Needs, NP       Future Appointments             In 1 month Gollan, Tollie Pizza, MD [place] [facility] [place]   In 1 month Jackolyn Confer, MD [place] [facility]             losartan (COZAAR) 25 MG tablet      Sig: Take 2 tablets (50 mg total) by mouth daily.     Cardiovascular:  Angiotensin Receptor Blockers Passed - 08/24/2023  2:47 PM      Passed - Cr in normal range and within 180 days    Creatinine, Ser  Date Value Ref Range Status  06/24/2023 0.83 0.57 - 1.00 mg/dL Final         Passed - K in normal range and within 180 days  Potassium  Date Value Ref Range Status  06/24/2023 4.2 3.5 - 5.2 mmol/L Final         Passed - Patient is not pregnant      Passed - Last BP in normal range    BP Readings from Last 1 Encounters:  07/22/23 134/85         Passed - Valid encounter within last 6 months    Recent Outpatient Visits           1 month ago Viral gastroenteritis   [place] [facility] Con[facility], MD   2 months ago Irritable bowel syndrome with both constipation and diarrhea   [place] [facility] Jack[facility] Con[facility], MD   2 months ago Dizziness   [place] [facility] Jackolyn Confer, MD   2 months ago Irritable bowel syndrome with both constipation and diarrhea   [place] [facility] Jack[facility]yn Confer, MD   3 months ago Allergic reaction to drug, subsequent encounter   [place] [facility], Sherran Needs, NP       Future Appointments             In 1 month Gollan, Tollie Pizza, MD [place] [facility] [place]   In 1 month Evelene Croon, Atilano Median, MD [place] [facility]             albuterol (VENTOLIN HFA) 108 (90 Base) MCG/ACT inhaler      Sig: Inhale 2 puffs into the lungs every 6 (six) hours as needed for wheezing or shortness of breath.     Pulmonology:  Beta Agonists 2 Passed - 08/24/2023  2:47 PM       Passed - Last BP in normal range    BP Readings from Last 1 Encounters:  07/22/23 134/85         Passed - Last Heart Rate in normal range    Pulse Readings from Last 1 Encounters:  07/22/23 70         Passed - Valid encounter within last 12 months    Recent Outpatient Visits           1 month ago Viral gastroenteritis   [place] [facility] Jack[facility] Con[facility] MD   2 months ago Irritable bowel syndrome with both constipation and diarrhea   [place] [facility] Jackolyn Confer, MD   2 months ago Dizziness   [place] [facility] Jack[facility]yn Confer, MD   2 months ago Irritable bowel syndrome with both constipation and diarrhea   [place] [facility] Jackolyn Confer, MD   3 months ago Allergic reaction to drug, subsequent encounter   [place] [facility], Sherran Needs, NP       Future Appointments             In 1 month Gollan, Tollie Pizza, MD [place] [facility] [place]   In 1 month Jackolyn Confer, MD [place] [facility]             ipratropium (ATROVENT) 0.06 % nasal spray 15 mL     Sig: Place into both nostrils.     Off-Protocol Failed - 08/24/2023  2:47 PM      Failed - Medication not assigned to a protocol, review manually.      Passed - Valid encounter within last 12 months  Recent Outpatient Visits           1 month ago Viral gastroenteritis   [place] [facility] Jackolyn Confer, MD   2 months ago Irritable bowel syndrome with both constipation and diarrhea   [place] [facility] Jackolyn Confer, MD   2 months ago Dizziness   [place] [facility] Jack[facility]yn Confer, MD   2 months ago Irritable bowel syndrome with both constipation and diarrhea   [place] [facility] Jackolyn Confer, MD   3 months ago Allergic reaction to drug, subsequent encounter   [place] [facility], Sherran Needs, NP       Future Appointments             In 1 month Gollan, Tollie Pizza, MD [place] [facility] [place]   In 1 month [name], MD [facility]           Off-Protocol Failed - 08/24/2023  2:47 PM      Failed - Medication not assigned to a protocol, review manually.      Passed - Valid encounter within last 12 months    Recent Outpatient Visits           1 month ago Viral gastroenteritis   [place] [facility] Jack[facility]yn Confer, MD   2 months ago Irritable bowel syndrome with both constipation and diarrhea   [place] [facility] Jackolyn Confer, MD   2 months ago Dizziness   [place] [facility] Jackolyn Confer, MD   2 months ago Irritable bowel syndrome with both constipation and diarrhea   [place] [facility] Jack[facility]yn Confer, MD   3 months ago Allergic reaction to drug, subsequent encounter   [place] [facility], Sherran Needs, NP       Future Appointments             In 1 month Gollan, Tollie Pizza, MD [place] [facility] [place]   In 1 month Evelene Croon, Atilano Median, MD [place] [facility]             loperamide (IMODIUM A-D) 2 MG tablet 30 tablet 0    Sig: Take 1 tablet (2 mg total) by mouth 4 (four) times daily as needed for diarrhea or loose stools.     Over the Counter:  OTC Passed - 08/24/2023  2:47 PM      Passed - Valid encounter within last 12 months    Recent Outpatient Visits           1 month ago Viral gastroenteritis   [facility] Jack[facility] Confer, MD   2 months ago Irritable bowel syndrome with both constipation and diarrhea   [place] [facility] Jackolyn Confer, MD   2 months ago Dizziness   [place] [facility] Jackolyn Confer, MD   2 months ago Irritable bowel syndrome with both constipation and  diarrhea    [facility] Jack[facility] Con[facility], MD   3 months ago Allergic reaction to drug, subsequent encounter    [facility], Sherran Needs, NP       Future Appointments             In 1 month Gollan, Tollie Pizza, MD [facility]  In 1 month Evelene Croon, Atilano Median, MD [facility]

## 2023-09-07 ENCOUNTER — Inpatient Hospital Stay
Admission: EM | Admit: 2023-09-07 | Discharge: 2023-09-09 | DRG: 643 | Disposition: A | Discharge status: Home Health | Payer: Medicare Other | Attending: Internal Medicine | Admitting: Internal Medicine

## 2023-09-07 ENCOUNTER — Other Ambulatory Visit: Payer: Self-pay

## 2023-09-07 ENCOUNTER — Emergency Department: Payer: Medicare Other

## 2023-09-07 DIAGNOSIS — Z79899 Other long term (current) drug therapy: Secondary | ICD-10-CM | POA: Diagnosis not present

## 2023-09-07 DIAGNOSIS — R296 Repeated falls: Secondary | ICD-10-CM | POA: Diagnosis not present

## 2023-09-07 DIAGNOSIS — E222 Syndrome of inappropriate secretion of antidiuretic hormone: Secondary | ICD-10-CM | POA: Diagnosis not present

## 2023-09-07 DIAGNOSIS — R531 Weakness: Secondary | ICD-10-CM | POA: Diagnosis not present

## 2023-09-07 DIAGNOSIS — R059 Cough, unspecified: Secondary | ICD-10-CM | POA: Diagnosis not present

## 2023-09-07 DIAGNOSIS — Z800 Family history of malignant neoplasm of digestive organs: Secondary | ICD-10-CM

## 2023-09-07 DIAGNOSIS — U071 COVID-19: Secondary | ICD-10-CM | POA: Diagnosis not present

## 2023-09-07 DIAGNOSIS — Z888 Allergy status to other drugs, medicaments and biological substances status: Secondary | ICD-10-CM

## 2023-09-07 DIAGNOSIS — F419 Anxiety disorder, unspecified: Secondary | ICD-10-CM | POA: Diagnosis not present

## 2023-09-07 DIAGNOSIS — R9389 Abnormal findings on diagnostic imaging of other specified body structures: Secondary | ICD-10-CM | POA: Diagnosis not present

## 2023-09-07 DIAGNOSIS — Z882 Allergy status to sulfonamides status: Secondary | ICD-10-CM

## 2023-09-07 DIAGNOSIS — I10 Essential (primary) hypertension: Secondary | ICD-10-CM | POA: Diagnosis not present

## 2023-09-07 DIAGNOSIS — R509 Fever, unspecified: Secondary | ICD-10-CM | POA: Diagnosis not present

## 2023-09-07 DIAGNOSIS — R0989 Other specified symptoms and signs involving the circulatory and respiratory systems: Secondary | ICD-10-CM | POA: Diagnosis not present

## 2023-09-07 DIAGNOSIS — E871 Hypo-osmolality and hyponatremia: Secondary | ICD-10-CM | POA: Diagnosis not present

## 2023-09-07 DIAGNOSIS — R112 Nausea with vomiting, unspecified: Secondary | ICD-10-CM | POA: Diagnosis not present

## 2023-09-07 DIAGNOSIS — J4489 Other specified chronic obstructive pulmonary disease: Secondary | ICD-10-CM | POA: Diagnosis present

## 2023-09-07 DIAGNOSIS — Z8249 Family history of ischemic heart disease and other diseases of the circulatory system: Secondary | ICD-10-CM | POA: Diagnosis not present

## 2023-09-07 DIAGNOSIS — J449 Chronic obstructive pulmonary disease, unspecified: Secondary | ICD-10-CM | POA: Diagnosis present

## 2023-09-07 DIAGNOSIS — F32A Depression, unspecified: Secondary | ICD-10-CM | POA: Diagnosis present

## 2023-09-07 DIAGNOSIS — R110 Nausea: Secondary | ICD-10-CM | POA: Diagnosis not present

## 2023-09-07 LAB — CBC WITH DIFFERENTIAL/PLATELET
Abs Immature Granulocytes: 0.05 10*3/uL (ref 0.00–0.07)
Basophils Absolute: 0 10*3/uL (ref 0.0–0.1)
Basophils Relative: 0 %
Eosinophils Absolute: 0.1 10*3/uL (ref 0.0–0.5)
Eosinophils Relative: 1 %
HCT: 32.2 % — ABNORMAL LOW (ref 36.0–46.0)
Hemoglobin: 11.3 g/dL — ABNORMAL LOW (ref 12.0–15.0)
Immature Granulocytes: 1 %
Lymphocytes Relative: 6 %
Lymphs Abs: 0.4 10*3/uL — ABNORMAL LOW (ref 0.7–4.0)
MCH: 30.7 pg (ref 26.0–34.0)
MCHC: 35.1 g/dL (ref 30.0–36.0)
MCV: 87.5 fL (ref 80.0–100.0)
Monocytes Absolute: 0.8 10*3/uL (ref 0.1–1.0)
Monocytes Relative: 11 %
Neutro Abs: 5.5 10*3/uL (ref 1.7–7.7)
Neutrophils Relative %: 81 %
Platelets: 184 10*3/uL (ref 150–400)
RBC: 3.68 MIL/uL — ABNORMAL LOW (ref 3.87–5.11)
RDW: 12.7 % (ref 11.5–15.5)
WBC: 6.9 10*3/uL (ref 4.0–10.5)
nRBC: 0 % (ref 0.0–0.2)

## 2023-09-07 LAB — COMPREHENSIVE METABOLIC PANEL
ALT: 16 U/L (ref 0–44)
AST: 24 U/L (ref 15–41)
Albumin: 3.3 g/dL — ABNORMAL LOW (ref 3.5–5.0)
Alkaline Phosphatase: 54 U/L (ref 38–126)
Anion gap: 10 (ref 5–15)
BUN: 11 mg/dL (ref 8–23)
CO2: 24 mmol/L (ref 22–32)
Calcium: 8.2 mg/dL — ABNORMAL LOW (ref 8.9–10.3)
Chloride: 86 mmol/L — ABNORMAL LOW (ref 98–111)
Creatinine, Ser: 0.65 mg/dL (ref 0.44–1.00)
GFR, Estimated: >60 mL/min (ref >60)
Glucose, Bld: 89 mg/dL (ref 70–99)
Potassium: 3.7 mmol/L (ref 3.5–5.1)
Sodium: 120 mmol/L — ABNORMAL LOW (ref 135–145)
Total Bilirubin: 0.5 mg/dL (ref 0.0–1.2)
Total Protein: 6.4 g/dL — ABNORMAL LOW (ref 6.5–8.1)

## 2023-09-07 LAB — RESP PANEL BY RT-PCR (RSV, FLU A&B, COVID)  RVPGX2
Influenza A by PCR: NEGATIVE
Influenza B by PCR: NEGATIVE
Resp Syncytial Virus by PCR: NEGATIVE
SARS Coronavirus 2 by RT PCR: POSITIVE — AB

## 2023-09-07 MED ORDER — SODIUM CHLORIDE 0.9 % IV BOLUS
1000.0000 mL | Freq: Once | INTRAVENOUS | Status: AC
  Administered 2023-09-08: 1000 mL via INTRAVENOUS

## 2023-09-07 MED ORDER — CLONAZEPAM 0.5 MG PO TABS: 0.5000 mg | ORAL_TABLET | Freq: Two times a day (BID) | ORAL | 1 refills | Status: DC | PRN

## 2023-09-07 MED ORDER — BUTALBITAL-ASPIRIN-CAFFEINE 50-325-40 MG PO CAPS: 1.0000 | ORAL_CAPSULE | Freq: Two times a day (BID) | ORAL | 1 refills | Status: DC | PRN

## 2023-09-07 MED ORDER — ALBUTEROL SULFATE HFA 108 (90 BASE) MCG/ACT IN AERS: 1.0000 | INHALATION_SPRAY | Freq: Four times a day (QID) | RESPIRATORY_TRACT | 1 refills | Status: DC | PRN

## 2023-09-07 MED ORDER — IPRATROPIUM BROMIDE 0.06 % NA SOLN: 1.0000 | Freq: Three times a day (TID) | NASAL | 1 refills | Status: DC | PRN

## 2023-09-07 MED ORDER — TRAZODONE HCL 100 MG PO TABS: 100.0000 mg | ORAL_TABLET | Freq: Every day | ORAL | 1 refills | Status: DC

## 2023-09-07 MED ORDER — ONDANSETRON HCL 4 MG/2ML IJ SOLN
4.0000 mg | Freq: Once | INTRAMUSCULAR | Status: DC
  Filled 2023-09-07: qty 2

## 2023-09-07 MED ORDER — OMEPRAZOLE 20 MG PO CPDR: 20.0000 mg | DELAYED_RELEASE_CAPSULE | Freq: Two times a day (BID) | ORAL | 1 refills | Status: DC

## 2023-09-07 MED ORDER — ONDANSETRON HCL 4 MG PO TABS: 4.0000 mg | ORAL_TABLET | Freq: Three times a day (TID) | ORAL | 0 refills | Status: DC | PRN

## 2023-09-07 NOTE — ED Provider Notes (Incomplete)
[facility] Provider Note    Event Date/Time   First MD Initiated Contact with Patient 09/07/23 2328     (approximate)   History   Cough and Nausea   HPI  Brooke [name] is a 77 y.o. female brought to the ED via EMS from home with a chief complaint of cough, generalized weakness and nausea beginning this week.  Granddaughter reports recurrent falls despite using her cane to ambulate.  Too weak to get herself up today.  Denies injury from the falls.  Denies fever/chills, chest pain, shortness of breath, abdominal pain, vomiting or diarrhea.  Granddaughter reports dark and foul odor to urine.     Past Medical History   Past Medical History:  Diagnosis Date  . Asthma      Active Problem List   Patient Active Problem List   Diagnosis Date Noted  . Allergic drug reaction 05/21/2023  . Polypharmacy 05/05/2022  . Irritable bowel syndrome with both constipation and diarrhea 11/12/2020  . 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 Sensorineural hearing loss (SNHL) of right ear with unrestricted hearing of left ear 02/15/2017  . Sepsis (HCC) 05/11/2016  . Fever of unknown origin 05/11/2016  . Lower urinary tract infectious disease 03/31/2016  . Trigeminal neuralgia 10/28/2015  . Anxiety 12/28/2012  . Major depressive disorder with single episode, in full remission (HCC) 12/28/2012  . COPD (chronic obstructive pulmonary disease) (HCC) 10/14/2010     Past Surgical History   Past Surgical History:  Procedure Laterality Date  . PARTIAL HYSTERECTOMY    . TEMPORAL ARTERY BIOPSY / LIGATION       Home Medications   Prior to Admission medications   Medication Sig Start Date End Date Taking? Authorizing Provider  albuterol (VENTOLIN HFA) 108 (90 Base) MCG/ACT inhaler Inhale 1-2 puffs into the lungs every 6 (six) hours as needed for wheezing or shortness of breath. 09/07/23   Jackolyn Confer, MD  butalbital-aspirin-caffeine [facility])  [phone] MG capsule Take 1 capsule by mouth 2 (two) times daily as needed for headache or migraine. 09/07/23 11/06/23  Jackolyn Confer, MD  clonazePAM (KLONOPIN) 0.5 MG tablet Take 1 tablet (0.5 mg total) by mouth 2 (two) times daily as needed for anxiety. 09/07/23 12/06/23  Jackolyn Confer, MD  FLUoxetine (PROZAC) 20 MG capsule Take 2 capsules (40 mg total) by mouth 2 (two) times daily. 05/12/23 07/22/23  Jackolyn Confer, MD  gabapentin (NEURONTIN) 100 MG capsule Take 1 capsule (100 mg total) by mouth 2 (two) times daily. 06/02/23   Jackolyn Confer, MD  ipratropium (ATROVENT) 0.06 % nasal spray Place 1 spray into both nostrils 3 (three) times daily as needed for rhinitis. 09/07/23   Jackolyn Confer, MD  loperamide (IMODIUM A-D) 2 MG tablet Take 1 tablet (2 mg total) by mouth 4 (four) times daily as needed for diarrhea or loose stools. 08/24/23   Jackolyn Confer, MD  losartan (COZAAR) 25 MG tablet Take 50 mg by mouth daily. 03/13/16   [provider]  omeprazole (PRILOSEC) 20 MG capsule Take 1 capsule (20 mg total) by mouth 2 (two) times daily before a meal. 09/07/23   Jackolyn Confer, MD  ondansetron (ZOFRAN) 4 MG tablet Take 1 tablet (4 mg total) by mouth every 8 (eight) hours as needed for nausea or vomiting. 09/07/23   Jackolyn Confer, MD  traZODone (DESYREL) 100 MG tablet Take 1 tablet (100 mg total) by mouth at bedtime. 09/07/23  Jackolyn Confer, MD     Allergies  Nystatin and Sulfa antibiotics   Family History   Family History  Problem Relation Age of Onset  . CAD Father   . Colon cancer Father      Physical Exam  Triage Vital Signs: ED Triage Vitals  Encounter Vitals Group     BP 09/07/23 1438 126/84     Systolic BP Percentile --      Diastolic BP Percentile --      Pulse Rate 09/07/23 1438 74     Resp 09/07/23 1438 16     Temp 09/07/23 1438 100 F (37.8 C)     Temp Source 09/07/23 1438 Oral     SpO2 09/07/23 1438 95 %     Weight --      Height --      Head Circumference --       Peak Flow --      Pain Score 09/07/23 1441 0     Pain Loc --      Pain Education --      Exclude from Growth Chart --     Updated Vital Signs: BP 101/86   Pulse 77   Temp 100.2 F (37.9 C) (Oral)   Resp 18   SpO2 96%    General: Awake, mild distress.  Mildly dry mucous membranes. CV:  RRR.  Good peripheral perfusion.  Resp:  Normal effort.  CTAB. Abd:  Nontender.  No distention.  Other:  No truncal vesicles.   ED Results / Procedures / Treatments  Labs (all labs ordered are listed, but only abnormal results are displayed) Labs Reviewed  RESP PANEL BY RT-PCR (RSV, FLU A&B, COVID)  RVPGX2 - Abnormal; Notable for the following components:      Result Value   SARS Coronavirus 2 by RT PCR POSITIVE (*)    All other components within normal limits  CBC WITH DIFFERENTIAL/PLATELET - Abnormal; Notable for the following components:   RBC 3.68 (*)    Hemoglobin 11.3 (*)    HCT 32.2 (*)    Lymphs Abs 0.4 (*)    All other components within normal limits  COMPREHENSIVE METABOLIC PANEL - Abnormal; Notable for the following components:   Sodium 120 (*)    Chloride 86 (*)    Calcium 8.2 (*)    Total Protein 6.4 (*)    Albumin 3.3 (*)    All other components within normal limits  URINALYSIS, ROUTINE W REFLEX MICROSCOPIC     EKG  ED ECG REPORT I, Omarri Eich J, the attending physician, personally viewed and interpreted this ECG.   Date: 09/07/2023  EKG Time: ***  Rate: ***  Rhythm: {ekg findings:315101}  Axis: ***  Intervals:{conduction defects:17367}  ST&T Change: ***    RADIOLOGY I have independently visualized and interpreted patient's imaging study as well as noted the radiology interpretation:  Chest x-ray: No acute cardiopulmonary process  Official radiology report(s): DG Chest 2 View Result Date: 09/07/2023 CLINICAL DATA:  Cough. EXAM: CHEST - 2 VIEW COMPARISON:  04/13/2023. FINDINGS: Low lung volume. Bilateral lung fields are clear. Bilateral costophrenic  angles are clear. Note is made of elevated left hemidiaphragm. Stable cardio-mediastinal silhouette. No acute osseous abnormalities. The soft tissues are within normal limits. IMPRESSION: No active cardiopulmonary disease. Electronically Signed   By: Jules Schick M.D.   On: 09/07/2023 16:05     PROCEDURES:  Critical Care performed: No  .1-3 Lead EKG Interpretation  Performed by: Irean Hong, MD  Authorized by: Irean Hong, MD     Interpretation: normal     ECG rate:  75   ECG rate assessment: normal     Rhythm: sinus rhythm     Ectopy: none     Conduction: normal   Comments:     Patient placed on cardiac monitor to evaluate for arrhythmias    MEDICATIONS ORDERED IN ED: Medications  sodium chloride 0.9 % bolus 1,000 mL (has no administration in time range)  ondansetron (ZOFRAN) injection 4 mg (has no administration in time range)     IMPRESSION / MDM / ASSESSMENT AND PLAN / ED COURSE  I reviewed the triage vital signs and the nursing notes.                             78 year old female presenting with generalized weakness, cough and nausea.  Differential diagnosis includes but is not limited to ACS, infectious, metabolic etiologies, etc.  I personally reviewed patient's records and note a PCP office visit from 07/22/2023 for viral gastroenteritis.  Patient's presentation is most consistent with acute complicated illness / injury requiring diagnostic workup.  The patient is on the cardiac monitor to evaluate for evidence of arrhythmia and/or significant heart rate changes.  Laboratory results significant for hyponatremia with sodium 120 which is new from prior.  Patient is COVID-positive with normal chest x-ray, not tachypneic nor hypoxic.  Will check troponin, EKG and UA.  Initiate IV fluid resuscitation, IV Zofran for nausea.  Will consult hospitalist services for evaluation and admission.      FINAL CLINICAL IMPRESSION(S) / ED DIAGNOSES   Final diagnoses:   Generalized weakness  Recurrent falls  Nausea     Rx / DC Orders   ED Discharge Orders     None        Note:  This document was prepared using Dragon voice recognition software and may include unintentional dictation errors.

## 2023-09-07 NOTE — ED Triage Notes (Signed)
Pt arrives via [facility] from home. Pt is poor historian and just replies, "Ask my daughter." Pt endorses cough that "started this week." Pt denies hx of UTI. Pt denies recent falls and says she uses a cane to ambulate. Pt reports nausea "all morning."

## 2023-09-07 NOTE — ED Provider Notes (Signed)
 [facility] Provider Note    Event Date/Time   First MD Initiated Contact with Patient 09/07/23 2328     (approximate)   History   Cough and Nausea   HPI  Brooke [name] is a 78 y.o. female brought to the ED via EMS from home with a chief complaint of cough, generalized weakness and nausea beginning this week.  Granddaughter reports recurrent falls despite using her cane to ambulate.  Too weak to get herself up today.  Denies injury from the falls.  Denies fever/chills, chest pain, shortness of breath, abdominal pain, vomiting or diarrhea.  Granddaughter reports dark and foul odor to urine.     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   Sensorineural hearing loss (SNHL) of right ear with unrestricted hearing of left ear 02/15/2017   Sepsis (HCC) 05/11/2016   Fever of unknown origin 05/11/2016   Lower urinary tract infectious disease 03/31/2016   Trigeminal neuralgia 10/28/2015   Anxiety 12/28/2012   Major depressive disorder with single episode, in full remission (HCC) 12/28/2012   COPD (chronic obstructive pulmonary disease) (HCC) 10/14/2010     Past Surgical History   Past Surgical History:  Procedure Laterality Date   PARTIAL HYSTERECTOMY     TEMPORAL ARTERY BIOPSY / LIGATION       Home Medications   Prior to Admission medications   Medication Sig Start Date End Date Taking? Authorizing Provider  albuterol  (VENTOLIN  HFA) 108 (90 Base) MCG/ACT inhaler Inhale 1-2 puffs into the lungs every 6 (six) hours as needed for wheezing or shortness of breath. 09/07/23   Herold Hadassah SQUIBB, MD  butalbital -aspirin -caffeine  (FIORINAL ) 50-325-40 MG  capsule Take 1 capsule by mouth 2 (two) times daily as needed for headache or migraine. 09/07/23 11/06/23  Herold Hadassah SQUIBB, MD  clonazePAM  (KLONOPIN ) 0.5 MG tablet Take 1 tablet (0.5 mg total) by mouth 2 (two) times daily as needed for anxiety. 09/07/23 12/06/23  Herold Hadassah SQUIBB, MD  FLUoxetine  (PROZAC ) 20 MG capsule Take 2 capsules (40 mg total) by mouth 2 (two) times daily. 05/12/23 07/22/23  Herold Hadassah SQUIBB, MD  gabapentin  (NEURONTIN ) 100 MG capsule Take 1 capsule (100 mg total) by mouth 2 (two) times daily. 06/02/23   Herold Hadassah SQUIBB, MD  ipratropium (ATROVENT ) 0.06 % nasal spray Place 1 spray into both nostrils 3 (three) times daily as needed for rhinitis. 09/07/23   Herold Hadassah SQUIBB, MD  loperamide  (IMODIUM  A-D) 2 MG tablet Take 1 tablet (2 mg total) by mouth 4 (four) times daily as needed for diarrhea or loose stools. 08/24/23   Herold Hadassah SQUIBB, MD  losartan  (COZAAR ) 25 MG tablet Take 50 mg by mouth daily. 03/13/16   [provider]  omeprazole  (PRILOSEC) 20 MG capsule Take 1 capsule (20 mg total) by mouth 2 (two) times daily before a meal. 09/07/23   Herold Hadassah SQUIBB, MD  ondansetron  (ZOFRAN ) 4 MG tablet Take 1 tablet (4 mg total) by mouth every 8 (eight) hours as needed for nausea or vomiting. 09/07/23   Herold Hadassah SQUIBB, MD  traZODone  (DESYREL ) 100 MG tablet Take 1 tablet (100 mg total) by mouth at bedtime. 09/07/23  Herold Hadassah SQUIBB, MD     Allergies  Nystatin  and Sulfa antibiotics   Family History   Family History  Problem Relation Age of Onset   CAD Father    Colon cancer Father      Physical Exam  Triage Vital Signs: ED Triage Vitals  Encounter Vitals Group     BP 09/07/23 1438 126/84     Systolic BP Percentile --      Diastolic BP Percentile --      Pulse Rate 09/07/23 1438 74     Resp 09/07/23 1438 16     Temp 09/07/23 1438 100 F (37.8 C)     Temp Source 09/07/23 1438 Oral     SpO2 09/07/23 1438 95 %     Weight --      Height --      Head Circumference --      Peak Flow  --      Pain Score 09/07/23 1441 0     Pain Loc --      Pain Education --      Exclude from Growth Chart --     Updated Vital Signs: BP 101/86   Pulse 77   Temp 100.2 F (37.9 C) (Oral)   Resp 18   SpO2 96%    General: Awake, mild distress.  Mildly dry mucous membranes. CV:  RRR.  Good peripheral perfusion.  Resp:  Normal effort.  CTAB. Abd:  Nontender.  No distention.  Other:  No truncal vesicles.   ED Results / Procedures / Treatments  Labs (all labs ordered are listed, but only abnormal results are displayed) Labs Reviewed  RESP PANEL BY RT-PCR (RSV, FLU A&B, COVID)  RVPGX2 - Abnormal; Notable for the following components:      Result Value   SARS Coronavirus 2 by RT PCR POSITIVE (*)    All other components within normal limits  CBC WITH DIFFERENTIAL/PLATELET - Abnormal; Notable for the following components:   RBC 3.68 (*)    Hemoglobin 11.3 (*)    HCT 32.2 (*)    Lymphs Abs 0.4 (*)    All other components within normal limits  COMPREHENSIVE METABOLIC PANEL - Abnormal; Notable for the following components:   Sodium 120 (*)    Chloride 86 (*)    Calcium  8.2 (*)    Total Protein 6.4 (*)    Albumin 3.3 (*)    All other components within normal limits  URINALYSIS, ROUTINE W REFLEX MICROSCOPIC     EKG  ED ECG REPORT I, Baylin Gamblin J, the attending physician, personally viewed and interpreted this ECG.   Date: 09/08/2023  EKG Time: 0724  Rate: 66  Rhythm: normal sinus rhythm  Axis: Normal  Intervals:none  ST&T Change: Nonspecific    RADIOLOGY I have independently visualized and interpreted patient's imaging study as well as noted the radiology interpretation:  Chest x-ray: No acute cardiopulmonary process  Official radiology report(s): DG Chest 2 View Result Date: 09/07/2023 CLINICAL DATA:  Cough. EXAM: CHEST - 2 VIEW COMPARISON:  04/13/2023. FINDINGS: Low lung volume. Bilateral lung fields are clear. Bilateral costophrenic angles are clear. Note is  made of elevated left hemidiaphragm. Stable cardio-mediastinal silhouette. No acute osseous abnormalities. The soft tissues are within normal limits. IMPRESSION: No active cardiopulmonary disease. Electronically Signed   By: Ree Molt M.D.   On: 09/07/2023 16:05     PROCEDURES:  Critical Care performed: No  .1-3 Lead EKG Interpretation  Performed by: Robinette Vermell PARAS, MD  Authorized by: Robinette Vermell PARAS, MD     Interpretation: normal     ECG rate:  75   ECG rate assessment: normal     Rhythm: sinus rhythm     Ectopy: none     Conduction: normal   Comments:     Patient placed on cardiac monitor to evaluate for arrhythmias    MEDICATIONS ORDERED IN ED: Medications  sodium chloride  0.9 % bolus 1,000 mL (has no administration in time range)  ondansetron  (ZOFRAN ) injection 4 mg (has no administration in time range)     IMPRESSION / MDM / ASSESSMENT AND PLAN / ED COURSE  I reviewed the triage vital signs and the nursing notes.                             78 year old female presenting with generalized weakness, cough and nausea.  Differential diagnosis includes but is not limited to ACS, infectious, metabolic etiologies, etc.  I personally reviewed patient's records and note a PCP office visit from 07/22/2023 for viral gastroenteritis.  Patient's presentation is most consistent with acute complicated illness / injury requiring diagnostic workup.  The patient is on the cardiac monitor to evaluate for evidence of arrhythmia and/or significant heart rate changes.  Laboratory results significant for hyponatremia with sodium 120 which is new from prior.  Patient is COVID-positive with normal chest x-ray, not tachypneic nor hypoxic.  Will check troponin, EKG and UA.  Initiate IV fluid resuscitation, IV Zofran  for nausea.  Will consult hospitalist services for evaluation and admission.      FINAL CLINICAL IMPRESSION(S) / ED DIAGNOSES   Final diagnoses:  Generalized weakness  Recurrent  falls  Nausea     Rx / DC Orders   ED Discharge Orders     None        Note:  This document was prepared using Dragon voice recognition software and may include unintentional dictation errors.   Amyia Lodwick J, MD 09/09/23 [phone]

## 2023-09-07 NOTE — ED Notes (Signed)
First nurse note: To home, [facility] for increasing weakness since 2 weeks. No unilateral wekness, no fever  100.5 temp, HR 91, 136/76, CBG 199, 95% RA  Pt has no complaints, just mild nonproductive cough.

## 2023-09-07 NOTE — ED Provider Triage Note (Signed)
 Emergency Medicine Provider Triage Evaluation Note  Brooke [name] , a 78 y.o. female  was evaluated in triage.  Pt here via EMS after daughter called them. Pt reports cough this week. No falls or diarrhea.  + nausea.  Pt is very poor historian and no family present.    Review of Systems  Positive: + cough, nausea Negative: No diarrhea.   Physical Exam  BP 126/84   Pulse 74   Temp 100 F (37.8 C) (Oral)   Resp 16   SpO2 95%  Gen:   Awake, no distress   poor historian Resp:  Normal effort   Lungs clear bilat.  MSK:   Moves extremities without difficulty  Other:    Medical Decision Making  Medically screening exam initiated at 2:41 PM.  Appropriate orders placed.  Brooke [name] was informed that the remainder of the evaluation will be completed by another provider, this initial triage assessment does not replace that evaluation, and the importance of remaining in the ED until their evaluation is complete.     Saunders Brooke CROME, PA-C 09/07/23 1444

## 2023-09-08 ENCOUNTER — Other Ambulatory Visit: Payer: Self-pay

## 2023-09-08 DIAGNOSIS — F419 Anxiety disorder, unspecified: Secondary | ICD-10-CM | POA: Diagnosis present

## 2023-09-08 DIAGNOSIS — F32A Depression, unspecified: Secondary | ICD-10-CM | POA: Diagnosis present

## 2023-09-08 DIAGNOSIS — E871 Hypo-osmolality and hyponatremia: Secondary | ICD-10-CM | POA: Diagnosis not present

## 2023-09-08 DIAGNOSIS — U071 COVID-19: Secondary | ICD-10-CM | POA: Diagnosis not present

## 2023-09-08 DIAGNOSIS — R531 Weakness: Secondary | ICD-10-CM | POA: Diagnosis not present

## 2023-09-08 DIAGNOSIS — R110 Nausea: Secondary | ICD-10-CM

## 2023-09-08 DIAGNOSIS — R296 Repeated falls: Secondary | ICD-10-CM

## 2023-09-08 DIAGNOSIS — Z888 Allergy status to other drugs, medicaments and biological substances status: Secondary | ICD-10-CM | POA: Diagnosis not present

## 2023-09-08 DIAGNOSIS — J449 Chronic obstructive pulmonary disease, unspecified: Secondary | ICD-10-CM

## 2023-09-08 DIAGNOSIS — Z8249 Family history of ischemic heart disease and other diseases of the circulatory system: Secondary | ICD-10-CM | POA: Diagnosis not present

## 2023-09-08 DIAGNOSIS — J4489 Other specified chronic obstructive pulmonary disease: Secondary | ICD-10-CM | POA: Diagnosis present

## 2023-09-08 DIAGNOSIS — E222 Syndrome of inappropriate secretion of antidiuretic hormone: Secondary | ICD-10-CM | POA: Diagnosis present

## 2023-09-08 DIAGNOSIS — Z800 Family history of malignant neoplasm of digestive organs: Secondary | ICD-10-CM | POA: Diagnosis not present

## 2023-09-08 DIAGNOSIS — Z882 Allergy status to sulfonamides status: Secondary | ICD-10-CM | POA: Diagnosis not present

## 2023-09-08 DIAGNOSIS — Z79899 Other long term (current) drug therapy: Secondary | ICD-10-CM | POA: Diagnosis not present

## 2023-09-08 DIAGNOSIS — I10 Essential (primary) hypertension: Secondary | ICD-10-CM | POA: Diagnosis present

## 2023-09-08 LAB — CBC
HCT: 31 % — ABNORMAL LOW (ref 36.0–46.0)
Hemoglobin: 10.5 g/dL — ABNORMAL LOW (ref 12.0–15.0)
MCH: 30.1 pg (ref 26.0–34.0)
MCHC: 33.9 g/dL (ref 30.0–36.0)
MCV: 88.8 fL (ref 80.0–100.0)
Platelets: 152 10*3/uL (ref 150–400)
RBC: 3.49 MIL/uL — ABNORMAL LOW (ref 3.87–5.11)
RDW: 12.7 % (ref 11.5–15.5)
WBC: 5.4 10*3/uL (ref 4.0–10.5)
nRBC: 0 % (ref 0.0–0.2)

## 2023-09-08 LAB — URINALYSIS, ROUTINE W REFLEX MICROSCOPIC
Bacteria, UA: NONE SEEN
Bilirubin Urine: NEGATIVE
Glucose, UA: NEGATIVE mg/dL
Ketones, ur: NEGATIVE mg/dL
Nitrite: NEGATIVE
Protein, ur: NEGATIVE mg/dL
Specific Gravity, Urine: 1.005 (ref 1.005–1.030)
pH: 6 (ref 5.0–8.0)

## 2023-09-08 LAB — BASIC METABOLIC PANEL
Anion gap: 9 (ref 5–15)
BUN: 10 mg/dL (ref 8–23)
CO2: 24 mmol/L (ref 22–32)
Calcium: 8.1 mg/dL — ABNORMAL LOW (ref 8.9–10.3)
Chloride: 93 mmol/L — ABNORMAL LOW (ref 98–111)
Creatinine, Ser: 0.61 mg/dL (ref 0.44–1.00)
GFR, Estimated: >60 mL/min (ref >60)
Glucose, Bld: 95 mg/dL (ref 70–99)
Potassium: 3.7 mmol/L (ref 3.5–5.1)
Sodium: 126 mmol/L — ABNORMAL LOW (ref 135–145)

## 2023-09-08 LAB — SODIUM
Sodium: 124 mmol/L — ABNORMAL LOW (ref 135–145)
Sodium: 128 mmol/L — ABNORMAL LOW (ref 135–145)
Sodium: 130 mmol/L — ABNORMAL LOW (ref 135–145)
Sodium: 133 mmol/L — ABNORMAL LOW (ref 135–145)
Sodium: 130 mmol/L — ABNORMAL LOW (ref 135–145)

## 2023-09-08 LAB — SODIUM, URINE, RANDOM: Sodium, Ur: 31 mmol/L

## 2023-09-08 LAB — OSMOLALITY, URINE: Osmolality, Ur: 212 mosm/kg — ABNORMAL LOW (ref 300–900)

## 2023-09-08 LAB — OSMOLALITY: Osmolality: 262 mosm/kg — ABNORMAL LOW (ref 275–295)

## 2023-09-08 LAB — TROPONIN I (HIGH SENSITIVITY): Troponin I (High Sensitivity): 9 ng/L (ref <18)

## 2023-09-08 MED ORDER — ONDANSETRON HCL 4 MG/2ML IJ SOLN
4.0000 mg | Freq: Four times a day (QID) | INTRAMUSCULAR | Status: DC | PRN
  Administered 2023-09-08 – 2023-09-09 (×3): 4 mg via INTRAVENOUS
  Filled 2023-09-08 (×4): qty 2

## 2023-09-08 MED ORDER — GABAPENTIN 100 MG PO CAPS
100.0000 mg | ORAL_CAPSULE | Freq: Two times a day (BID) | ORAL | Status: DC
  Administered 2023-09-08 – 2023-09-09 (×3): 100 mg via ORAL
  Filled 2023-09-08 (×3): qty 1

## 2023-09-08 MED ORDER — ALBUTEROL SULFATE HFA 108 (90 BASE) MCG/ACT IN AERS: 1.0000 | INHALATION_SPRAY | Freq: Four times a day (QID) | RESPIRATORY_TRACT | Status: DC | PRN

## 2023-09-08 MED ORDER — SODIUM CHLORIDE 0.9 % IV SOLN: INTRAVENOUS | Status: AC

## 2023-09-08 MED ORDER — ACETAMINOPHEN 325 MG PO TABS: 650.0000 mg | ORAL_TABLET | Freq: Four times a day (QID) | ORAL | Status: DC | PRN

## 2023-09-08 MED ORDER — ONDANSETRON HCL 4 MG PO TABS: 4.0000 mg | ORAL_TABLET | Freq: Four times a day (QID) | ORAL | Status: DC | PRN

## 2023-09-08 MED ORDER — BUTALBITAL-APAP-CAFFEINE 50-325-40 MG PO TABS
1.0000 | ORAL_TABLET | Freq: Once | ORAL | Status: AC
  Administered 2023-09-08: 1 via ORAL
  Filled 2023-09-08: qty 1

## 2023-09-08 MED ORDER — ENOXAPARIN SODIUM 40 MG/0.4ML IJ SOSY
40.0000 mg | PREFILLED_SYRINGE | INTRAMUSCULAR | Status: DC
  Administered 2023-09-08 – 2023-09-09 (×2): 40 mg via SUBCUTANEOUS
  Filled 2023-09-08 (×2): qty 0.4

## 2023-09-08 MED ORDER — CLONAZEPAM 0.5 MG PO TABS
0.5000 mg | ORAL_TABLET | Freq: Two times a day (BID) | ORAL | Status: DC | PRN
  Administered 2023-09-08 – 2023-09-09 (×2): 0.5 mg via ORAL
  Filled 2023-09-08 (×2): qty 1

## 2023-09-08 MED ORDER — FLUOXETINE HCL 20 MG PO CAPS
60.0000 mg | ORAL_CAPSULE | Freq: Every day | ORAL | Status: DC
  Administered 2023-09-08 – 2023-09-09 (×2): 60 mg via ORAL
  Filled 2023-09-08 (×2): qty 3

## 2023-09-08 MED ORDER — ACETAMINOPHEN 650 MG RE SUPP
650.0000 mg | Freq: Four times a day (QID) | RECTAL | Status: DC | PRN
Start: 2023-09-08 — End: 2023-09-09

## 2023-09-08 MED ORDER — PANTOPRAZOLE SODIUM 40 MG PO TBEC
40.0000 mg | DELAYED_RELEASE_TABLET | Freq: Every day | ORAL | Status: DC
  Administered 2023-09-08 – 2023-09-09 (×2): 40 mg via ORAL
  Filled 2023-09-08 (×2): qty 1

## 2023-09-08 NOTE — ED Notes (Addendum)
RN to bedside to answer call bell. Reports tv is not working, tv is currently on, remote noted to be working. Pt also requesting telephone but does not know the numbers or who is trying to call.

## 2023-09-08 NOTE — H&P (Signed)
 History and Physical    Patient: Brooke [name] FMW:[id number] DOB: [birth year] DOA: 09/07/2023 DOS: the patient was seen and examined on 09/08/2023 PCP: Herold Hadassah SQUIBB, MD  Patient coming from: Home  Chief Complaint:  Chief Complaint  Patient presents with   Cough   Nausea    HPI: Brooke [name] is a 78 y.o. female with medical history significant for COPD, hypertension, depression anxiety, who presents to the ED with 2 weeks of weakness and lethargy leading to falls, nausea and nonproductive cough.  Denies chest pain, shortness of breath, vomiting or diarrhea abdominal pain or dysuria ED course: Tmax 100.2 with otherwise normal vitals Workup notable for the following: COVID-positive Sodium 120 WBC normal, hemoglobin 11.3 (baseline 12) Chest x-ray nonacute  Patient treated with a NS bolus Hospitalist consulted for admission for hyponatremia in the setting of COVID infection     Past Medical History:  Diagnosis Date   Asthma    Past Surgical History:  Procedure Laterality Date   PARTIAL HYSTERECTOMY     TEMPORAL ARTERY BIOPSY / LIGATION     Social History:  reports that she has never smoked. She has never used smokeless tobacco. No history on file for alcohol use and drug use.  Allergies  Allergen Reactions   Nystatin  Swelling   Sulfa Antibiotics Rash    Family History  Problem Relation Age of Onset   CAD Father    Colon cancer Father     Prior to Admission medications   Medication Sig Start Date End Date Taking? Authorizing Provider  albuterol  (VENTOLIN  HFA) 108 (90 Base) MCG/ACT inhaler Inhale 1-2 puffs into the lungs every 6 (six) hours as needed for wheezing or shortness of breath. 09/07/23   Herold Hadassah SQUIBB, MD  butalbital -aspirin -caffeine  (FIORINAL ) 50-325-40 MG capsule Take 1 capsule by mouth 2 (two) times daily as needed for headache or migraine. 09/07/23 11/06/23  Herold Hadassah SQUIBB, MD  clonazePAM  (KLONOPIN ) 0.5 MG tablet Take 1 tablet (0.5 mg total) by mouth 2  (two) times daily as needed for anxiety. 09/07/23 12/06/23  Herold Hadassah SQUIBB, MD  FLUoxetine  (PROZAC ) 20 MG capsule Take 2 capsules (40 mg total) by mouth 2 (two) times daily. 05/12/23 07/22/23  Herold Hadassah SQUIBB, MD  gabapentin  (NEURONTIN ) 100 MG capsule Take 1 capsule (100 mg total) by mouth 2 (two) times daily. 06/02/23   Herold Hadassah SQUIBB, MD  ipratropium (ATROVENT ) 0.06 % nasal spray Place 1 spray into both nostrils 3 (three) times daily as needed for rhinitis. 09/07/23   Herold Hadassah SQUIBB, MD  loperamide  (IMODIUM  A-D) 2 MG tablet Take 1 tablet (2 mg total) by mouth 4 (four) times daily as needed for diarrhea or loose stools. 08/24/23   Herold Hadassah SQUIBB, MD  losartan  (COZAAR ) 25 MG tablet Take 50 mg by mouth daily. 03/13/16   [provider]  omeprazole  (PRILOSEC) 20 MG capsule Take 1 capsule (20 mg total) by mouth 2 (two) times daily before a meal. 09/07/23   Herold Hadassah SQUIBB, MD  ondansetron  (ZOFRAN ) 4 MG tablet Take 1 tablet (4 mg total) by mouth every 8 (eight) hours as needed for nausea or vomiting. 09/07/23   Herold Hadassah SQUIBB, MD  traZODone  (DESYREL ) 100 MG tablet Take 1 tablet (100 mg total) by mouth at bedtime. 09/07/23   Herold Hadassah SQUIBB, MD    Physical Exam: Vitals:   09/07/23 1438 09/07/23 1848 09/08/23 0015  BP: 126/84 101/86 (!) 106/90  Pulse: 74 77 70  Resp: 16 18 16  Temp: 100 F (37.8 C) 100.2 F (37.9 C) 97.8 F (36.6 C)  TempSrc: Oral Oral Oral  SpO2: 95% 96% 96%   Physical Exam Vitals and nursing note reviewed.  Constitutional:      General: She is not in acute distress. HENT:     Head: Normocephalic and atraumatic.  Cardiovascular:     Rate and Rhythm: Normal rate and regular rhythm.     Heart sounds: Normal heart sounds.  Pulmonary:     Effort: Pulmonary effort is normal.     Breath sounds: Normal breath sounds.  Abdominal:     Palpations: Abdomen is soft.     Tenderness: There is no abdominal tenderness.  Neurological:     Mental Status: Mental status is at baseline.      Labs on Admission: I have personally reviewed following labs and imaging studies  CBC: Recent Labs  Lab 09/07/23 1443  WBC 6.9  NEUTROABS 5.5  HGB 11.3*  HCT 32.2*  MCV 87.5  PLT 184   Basic Metabolic Panel: Recent Labs  Lab 09/07/23 1443  NA 120*  K 3.7  CL 86*  CO2 24  GLUCOSE 89  BUN 11  CREATININE 0.65  CALCIUM  8.2*   GFR: CrCl cannot be calculated (Unknown ideal weight.). Liver Function Tests: Recent Labs  Lab 09/07/23 1443  AST 24  ALT 16  ALKPHOS 54  BILITOT 0.5  PROT 6.4*  ALBUMIN 3.3*   No results for input(s): LIPASE, AMYLASE in the last 168 hours. No results for input(s): AMMONIA in the last 168 hours. Coagulation Profile: No results for input(s): INR, PROTIME in the last 168 hours. Cardiac Enzymes: No results for input(s): CKTOTAL, CKMB, CKMBINDEX, TROPONINI in the last 168 hours. BNP (last 3 results) No results for input(s): PROBNP in the last 8760 hours. HbA1C: No results for input(s): HGBA1C in the last 72 hours. CBG: No results for input(s): GLUCAP in the last 168 hours. Lipid Profile: No results for input(s): CHOL, HDL, LDLCALC, TRIG, CHOLHDL, LDLDIRECT in the last 72 hours. Thyroid  Function Tests: No results for input(s): TSH, T4TOTAL, FREET4, T3FREE, THYROIDAB in the last 72 hours. Anemia Panel: No results for input(s): VITAMINB12, FOLATE, FERRITIN, TIBC, IRON , RETICCTPCT in the last 72 hours. Urine analysis:    Component Value Date/Time   COLORURINE YELLOW (A) 05/11/2016 0156   APPEARANCEUR CLEAR (A) 05/11/2016 0156   LABSPEC 1.011 05/11/2016 0156   PHURINE 7.0 05/11/2016 0156   GLUCOSEU NEGATIVE 05/11/2016 0156   HGBUR NEGATIVE 05/11/2016 0156   BILIRUBINUR NEGATIVE 05/11/2016 0156   KETONESUR NEGATIVE 05/11/2016 0156   PROTEINUR NEGATIVE 05/11/2016 0156   NITRITE NEGATIVE 05/11/2016 0156   LEUKOCYTESUR NEGATIVE 05/11/2016 0156    Radiological Exams on  Admission: DG Chest 2 View Result Date: 09/07/2023 CLINICAL DATA:  Cough. EXAM: CHEST - 2 VIEW COMPARISON:  04/13/2023. FINDINGS: Low lung volume. Bilateral lung fields are clear. Bilateral costophrenic angles are clear. Note is made of elevated left hemidiaphragm. Stable cardio-mediastinal silhouette. No acute osseous abnormalities. The soft tissues are within normal limits. IMPRESSION: No active cardiopulmonary disease. Electronically Signed   By: Ree Molt M.D.   On: 09/07/2023 16:05     Data Reviewed: Relevant notes from primary care and specialist visits, past discharge summaries as available in EHR, including Care Everywhere. Prior diagnostic testing as pertinent to current admission diagnoses Updated medications and problem lists for reconciliation ED course, including vitals, labs, imaging, treatment and response to treatment Triage notes, nursing and pharmacy notes and ED  provider's notes Notable results as noted in HPI   Assessment and Plan: * Hyponatremia, symptomatic Etiology uncertain, acuity uncertain, possibly hypovolemic in the setting of nausea with poor oral intake, possible SIADH related to psychotropic meds Urine and serum osmolality and urine sodium Received an NS bolus in the ED Will continue NS Frequent sodium checks Correct sodium with goal 8 mEq daily  COVID-19 virus infection Symptomatic for cough generalized weakness, and had a low-grade temp of 100.2 and in the ED No antivirals given symptom onset over a week prior Supportive care Symptom control, antitussives, antipyretics, albuterol  as needed shortness of breath Airborne precautions  Generalized weakness Falls  Hoping for improvement with treatment of the above PT eval  Anxiety Continue home meds fluoxetine  and Klonopin  as well as trazodone  nightly Monitor for worsening of lethargy with Klonopin  and trazodone   COPD (chronic obstructive pulmonary disease) (HCC) Not acutely  exacerbated Albuterol  as needed     DVT prophylaxis: Lovenox   Consults: none  Advance Care Planning:   Code Status: Prior   Family Communication: none  Disposition Plan: Back to previous home environment  Severity of Illness: The appropriate patient status for this patient is INPATIENT. Inpatient status is judged to be reasonable and necessary in order to provide the required intensity of service to ensure the patient's safety. The patient's presenting symptoms, physical exam findings, and initial radiographic and laboratory data in the context of their chronic comorbidities is felt to place them at high risk for further clinical deterioration. Furthermore, it is not anticipated that the patient will be medically stable for discharge from the hospital within 2 midnights of admission.   * I certify that at the point of admission it is my clinical judgment that the patient will require inpatient hospital care spanning beyond 2 midnights from the point of admission due to high intensity of service, high risk for further deterioration and high frequency of surveillance required.*  Author: Delayne LULLA Solian, MD 09/08/2023 12:20 AM  For on call review [email or web address].

## 2023-09-08 NOTE — ED Notes (Signed)
 This RN to bedside to answer call bell. Pt reports "I need help, but unable to state needs". Pt answers yes to needing to toilet, purewick placed d/t pt HR 150s with ambulation and pt's frequent need to urinate.

## 2023-09-08 NOTE — ED Notes (Signed)
 Patient ringing the call bell back to back and multiple times as soon as staff exits room with non-emergent complaints or needs. The patient was educated by this nurse, other staff, and charge RN on the proper use of call bell, that she is still in the ER and staff can not come in every 5 minutes for non emergent complaints. The patient states she doesn't care what we say that she will hit the button whenever she feels like.

## 2023-09-08 NOTE — ED Notes (Signed)
 RN to bedside to answer call bell. Placed on bed pan as requested. Monitors placed back on pt

## 2023-09-08 NOTE — ED Notes (Signed)
 Called to floor for rec RN and the need of handoff completion. Per unit sec the patient cannot come to 239 r/t covid and double room so the patient will have to go to another room.

## 2023-09-08 NOTE — Progress Notes (Signed)
  Progress Note   Patient: Brooke [name] FMW:[id number] DOB: [birth year] DOA: 09/07/2023     0 DOS: the patient was seen and examined on 09/08/2023   Brief hospital course: Taken from H&P.  MCKENZIE BOVE is a 78 y.o. female with medical history significant for COPD, hypertension, depression anxiety, who presents to the ED with 2 weeks of weakness and lethargy leading to falls, nausea and nonproductive cough.   On presentation febrile at 100.2, labs pertinent for positive COVID PCR, sodium 120, no leukocytosis.  Chest x-ray without any acute abnormality.  Patient received normal saline bolus and admitted for hyponatremia in the setting of COVID infection.  2/5: Low-grade fever, sodium improved to 126, low urine osmolality at 212 and urine sodium of 31, low serum osmolality at 262.  Patient presented with hypotonic hyponatremia likely with poor p.o. intake, will continue with normal saline.  Pending PT evaluation.    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  Hyponatremia labs with hypotonic hyponatremia Will continue NS Frequent sodium checks Correct sodium with goal 10 mEq daily  COVID-19 virus infection Symptomatic for cough generalized weakness, and had a low-grade temp of 100.2 and in the ED No antivirals given symptom onset over a week prior Supportive care Symptom control, antitussives, antipyretics, albuterol  as needed shortness of breath Airborne precautions  Generalized weakness Falls  Hoping for improvement with treatment of the above PT eval  Anxiety Continue home meds fluoxetine  and Klonopin  as well as trazodone  nightly Monitor for worsening of lethargy with Klonopin  and trazodone   COPD (chronic obstructive pulmonary disease) (HCC) Not acutely exacerbated Albuterol  as needed   Subjective: Patient was seen  and examined today.  No significant upper respiratory symptoms.  Continue to feel weak with decreased appetite and some nausea.  Physical Exam: Vitals:   09/08/23 0545 09/08/23 0730 09/08/23 0900 09/08/23 1035  BP:  (!) 131/91  112/88  Pulse: 89  72 72  Resp: (!) 21 (!) 22  20  Temp:    99.5 F (37.5 C)  TempSrc:      SpO2: 96%  100% 97%   General.  Frail elderly lady, in no acute distress. Pulmonary.  Lungs clear bilaterally, normal respiratory effort. CV.  Regular rate and rhythm, no JVD, rub or murmur. Abdomen.  Soft, nontender, nondistended, BS positive. CNS.  Alert and oriented .  No focal neurologic deficit. Extremities.  No edema, no cyanosis, pulses intact and symmetrical.   Data Reviewed: Prior data reviewed  Family Communication: Talked with daughter on phone.  Disposition: Status is: Inpatient Remains inpatient appropriate because: Severity of illness  Planned Discharge Destination: Home with Home Health  DVT prophylaxis.  Lovenox  Time spent:  minutes  This record has been created using Conservation officer, historic buildings. Errors have been sought and corrected,but may not always be located. Such creation errors do not reflect on the standard of care.   Author: Amaryllis Dare, MD 09/08/2023 1:26 PM  For on call review [email or web address].

## 2023-09-08 NOTE — Evaluation (Signed)
 Physical Therapy Evaluation Patient Details Name: Brooke [name] MRN: [id number] DOB: 05/02/1946 Today's Date: 09/08/2023  History of Present Illness  78 y/o female presented to ED on 09/07/23 for 2 weeks of weakness and lethargy leading to falls along with nausea and nonproductive cough. Tested positive for COVID-19. PMH: COPD, HTN, depression, anxiety  Clinical Impression  Patient admitted with the above. PTA, patient lives with husband and reports independence with use of cane for mobility most of the time. Patient presents with weakness, impaired balance, decreased activity tolerance, and impulsivity. Required CGA for safety for sit to stand and step pivot transfer with no AD. HR jumps to 147 with minimal activity. Patient will benefit from skilled PT services during acute stay to address listed deficits. Patient will benefit from ongoing therapy at discharge to maximize functional independence and safety.         If plan is discharge home, recommend the following: A little help with walking and/or transfers;A little help with bathing/dressing/bathroom;Assistance with cooking/housework;Assist for transportation;Help with stairs or ramp for entrance   Can travel by private vehicle        Equipment Recommendations Rolling [name] (2 wheels)  Recommendations for Other Services       Functional Status Assessment Patient has had a recent decline in their functional status and demonstrates the ability to make significant improvements in function in a reasonable and predictable amount of time.     Precautions / Restrictions Precautions Precautions: Fall Precaution Comments: watch HR Restrictions Weight Bearing Restrictions Per Provider Order: No      Mobility  Bed Mobility Overal bed mobility: Modified Independent                  Transfers Overall transfer level: Needs assistance Equipment used: None Transfers: Sit to/from Stand, Bed to chair/wheelchair/BSC Sit to Stand:  Contact guard assist   Step pivot transfers: Contact guard assist       General transfer comment: CGA for safety from elevated stretcher. Able to transfer to/from [facility] and stretcher x 2 with mild balance deficits. Possibly due to impulsivity. HR jumps to 147 with minimal activity    Ambulation/Gait               General Gait Details: limited to transfer due to IV line not being portable  Stairs            Wheelchair Mobility     Tilt Bed    Modified Rankin (Stroke Patients Only)       Balance Overall balance assessment: Mild deficits observed, not formally tested                                           Pertinent Vitals/Pain Pain Assessment Pain Assessment: No/denies pain    Home Living Family/patient expects to be discharged to:: Private residence Living Arrangements: Spouse/significant other Available Help at Discharge: Family Type of Home: House Home Access: Stairs to enter Entrance Stairs-Rails: None Entrance Stairs-Number of Steps: 2-3   Home Layout: One level Home Equipment: Cane - single point      Prior Function Prior Level of Function : Independent/Modified Independent             Mobility Comments: states she cane most of the time       Extremity/Trunk Assessment   Upper Extremity Assessment Upper Extremity Assessment: Generalized weakness    Lower Extremity  Assessment Lower Extremity Assessment: Generalized weakness    Cervical / Trunk Assessment Cervical / Trunk Assessment: Kyphotic  Communication   Communication Communication: No apparent difficulties  Cognition Arousal: Alert Behavior During Therapy: Impulsive Overall Cognitive Status: No family/caregiver present to determine baseline cognitive functioning                                 General Comments: A&Ox4. Poor safety awareness and impulsive        General Comments General comments (skin integrity, edema, etc.): HR jumps  to 147 with minimal activity    Exercises     Assessment/Plan    PT Assessment Patient needs continued PT services  PT Problem List Decreased strength;Decreased activity tolerance;Decreased balance;Decreased mobility;Decreased safety awareness;Cardiopulmonary status limiting activity       PT Treatment Interventions DME instruction;Gait training;Functional mobility training;Therapeutic activities;Therapeutic exercise;Balance training;Stair training;Patient/family education    PT Goals (Current goals can be found in the Care Plan section)  Acute Rehab PT Goals Patient Stated Goal: to go home PT Goal Formulation: With patient Time For Goal Achievement: 09/22/23 Potential to Achieve Goals: Good    Frequency Min 1X/week     Co-evaluation               AM-PAC PT 6 Clicks Mobility  Outcome Measure Help needed turning from your back to your side while in a flat bed without using bedrails?: None Help needed moving from lying on your back to sitting on the side of a flat bed without using bedrails?: None Help needed moving to and from a bed to a chair (including a wheelchair)?: A Little Help needed standing up from a chair using your arms (e.g., wheelchair or bedside chair)?: A Little Help needed to walk in hospital room?: A Little Help needed climbing 3-5 steps with a railing? : A Little 6 Click Score: 20    End of Session   Activity Tolerance: Patient tolerated treatment well Patient left: in bed;with call bell/phone within reach Nurse Communication: Mobility status PT Visit Diagnosis: Unsteadiness on feet (R26.81);Muscle weakness (generalized) (M62.81);Difficulty in walking, not elsewhere classified (R26.2)    Time: 8663-8644 PT Time Calculation (min) (ACUTE ONLY): 19 min   Charges:   PT Evaluation $PT Eval Moderate Complexity: 1 Mod   PT General Charges $$ ACUTE PT VISIT: 1 Visit         Maryanne Finder, PT, DPT Physical Therapist - [facility]  [facility]   Chao Blazejewski A Philip Kotlyar 09/08/2023, 2:18 PM

## 2023-09-08 NOTE — ED Notes (Signed)
 This RN to bedside to answer call bell. Pt reports needing to be straightened out. Discussed with pt that this RN was just in room and helped pt with comfort measures. Pt pulling at lines, educated that monitors and IV needs to stay in place.  This RN confirmed with pt x2 that no other needs to be addressed at this time.

## 2023-09-08 NOTE — ED Notes (Addendum)
 This RN to bedside to answer call bell with Consulting civil engineer. Pt reports needs to get up and needs morning meds, informed pt not safe to continue ambulating d/t increase in HR. Informed pt will administer meds when able.  Pt denies additional complaints.

## 2023-09-08 NOTE — ED Notes (Signed)
 Pt calling out again, placed on bed pan

## 2023-09-08 NOTE — ED Notes (Signed)
 RN to bedside for call bell. Pt found at end of bed trying to get up and pulling monitors off. Pt repositioned. Brief and chux changed. Bed alarm in place. Lunch at bedside.

## 2023-09-08 NOTE — ED Notes (Signed)
 This RN to bedside to answer call bell. States needs medications, informed pt that this RN will administer medications when able. Pt also taken off monitors again.

## 2023-09-08 NOTE — Telephone Encounter (Signed)
 Entered in error

## 2023-09-08 NOTE — ED Notes (Signed)
 This RN to bedside to answer call bell. Pt states cannot remember why she called.

## 2023-09-08 NOTE — Assessment & Plan Note (Signed)
Not acutely exacerbated.  Albuterol as needed 

## 2023-09-08 NOTE — ED Notes (Signed)
 RN to bedside to answer call bell. Pt had been given phone to make calls, pt reports she is unable to remember number she wanted to call /

## 2023-09-08 NOTE — ED Notes (Signed)
 RN to bedside to answer call bell, states needs to urinate, assisted on bed pan with no output

## 2023-09-08 NOTE — ED Notes (Signed)
 Pt continuing to take off monitors after being educated against this.

## 2023-09-08 NOTE — ED Notes (Signed)
 Pt visualized resting at this time. RR even and unlabored. Will obtain full set of VS when awake

## 2023-09-08 NOTE — ED Notes (Signed)
Patient provided the bedpan x2 occasions. Will continue to monitor.

## 2023-09-08 NOTE — ED Notes (Addendum)
 This RN to bedside to answer call bell, assisted pt to restroom.

## 2023-09-08 NOTE — ED Notes (Signed)
RN to bedside to answer call bell. Assisted with bed pan. Urine output noted.

## 2023-09-08 NOTE — ED Notes (Signed)
 RN to bedside to answer call bell, states needs to urinate. Assisted on bed pan with no output

## 2023-09-08 NOTE — Assessment & Plan Note (Addendum)
 Continue home meds fluoxetine  and Klonopin  as well as trazodone  nightly Monitor for worsening of lethargy with Klonopin  and trazodone 

## 2023-09-08 NOTE — Assessment & Plan Note (Addendum)
 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  Hyponatremia labs with hypotonic hyponatremia Will continue NS Frequent sodium checks Correct sodium with goal 10 mEq daily

## 2023-09-08 NOTE — ED Notes (Signed)
 Pt continuously on the call bell, Charge RN into room for second time since 7am, pt expressed that she needed meds, explained that RN had just given ordered meds, explained to patient that hourly rounding will be done but other patients require attention also.

## 2023-09-08 NOTE — Assessment & Plan Note (Signed)
 Symptomatic for cough generalized weakness, and had a low-grade temp of 100.2 and in the ED No antivirals given symptom onset over a week prior Supportive care Symptom control, antitussives, antipyretics, albuterol  as needed shortness of breath Airborne precautions

## 2023-09-08 NOTE — ED Notes (Signed)
 RN to bedside to answer call bell. Placed on bed pan as requested, clean brief changed d/t current one being "uncomfortable"

## 2023-09-08 NOTE — ED Notes (Signed)
 TRANSPORT PAGED FOR PT TRANSFER

## 2023-09-08 NOTE — ED Notes (Signed)
 Pt visualized resting, RR even and unlabored. Call bell in reach

## 2023-09-08 NOTE — ED Notes (Signed)
 Logan RN to bedside to answer call bell

## 2023-09-08 NOTE — Assessment & Plan Note (Addendum)
 Falls  Hoping for improvement with treatment of the above PT eval

## 2023-09-08 NOTE — ED Notes (Signed)
 This RN to bedside to answer call bell, pt states "I just need to be straighten up".

## 2023-09-08 NOTE — Hospital Course (Addendum)
 Taken from H&P.  Brooke [name] is a 78 y.o. female with medical history significant for COPD, hypertension, depression anxiety, who presents to the ED with 2 weeks of weakness and lethargy leading to falls, nausea and nonproductive cough.   On presentation febrile at 100.2, labs pertinent for positive COVID PCR, sodium 120, no leukocytosis.  Chest x-ray without any acute abnormality.  Patient received normal saline bolus and admitted for hyponatremia in the setting of COVID infection.  2/5: Low-grade fever, sodium improved to 126, low urine osmolality at 212 and urine sodium of 31, low serum osmolality at 262.  Patient presented with hypotonic hyponatremia likely with poor p.o. intake, will continue with normal saline.   2/6: Hemodynamically stable, sodium improved to 130.  PT is recommending home health which was ordered.  Patient continued to have mild nausea without any active vomiting which seems chronic.  Patient uses Zofran  at home and stating that at times it helps and sometimes it does not.  She was also given some Phenergan  to use if Zofran  is not working. Patient remained asymptomatic from COVID standpoint and is being discharged on her home medications.  She was also instructed to add some salt to her diet as her depression medications can be contributory.  Patient need to discuss with her primary care provider to make changes if needed.  Patient will continue on her home medications and need to have a close follow-up with her providers for further management.

## 2023-09-08 NOTE — ED Notes (Signed)
 Pt up to restroom w/ one assist. Pt back in bed and provided w/ sprite per pt request.   Float rn

## 2023-09-08 NOTE — ED Notes (Signed)
 Patient continuing to call out screaming at staff to come in her room. When staff enters she points to her monitor wires stating they are tangled. The patient pulls and removes her monitor. Patient instructed to keep the monitor in place and to call for emergencies.

## 2023-09-09 DIAGNOSIS — F419 Anxiety disorder, unspecified: Secondary | ICD-10-CM | POA: Diagnosis not present

## 2023-09-09 DIAGNOSIS — E871 Hypo-osmolality and hyponatremia: Secondary | ICD-10-CM | POA: Diagnosis not present

## 2023-09-09 DIAGNOSIS — U071 COVID-19: Secondary | ICD-10-CM | POA: Diagnosis not present

## 2023-09-09 DIAGNOSIS — R531 Weakness: Secondary | ICD-10-CM | POA: Diagnosis not present

## 2023-09-09 LAB — SODIUM
Sodium: 129 mmol/L — ABNORMAL LOW (ref 135–145)
Sodium: 130 mmol/L — ABNORMAL LOW (ref 135–145)
Sodium: 129 mmol/L — ABNORMAL LOW (ref 135–145)

## 2023-09-09 MED ORDER — PROMETHAZINE HCL 25 MG PO TABS: 12.5000 mg | ORAL_TABLET | Freq: Four times a day (QID) | ORAL | Status: DC | PRN

## 2023-09-09 MED ORDER — PROMETHAZINE HCL 12.5 MG PO TABS: 12.5000 mg | ORAL_TABLET | Freq: Four times a day (QID) | ORAL | 0 refills | Status: DC | PRN

## 2023-09-09 NOTE — Plan of Care (Signed)

## 2023-09-09 NOTE — Discharge Summary (Signed)
 Physician Discharge Summary   Patient: Brooke [name] MRN: [id number] DOB: January 25, 1946  Admit date:     09/07/2023  Discharge date: 09/09/23  Discharge Physician: Amaryllis Dare   PCP: Herold Hadassah SQUIBB, MD   Recommendations at discharge:  Please obtain CBC and BMP on follow-up Patient's psych medications can be contributory to her hyponatremia, please encourage p.o. hydration and adding some salt to diet. Follow-up with primary care provider within a week  Discharge Diagnoses: Principal Problem:   Hyponatremia, symptomatic Active Problems:   Generalized weakness   COVID-19 virus infection   COPD (chronic obstructive pulmonary disease) (HCC)   Anxiety   Nausea   Recurrent falls   Hospital Course: Taken from H&P.  Brooke [name] is a 78 y.o. female with medical history significant for COPD, hypertension, depression anxiety, who presents to the ED with 2 weeks of weakness and lethargy leading to falls, nausea and nonproductive cough.   On presentation febrile at 100.2, labs pertinent for positive COVID PCR, sodium 120, no leukocytosis.  Chest x-ray without any acute abnormality.  Patient received normal saline bolus and admitted for hyponatremia in the setting of COVID infection.  2/5: Low-grade fever, sodium improved to 126, low urine osmolality at 212 and urine sodium of 31, low serum osmolality at 262.  Patient presented with hypotonic hyponatremia likely with poor p.o. intake, will continue with normal saline.   2/6: Hemodynamically stable, sodium improved to 130.  PT is recommending home health which was ordered.  Patient continued to have mild nausea without any active vomiting which seems chronic.  Patient uses Zofran  at home and stating that at times it helps and sometimes it does not.  She was also given some Phenergan  to use if Zofran  is not working. Patient remained asymptomatic from COVID standpoint and is being discharged on her home medications.  She was also instructed to  add some salt to her diet as her depression medications can be contributory.  Patient need to discuss with her primary care provider to make changes if needed.  Patient will continue on her home medications and need to have a close follow-up with her providers for further management.    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  Hyponatremia labs with hypotonic hyponatremia Sodium improved to 130 today  COVID-19 virus infection Symptomatic for cough generalized weakness, and had a low-grade temp of 100.2 and in the ED No antivirals given symptom onset over a week prior Supportive care Symptom control, antitussives, antipyretics, albuterol  as needed shortness of breath Airborne precautions  Generalized weakness Falls  Hoping for improvement with treatment of the above PT eval-recommending home health services which were ordered  Anxiety Continue home meds fluoxetine  and Klonopin  as well as trazodone  nightly Monitor for worsening of lethargy with Klonopin  and trazodone   COPD (chronic obstructive pulmonary disease) (HCC) Not acutely exacerbated Albuterol  as needed  Consultants: None Procedures performed: None Disposition: Home health Diet recommendation:  Discharge Diet Orders (From admission, onward)     Start     Ordered   09/09/23 0000  Diet - low sodium heart healthy        09/09/23 1042           Regular diet DISCHARGE MEDICATION: Allergies as of 09/09/2023       Reactions   Nystatin  Swelling   Sulfa Antibiotics Rash  Medication List     TAKE these medications    albuterol  108 (90 Base) MCG/ACT inhaler Commonly known as: VENTOLIN  HFA Inhale 1-2 puffs into the lungs every 6 (six) hours as needed for wheezing or shortness of breath.   butalbital -aspirin -caffeine  50-325-40 MG  capsule Commonly known as: FIORINAL  Take 1 capsule by mouth 2 (two) times daily as needed for headache or migraine.   clonazePAM  0.5 MG tablet Commonly known as: KLONOPIN  Take 1 tablet (0.5 mg total) by mouth 2 (two) times daily as needed for anxiety.   FLUoxetine  20 MG capsule Commonly known as: PROzac  Take 2 capsules (40 mg total) by mouth 2 (two) times daily.   gabapentin  100 MG capsule Commonly known as: NEURONTIN  Take 1 capsule (100 mg total) by mouth 2 (two) times daily.   ipratropium 0.06 % nasal spray Commonly known as: ATROVENT  Place 1 spray into both nostrils 3 (three) times daily as needed for rhinitis.   loperamide  2 MG tablet Commonly known as: Imodium  A-D Take 1 tablet (2 mg total) by mouth 4 (four) times daily as needed for diarrhea or loose stools.   losartan  25 MG tablet Commonly known as: COZAAR  Take 25 mg by mouth in the morning and at bedtime.   omeprazole  20 MG capsule Commonly known as: PRILOSEC Take 1 capsule (20 mg total) by mouth 2 (two) times daily before a meal.   ondansetron  4 MG tablet Commonly known as: Zofran  Take 1 tablet (4 mg total) by mouth every 8 (eight) hours as needed for nausea or vomiting.   promethazine  12.5 MG tablet Commonly known as: PHENERGAN  Take 1 tablet (12.5 mg total) by mouth every 6 (six) hours as needed for nausea or vomiting (If Zofran  is not working).   traZODone  100 MG tablet Commonly known as: DESYREL  Take 1 tablet (100 mg total) by mouth at bedtime.        Discharge Exam: There were no vitals filed for this visit. General.  Frail elderly lady, in no acute distress. Pulmonary.  Lungs clear bilaterally, normal respiratory effort. CV.  Regular rate and rhythm, no JVD, rub or murmur. Abdomen.  Soft, nontender, nondistended, BS positive. CNS.  Alert and oriented .  No focal neurologic deficit. Extremities.  No edema, no cyanosis, pulses intact and symmetrical.  Condition at discharge: stable  The results  of significant diagnostics from this hospitalization (including imaging, microbiology, ancillary and laboratory) are listed below for reference.   Imaging Studies: DG Chest 2 View Result Date: 09/07/2023 CLINICAL DATA:  Cough. EXAM: CHEST - 2 VIEW COMPARISON:  04/13/2023. FINDINGS: Low lung volume. Bilateral lung fields are clear. Bilateral costophrenic angles are clear. Note is made of elevated left hemidiaphragm. Stable cardio-mediastinal silhouette. No acute osseous abnormalities. The soft tissues are within normal limits. IMPRESSION: No active cardiopulmonary disease. Electronically Signed   By: Ree Molt M.D.   On: 09/07/2023 16:05    Microbiology: Results for orders placed or performed during the hospital encounter of 09/07/23  Resp panel by RT-PCR (RSV, Flu A&B, Covid) Anterior Nasal Swab     Status: Abnormal   Collection Time: 09/07/23  2:43 PM   Specimen: Anterior Nasal Swab  Result Value Ref Range Status   SARS Coronavirus 2 by RT PCR POSITIVE (A) NEGATIVE Final    Comment: (NOTE) SARS-CoV-2 target nucleic acids are DETECTED.  The SARS-CoV-2 RNA is generally detectable in upper respiratory specimens during the acute phase of infection. Positive results are indicative of the presence of the identified virus,  but do not rule out bacterial infection or co-infection with other pathogens not detected by the test. Clinical correlation with patient history and other diagnostic information is necessary to determine patient infection status. The expected result is Negative.  Fact Sheet for Patients: bloggercourse.com  Fact Sheet for Healthcare Providers: seriousbroker.it  This test is not yet approved or cleared by the [place]  FDA and  has been authorized for detection and/or diagnosis of SARS-CoV-2 by FDA under an Emergency Use Authorization (EUA).  This EUA will remain in effect (meaning this test can be used) for the  duration of  the COVID-19 declaration under Section 564(b)(1) of the A ct, 21 U.S.C. section 360bbb-3(b)(1), unless the authorization is terminated or revoked sooner.     Influenza A by PCR NEGATIVE NEGATIVE Final   Influenza B by PCR NEGATIVE NEGATIVE Final    Comment: (NOTE) The Xpert Xpress SARS-CoV-2/FLU/RSV plus assay is intended as an aid in the diagnosis of influenza from Nasopharyngeal swab specimens and should not be used as a sole basis for treatment. Nasal washings and aspirates are unacceptable for Xpert Xpress SARS-CoV-2/FLU/RSV testing.  Fact Sheet for Patients: bloggercourse.com  Fact Sheet for Healthcare Providers: seriousbroker.it  This test is not yet approved or cleared by the [place]  FDA and has been authorized for detection and/or diagnosis of SARS-CoV-2 by FDA under an Emergency Use Authorization (EUA). This EUA will remain in effect (meaning this test can be used) for the duration of the COVID-19 declaration under Section 564(b)(1) of the Act, 21 U.S.C. section 360bbb-3(b)(1), unless the authorization is terminated or revoked.     Resp Syncytial Virus by PCR NEGATIVE NEGATIVE Final    Comment: (NOTE) Fact Sheet for Patients: bloggercourse.com  Fact Sheet for Healthcare Providers: seriousbroker.it  This test is not yet approved or cleared by the [place]  FDA and has been authorized for detection and/or diagnosis of SARS-CoV-2 by FDA under an Emergency Use Authorization (EUA). This EUA will remain in effect (meaning this test can be used) for the duration of the COVID-19 declaration under Section 564(b)(1) of the Act, 21 U.S.C. section 360bbb-3(b)(1), unless the authorization is terminated or revoked.  Performed at [facility], [address] [place], [place] [address]     Labs: CBC: Recent Labs  Lab 09/07/23 1443  09/08/23 0445  WBC 6.9 5.4  NEUTROABS 5.5  --   HGB 11.3* 10.5*  HCT 32.2* 31.0*  MCV 87.5 88.8  PLT 184 152   Basic Metabolic Panel: Recent Labs  Lab 09/07/23 1443 09/08/23 0143 09/08/23 0445 09/08/23 1037 09/08/23 1441 09/08/23 1928 09/08/23 2226 09/09/23 0259 09/09/23 0646  NA 120*   < > 126*   < > 130* 133* 130* 129* 130*  K 3.7  --  3.7  --   --   --   --   --   --   CL 86*  --  93*  --   --   --   --   --   --   CO2 24  --  24  --   --   --   --   --   --   GLUCOSE 89  --  95  --   --   --   --   --   --   BUN 11  --  10  --   --   --   --   --   --   CREATININE 0.65  --  0.61  --   --   --   --   --   --  CALCIUM  8.2*  --  8.1*  --   --   --   --   --   --    < > = values in this interval not displayed.   Liver Function Tests: Recent Labs  Lab 09/07/23 1443  AST 24  ALT 16  ALKPHOS 54  BILITOT 0.5  PROT 6.4*  ALBUMIN 3.3*   CBG: No results for input(s): GLUCAP in the last 168 hours.  Discharge time spent: greater than 30 minutes.  This record has been created using Conservation officer, historic buildings. Errors have been sought and corrected,but may not always be located. Such creation errors do not reflect on the standard of care.   Signed: Amaryllis Dare, MD [facility] 09/09/2023

## 2023-09-09 NOTE — Plan of Care (Signed)
  Problem: Education: Goal: Knowledge of risk factors and measures for prevention of condition will improve 09/09/2023 0154 by Yvone Hedge, RN Outcome: Progressing 09/09/2023 0150 by Yvone Hedge, RN Outcome: Progressing   Problem: Coping: Goal: Psychosocial and spiritual needs will be supported 09/09/2023 0154 by Yvone Hedge, RN Outcome: Progressing 09/09/2023 0150 by Yvone Hedge, RN Outcome: Progressing   Problem: Respiratory: Goal: Will maintain a patent airway 09/09/2023 0154 by Yvone Hedge, RN Outcome: Progressing 09/09/2023 0150 by Yvone Hedge, RN Outcome: Progressing Goal: Complications related to the disease process, condition or treatment will be avoided or minimized 09/09/2023 0154 by Yvone Hedge, RN Outcome: Progressing 09/09/2023 0150 by Yvone Hedge, RN Outcome: Progressing   Problem: Education: Goal: Knowledge of General Education information will improve Description: Including pain rating scale, medication(s)/side effects and non-pharmacologic comfort measures 09/09/2023 0154 by Yvone Hedge, RN Outcome: Progressing 09/09/2023 0150 by Yvone Hedge, RN Outcome: Progressing   Problem: Health Behavior/Discharge Planning: Goal: Ability to manage health-related needs will improve 09/09/2023 0154 by Yvone Hedge, RN Outcome: Progressing 09/09/2023 0150 by Yvone Hedge, RN Outcome: Progressing   Problem: Clinical Measurements: Goal: Ability to maintain clinical measurements within normal limits will improve 09/09/2023 0154 by Yvone Hedge, RN Outcome: Progressing 09/09/2023 0150 by Yvone Hedge, RN Outcome: Progressing Goal: Will remain free from infection 09/09/2023 0154 by Yvone Hedge, RN Outcome: Progressing 09/09/2023 0150 by Yvone Hedge, RN Outcome: Progressing Goal: Diagnostic test results will improve 09/09/2023 0154 by Yvone Hedge, RN Outcome: Progressing 09/09/2023 0150 by Yvone Hedge, RN Outcome: Progressing Goal: Respiratory  complications will improve 09/09/2023 0154 by Yvone Hedge, RN Outcome: Progressing 09/09/2023 0150 by Yvone Hedge, RN Outcome: Progressing Goal: Cardiovascular complication will be avoided 09/09/2023 0154 by Yvone Hedge, RN Outcome: Progressing 09/09/2023 0150 by Yvone Hedge, RN Outcome: Progressing   Problem: Activity: Goal: Risk for activity intolerance will decrease 09/09/2023 0154 by Yvone Hedge, RN Outcome: Progressing 09/09/2023 0150 by Yvone Hedge, RN Outcome: Progressing   Problem: Nutrition: Goal: Adequate nutrition will be maintained 09/09/2023 0154 by Yvone Hedge, RN Outcome: Progressing 09/09/2023 0150 by Yvone Hedge, RN Outcome: Progressing   Problem: Coping: Goal: Level of anxiety will decrease 09/09/2023 0154 by Yvone Hedge, RN Outcome: Progressing 09/09/2023 0150 by Yvone Hedge, RN Outcome: Progressing   Problem: Elimination: Goal: Will not experience complications related to bowel motility 09/09/2023 0154 by Yvone Hedge, RN Outcome: Progressing 09/09/2023 0150 by Yvone Hedge, RN Outcome: Progressing Goal: Will not experience complications related to urinary retention 09/09/2023 0154 by Yvone Hedge, RN Outcome: Progressing 09/09/2023 0150 by Yvone Hedge, RN Outcome: Progressing   Problem: Pain Managment: Goal: General experience of comfort will improve and/or be controlled 09/09/2023 0154 by Yvone Hedge, RN Outcome: Progressing 09/09/2023 0150 by Yvone Hedge, RN Outcome: Progressing   Problem: Safety: Goal: Ability to remain free from injury will improve 09/09/2023 0154 by Yvone Hedge, RN Outcome: Progressing 09/09/2023 0150 by Yvone Hedge, RN Outcome: Progressing   Problem: Skin Integrity: Goal: Risk for impaired skin integrity will decrease 09/09/2023 0154 by Yvone Hedge, RN Outcome: Progressing 09/09/2023 0150 by Yvone Hedge, RN Outcome: Progressing

## 2023-09-09 NOTE — TOC Transition Note (Addendum)
 Transition of Care [facility]) - Discharge Note   Patient Details  Name: Brooke [name] MRN: [id number] Date of Birth: Mar 27, 1946  [facility]) CM/SW Contact:  Brooke Pacholski C Broady Lafoy, RN Phone Number: 09/09/2023, 10:56 AM   Clinical Narrative:    Spoke with patient regarding  therapy recommendation and RW. Patient refused HH and DME. She stated her granddaughter would come to pick her up.  MD and nurse notified.   12:08pm RNCM contact patient's daughter, Brooke [name] per her request. She requested HHPT for patient. She has been advised patient refused HH and the RW. Patient daughter stated she patient could benefit from [facility]. RNCM advised patient is alert and oriented x4 and had to be agreeable.    Retrieved message from primary nurse stating patient had changed her mind and is wanting home health. Spoke with patient's daughter. She did not have a choice of agency. Referral sent and accepted by Dorothe from [facility] for HHPT/ OT. Per Dorothe [facility] will be Monday. Patient daughter notified of SOC.   TOC signing off.         Patient Goals and CMS Choice            Discharge Placement                       Discharge Plan and Services Additional resources added to the After Visit Summary for                                       Social Drivers of Health (SDOH) Interventions SDOH Screenings   Food Insecurity: No Food Insecurity (09/08/2023)  Housing: Low Risk  (09/08/2023)  Transportation Needs: No Transportation Needs (09/08/2023)  Utilities: Not At Risk (09/08/2023)  Alcohol Screen: Low Risk  (06/02/2023)  Depression (PHQ2-9): Low Risk  (06/02/2023)  Recent Concern: Depression (PHQ2-9) - Medium Risk (05/24/2023)  Social Connections: Moderately Integrated (09/08/2023)  Tobacco Use: Low Risk  (07/22/2023)  Health Literacy: Adequate Health Literacy (06/02/2023)     Readmission Risk Interventions     No data to display

## 2023-09-10 ENCOUNTER — Telehealth: Payer: Self-pay

## 2023-09-10 NOTE — Transitions of Care (Post Inpatient/ED Visit) (Signed)
   09/10/2023  Name: Brooke [name] MRN: [id number] DOB: January 21, 1946  Today's TOC FU Call Status: Today's TOC FU Call Status:: Unsuccessful Call (1st Attempt) Unsuccessful Call (1st Attempt) Date: 09/10/23  Attempted to reach the patient regarding the most recent Inpatient/ED visit. Spoke with patient briefly, she did not want to complete TOC visit at this time . Attempted to speak with daughter , who was  N/A , per patient she was not there  Patient agreed to a call back  2/10/ 25   Follow Up Plan: Additional outreach attempts will be made to reach the patient to complete the Transitions of Care (Post Inpatient/ED visit) call.   Bari Mayans , BSN, RN [facility]   VBCI-Population Health RN Care Manager Direct Dial [phone]  Fax: [phone] Website: [email or web address]

## 2023-09-13 ENCOUNTER — Telehealth: Payer: Self-pay | Admitting: Pediatrics

## 2023-09-13 NOTE — Telephone Encounter (Signed)
 Copied from CRM [id number]. Topic: Quick Communication - Home Health Verbal Orders >> Sep 13, 2023 11:14 AM Everette C wrote: Caller/Agency: Sherline Distel / [facility]  Callback Number:  [phone] Service Requested: Physical Therapy  The patient has declined physical therapy services

## 2023-09-15 ENCOUNTER — Encounter: Payer: Self-pay | Admitting: Pediatrics

## 2023-09-15 ENCOUNTER — Telehealth: Payer: Medicare Other | Admitting: Pediatrics

## 2023-09-15 ENCOUNTER — Telehealth: Payer: Self-pay

## 2023-09-15 DIAGNOSIS — J449 Chronic obstructive pulmonary disease, unspecified: Secondary | ICD-10-CM

## 2023-09-15 DIAGNOSIS — Z5189 Encounter for other specified aftercare: Secondary | ICD-10-CM | POA: Diagnosis not present

## 2023-09-15 DIAGNOSIS — F419 Anxiety disorder, unspecified: Secondary | ICD-10-CM

## 2023-09-15 DIAGNOSIS — G500 Trigeminal neuralgia: Secondary | ICD-10-CM

## 2023-09-15 DIAGNOSIS — U071 COVID-19: Secondary | ICD-10-CM

## 2023-09-15 DIAGNOSIS — R5381 Other malaise: Secondary | ICD-10-CM | POA: Diagnosis not present

## 2023-09-15 DIAGNOSIS — Z09 Encounter for follow-up examination after completed treatment for conditions other than malignant neoplasm: Secondary | ICD-10-CM

## 2023-09-15 DIAGNOSIS — E871 Hypo-osmolality and hyponatremia: Secondary | ICD-10-CM | POA: Diagnosis not present

## 2023-09-15 DIAGNOSIS — F325 Major depressive disorder, single episode, in full remission: Secondary | ICD-10-CM

## 2023-09-15 MED ORDER — CLONAZEPAM 0.5 MG PO TABS: 0.5000 mg | ORAL_TABLET | Freq: Two times a day (BID) | ORAL | 1 refills | Status: DC | PRN

## 2023-09-15 MED ORDER — BUTALBITAL-ASPIRIN-CAFFEINE 50-325-40 MG PO CAPS
1.0000 | ORAL_CAPSULE | Freq: Two times a day (BID) | ORAL | 1 refills | Status: DC | PRN
Start: 2023-09-15 — End: 2023-10-19

## 2023-09-15 NOTE — Patient Instructions (Signed)
Visit Information  Thank you for taking time to visit with me today. Please don't hesitate to contact me if I can be of assistance to you before our next scheduled telephone appointment.  Our next appointment is by telephone on 2/17 at 10:00am  Following is a copy of your care plan:   Goals Addressed             This Visit's Progress    TOC Care Plan       Current Barriers:  Medication management review psych meds and side effects  Provider appointments with PCP  Home Health services Accept [facility] services   RNCM Clinical Goal(s):  Patient will with have virtual visit with PCP through collaboration with RN Care manager, provider, and care team.   Interventions: Evaluation of current treatment plan related to  self management and patient's adherence to plan as established by provider  Transitions of Care:  New goal. Doctor Visits  - discussed the importance of doctor visits Post discharge activity limitations prescribed by provider reviewed Reviewed Signs and symptoms of infection  Patient Goals/Self-Care Activities: Participate in Transition of Care Program/Attend TOC scheduled calls Take all medications as prescribed Attend all scheduled provider appointments Call pharmacy for medication refills 3-7 days in advance of running out of medications Perform all self care activities independently  Perform IADL's (shopping, preparing meals, housekeeping, managing finances) independently Call provider office for new concerns or questions   Follow Up Plan:  Telephone follow up appointment with care management team member scheduled for:  09/20/23 10:00am The patient has been provided with contact information for the care management team and has been advised to call with any health related questions or concerns.         Medication review  Reviewed current home medications -- provided education as needed. Patient is aware of potential side effects and was encouraged to notify  PCP for any adverse side effects or unwanted symptoms not relieved with interventions  Patient will call 911 for Medical Emergencies or Life -Threatening Symptoms.  Reviewed goals for care Patient/ Caregiver verbalizes understanding of instructions with the plan of care . The  Patient / Caregiver was encouraged to make informed decisions about care, actively participate in managing health conditions, and implement lifestyle changes as needed to promote independence and self-management of healthcare. SDOH screenings have been completed and addressed if indicted.  There are no reported barriers to care.    Follow-up Plan VBCI Case Management Nurse will provide follow-up and on-going assessment ,evaluation and education of disease processes, recommended interventions for both chronic and acute medical conditions ,  along with ongoing review of symptoms ,medication reviews / reconciliation during each weekly call . Any updates , inconsistencies, discrepancies or acute care concerns will be addressed and routed to the correct Practitioner if indicated   [facility]  Please call the care guide team at [phone]  if you need to cancel or reschedule your appointment . For scheduled calls -Three attempts will be made to reach you -if the scheduled call is missed or  we are unable to reach the you after 3 attempts no additional outreach attempts will be made and the TOC follow-up will be closed .   If you need to speak to a Nurse you may  call me directly at the number below or if I am unavailable,and  your need is urgent  please call the main VBCI number at [phone] and ask to speak with one of the TOC (  Transition of Care )  Nurses  .  Patient was encouraged to Contact PCP with any changes in baseline or  medication regimen,  changes in health status  /  well-being, safety concerns, including falls any questions or concerns regarding ongoing medical care, any difficulty obtaining or  picking up prescriptions, any changes or worsening in condition- including  symptoms not relieved  with interventions                                                                            Additionally, If you experience worsening of your symptoms, develop shortness of breath, If you are experiencing a medical emergency,  develop suicidal or homicidal thoughts you must seek medical attention immediately by calling 911 or report to your local emergency department or urgent care.   If you have a non-emergency medical problem during routine business hours, please contact your provider's office and ask to speak with a nurse.       Please take the time to read instructions/literature along with the possible adverse reactions/side effects for all the Medicines that have been prescribed to you. Only take newly prescribed  Medications after you have completely understood and accept all the possible adverse reactions/side effects.   Do not take more than prescribed Medications for  Pain, Sleep and Anxiety. Do not drive when taking Pain medications or sleep aid/ insomnia  medications It is not advisable to combine anxiety, sleep and pain medications without talking with your primary care practitioner    If you are experiencing a Mental Health or Behavioral Health Crisis or need someone to talk to Please call the Suicide and Crisis Lifeline: 988 You may also call the [place] National Suicide Prevention Lifeline: (415)283-8771 or TTY: 540-101-6121 TTY (424)555-3892) to talk to a trained counselor.  You may call the Behavioral Health Crisis Line at 4706198042, at any time, 24 hours a day, 7 days a week- however If you are in danger or need immediate medical attention, call 911.   If you would like help to quit smoking, call 1-800-QUIT-NOW ( (225)847-3053) OR Espaol: 1-855-Djelo-Ya (7-253-664-4034) o para ms informacin haga clic aqu or Text READY to 742-595 to register via text.   Susa Loffler  , BSN, RN [facility]   VBCI-Population Health RN Care Manager Direct Dial [phone]  Website: [email or web address]

## 2023-09-15 NOTE — Transitions of Care (Post Inpatient/ED Visit) (Signed)
09/15/2023  Name: Brooke [name] MRN: [id number] DOB: April 11, 1946  Today's TOC FU Call Status: Today's TOC FU Call Status:: Successful TOC FU Call Completed Unsuccessful Call (1st Attempt) Date: 09/10/23 [facility] FU Call Complete Date: 09/15/23 Patient's Name and Date of Birth confirmed.  Transition Care Management Follow-up Telephone Call Date of Discharge: 09/09/23 Discharge Facility[facility]) Type of Discharge: Inpatient Admission Primary Inpatient Discharge Diagnosis:: Hyponatremia, symptomatic How have you been since you were released from the hospital?: Same Any questions or concerns?: Yes Patient Questions/Concerns:: Patient refused HH , the order expired and she is trying to get a new referral  Items Reviewed: Did you receive and understand the discharge instructions provided?: Yes Medications obtained,verified, and reconciled?: Yes (Medications Reviewed) Any new allergies since your discharge?: No Dietary orders reviewed?: Yes Type of Diet Ordered:: Reg Heart Healthy Do you have support at home?: Yes People in Home: Brooke, child(ren), adult Name of Support/Comfort Primary Source: Brooke [name], Brooke [name], Brooke [name]  Medications Reviewed Today: Medications Reviewed Today     Reviewed by Johnnette Barrios, RN (Registered Nurse) on 09/15/23 at 1733  Med List Status: <None>   Medication Order Taking? Sig Documenting Provider Last Dose Status Informant  albuterol (VENTOLIN HFA) 108 (90 Base) MCG/ACT inhaler [id number] Yes Inhale 1-2 puffs into the lungs every 6 (six) hours as needed for wheezing or shortness of breath. Jackolyn Confer, MD Taking Active   butalbital-aspirin-caffeine [facility]) [phone] MG capsule [id number] Yes Take 1 capsule by mouth 2 (two) times daily as needed for headache or migraine. Jackolyn Confer, MD Taking Active   clonazePAM Scarlette Calico) 0.5 MG tablet [id number] Yes Take 1 tablet (0.5 mg total) by mouth 2 (two) times  daily as needed for anxiety. Jackolyn Confer, MD Taking Active   FLUoxetine (PROZAC) 20 MG capsule [id number]  Take 2 capsules (40 mg total) by mouth 2 (two) times daily. Jackolyn Confer, MD  Expired 09/08/23 2359            Med Note Littie Deeds, CHERYL A   Wed Jun 02, 2023  2:03 PM) 60mg  currently  gabapentin (NEURONTIN) 100 MG capsule [id number] Yes Take 1 capsule (100 mg total) by mouth 2 (two) times daily. Jackolyn Confer, MD Taking Active   ipratropium (ATROVENT) 0.06 % nasal spray [id number] Yes Place 1 spray into both nostrils 3 (three) times daily as needed for rhinitis. Jackolyn Confer, MD Taking Active            Med Note Delmar Landau   Wed Sep 08, 2023  9:39 AM) PRN  losartan (COZAAR) 25 MG tablet [id number] Yes Take 25 mg by mouth in the morning and at bedtime. [provider] Taking Active Self  omeprazole (PRILOSEC) 20 MG capsule [id number] Yes Take 1 capsule (20 mg total) by mouth 2 (two) times daily before a meal. Jackolyn Confer, MD Taking Active   ondansetron (ZOFRAN) 4 MG tablet [id number] Yes Take 1 tablet (4 mg total) by mouth every 8 (eight) hours as needed for nausea or vomiting. Jackolyn Confer, MD Taking Active            Med Note Delmar Landau   Wed Sep 08, 2023  9:39 AM) PRN  promethazine (PHENERGAN) 12.5 MG tablet [id number] Yes Take 1 tablet (12.5 mg total) by mouth every 6 (six) hours as needed for nausea or vomiting (If Zofran is not working). Arnetha Courser, MD Taking Active   traZODone (DESYREL) 100 MG  tablet [id number] Yes Take 1 tablet (100 mg total) by mouth at bedtime. Jackolyn Confer, MD Taking Active           Medication reconciliation / review completed based on most recent discharge summary and EHR medication list. Confirmed patient is taking all newly prescribed medications as instructed (any discrepancies are noted in review section)   Patient / Caregiver is aware of any changes to and / or  any dosage adjustments to medication regimen. Patient/  Caregiver denies questions at this time and reports no barriers to medication adherence.   Home Care and Equipment/Supplies: Were Home Health Services Ordered?: Yes Name of Home Health Agency:: [facility] [phone]- per Jasmine December Referral expired Has Agency set up a time to come to your home?: Yes First Home Health Visit Date: 09/13/23 (patient declined services) Any new equipment or medical supplies ordered?: No  Functional Questionnaire: Do you need assistance with bathing/showering or dressing?: Yes Do you need assistance with meal preparation?: Yes Do you need assistance with eating?: No Do you have difficulty maintaining continence: No Do you need assistance with getting out of bed/getting out of a chair/moving?: Yes Do you have difficulty managing or taking your medications?: Yes (Family manages)  Follow up appointments reviewed: PCP Follow-up appointment confirmed?: Yes Date of PCP follow-up appointment?: 09/15/23 Follow-up Provider: Modena [facility]un[facility]y Specialist [facility] Follow-up appointment confirmed?: NA Do you need transportation to your follow-up appointment?: No Do you understand care options if your condition(s) worsen?: Yes-patient verbalized understanding  SDOH Interventions Today    Flowsheet Row Most Recent Value  SDOH Interventions   Food Insecurity Interventions Intervention Not Indicated  Housing Interventions Intervention Not Indicated  Transportation Interventions Intervention Not Indicated, Patient Resources (Friends/Family)  Utilities Interventions Intervention Not Indicated       Goals Addressed             This Visit's Progress    TOC Care Plan       Current Barriers:  Medication management review psych meds and side effects  Provider appointments with PCP  Home Health services Accept [facility] services   RNCM Clinical Goal(s):  Patient will with have virtual visit with PCP through collaboration with RN Care manager, provider, and care team.    Interventions: Evaluation of current treatment plan related to  self management and patient's adherence to plan as established by provider  Transitions of Care:  New goal. Doctor Visits  - discussed the importance of doctor visits Post discharge activity limitations prescribed by provider reviewed Reviewed Signs and symptoms of infection  Patient Goals/Self-Care Activities: Participate in Transition of Care Program/Attend TOC scheduled calls Take all medications as prescribed Attend all scheduled provider appointments Call pharmacy for medication refills 3-7 days in advance of running out of medications Perform all self care activities independently  Perform IADL's (shopping, preparing meals, housekeeping, managing finances) independently Call provider office for new concerns or questions   Follow Up Plan:  Telephone follow up appointment with care management team member scheduled for:  09/20/23 10:00am The patient has been provided with contact information for the care management team and has been advised to call with any health related questions or concerns.           Patient is at high risk for readmission and/or has history of  high utilization  Discussed VBCI  TOC program and weekly calls to patient to assess condition/status, medication management  and provide support/education as indicated . Patient/ Caregiver voiced understanding and is  agreeable to 30  day program    Routine follow-up and on-going assessment evaluation and education of disease processes, recommended interventions for both chronic and acute medical conditions , will occur during each weekly visit along with ongoing review of symptoms ,medication reviews and reconciliation. Any updates , inconsistencies, discrepancies or acute care concerns will be addressed and routed to the correct Practitioner if indicated   Based on current information and Insurance plan -Reviewed benefits available to patient, including  details about eligibility options for care if any area of needs were identified.  Reviewed patients ability to access and / or navigating the benefits system..Amb Referral made if indicted , refer to orders section of note for details   Please refer to Care Plan for goals and interventions -Effectiveness of interventions, symptom management and outcomes will be evaluated  weekly during [facility] 30-day Program Outreach calls  . Any necessary  changes and updates to Care Plan will be completed episodically    Reviewed goals for care Patient verbalizes understanding of instructions and care plan provided. Patient was encouraged to make informed decisions about their care, actively participate in managing their health condition, and implement lifestyle changes as needed to promote independence and self-management of health care   Patient was encouraged to Contact PCP with any changes in baseline or  medication regimen,  changes in health status  /  well-being, safety concerns, including falls any questions or concerns regarding ongoing medical care, any difficulty obtaining or picking up prescriptions, any changes or worsening in condition- including  symptoms not relieved  with interventions    The patient has been provided with contact information for the care management team and has been advised to call with any health-related questions or concerns. Follow up as indicated with Care Team , or sooner should any new problems arise.   Susa Loffler , BSN, RN [facility]   VBCI-Population Health RN Care Manager Direct Dial [phone]  Fax: [phone] Website: [email or web address]

## 2023-09-15 NOTE — Patient Instructions (Signed)
Plan to resume the PT/OT orders  Please stop by the lab to repeat labs.

## 2023-09-15 NOTE — Progress Notes (Signed)
Telehealth Visit  I connected with  Brooke [name] on 09/19/23 by a video enabled telemedicine application and verified that I am speaking with the correct person using two identifiers.   I discussed the limitations of evaluation and management by telemedicine. The patient expressed understanding and agreed to proceed.  Subjective:    Patient ID: Brooke [name], female    DOB: 10/23/1945, 78 y.o.   MRN: [id number]  HPI: Brooke [name] is a 78 y.o. female  Chief Complaint  Patient presents with   PT/OT    Face to Face.    Discussed the use of AI scribe software for clinical note transcription with the patient, who gave verbal consent to proceed.  History of Present Illness   Brooke [name] is a 78 year old female who presents with fatigue, weakness, and balance issues. She is accompanied by her husband.  She experiences significant fatigue, weakness, and balance issues, impacting her ability to perform basic activities such as bathing. These symptoms leave her feeling exhausted. Her husband was recently hospitalized, but she brought him home, although she cannot recall the exact date.  She has been informed of low sodium levels from recent blood work conducted during a hospital stay. She is currently taking Klonopin and headache medication, but there have been issues with refilling these prescriptions. She has a few days' worth of Klonopin left and requires a refill.  She requires updated orders for physical therapy (PT) and occupational therapy (OT) as previous orders have expired. She mentions needing to move around more during the day to aid in her recovery.  Regarding hydration, she primarily drinks Sprite and dislikes Gatorade. Her recent diet included green beans, [place] fries, fried okra, and bread, along with Sprite.  Her father is starting chemotherapy and radiation next week, and she is responsible for taking him to appointments, which adds to her responsibilities at  home.  No significant issues with breathing at the moment.     Relevant past medical, surgical, family and social history reviewed and updated as indicated. Interim medical history since our last visit reviewed. Allergies and medications reviewed and updated.  ROS per HPI unless specifically indicated above     Objective:    There were no vitals taken for this visit.  Wt Readings from Last 3 Encounters:  07/22/23 116 lb 12.8 oz (53 kg)  06/24/23 117 lb 6.4 oz (53.3 kg)  06/15/23 118 lb 9.6 oz (53.8 kg)     Physical Exam Constitutional:      General: She is not in acute distress.    Appearance: Normal appearance.  Pulmonary:     Effort: Pulmonary effort is normal. No respiratory distress.     Breath sounds: Normal breath sounds.  Neurological:     General: No focal deficit present.     Mental Status: She is alert. Mental status is at baseline.     LIMITED EXAM GIVEN VIDEO VISIT     Assessment & Plan:  Assessment & Plan   Hospital discharge follow-up Hospitalization reviewed, medications reviewed. Plan and follow up for acute concerns below.  COVID-19 virus infection Chronic obstructive pulmonary disease, unspecified COPD type (HCC) Physical deconditioning Patient reports fatigue and lack of balance following recent hospitalization. No specific diagnosis mentioned, but the context suggests possible recent COVID-19 infection. Patient requires updated order for physical therapy (PT) and occupational therapy (OT). -Sign and send updated order for PT and OT. -Request home health to provide blood pressure cuff and vitals  monitoring equipment. -Encourage hydration through fluids and soups. -Repeat labs from hospital stay in 1-2 weeks to monitor recovery. -     Ambulatory referral to Home Health  Hyponatremia, symptomatic Seen during admission. Plan to return for blood work given limited strength and still quarantining.  Major depressive disorder with single episode, in  full remission [facility]) Assessment & Plan: Comorbid anxiety. Chronically managed with prozac and klonopin. Refills requested as below.  Orders: -     clonazePAM; Take 1 tablet (0.5 mg total) by mouth 2 (two) times daily as needed for anxiety.  Dispense: 90 tablet; Refill: 1  Anxiety -     clonazePAM; Take 1 tablet (0.5 mg total) by mouth 2 (two) times daily as needed for anxiety.  Dispense: 90 tablet; Refill: 1  Trigeminal neuralgia Assessment & Plan: Requesting refills on fiorinal. Sent below.  Orders: -     Butalbital-Aspirin-Caffeine; Take 1 capsule by mouth 2 (two) times daily as needed for headache or migraine.  Dispense: 180 capsule; Refill: 1  Follow up plan: Return in about 1 week (around 09/22/2023).  Sadiya Durand P Ellery Tash, MD   This visit was completed via video visit through MyChart due to the restrictions of the COVID-19 pandemic. All issues as above were discussed and addressed. Physical exam was done as above through visual confirmation on video through MyChart. If it was felt that the patient should be evaluated in the office, they were directed there. The patient verbally consented to this visit."} Location of the patient: home Location of the provider: work Those involved with this call:  Provider: Modena Nunnery, MD CMA:  Natalia Leatherwood Lumpkins Time spent on call:  10 minutes with patient face to face via video conference. More than 50% of this time was spent in counseling and coordination of care. 20 minutes total spent in review of patient's record and preparation of their chart. Total time spent on this encounter: 30 minutes.

## 2023-09-19 ENCOUNTER — Encounter: Payer: Self-pay | Admitting: Pediatrics

## 2023-09-19 NOTE — Assessment & Plan Note (Signed)
Comorbid anxiety. Chronically managed with prozac and klonopin. Refills requested as below.

## 2023-09-19 NOTE — Assessment & Plan Note (Signed)
Requesting refills on fiorinal. Sent below.

## 2023-09-20 ENCOUNTER — Other Ambulatory Visit: Payer: Self-pay

## 2023-09-20 ENCOUNTER — Ambulatory Visit: Payer: Medicare Other | Admitting: Nurse Practitioner

## 2023-09-20 NOTE — Patient Outreach (Signed)
  Care Management  Transitions of Care Program Transitions of Care Post-discharge week 2  09/20/2023 Name: Brooke [name] MRN: [id number] DOB: 1945/12/29  Subjective: Brooke [name] is a 78 y.o. year old female who is a primary care patient of [name], MD. The Care Management team was unable to reach the patient by phone to assess and address transitions of care needs.   Patient  Outreach attempt is in the course of  [facility]I  30-day TOC program. Pt previously agreed and is enrolled in the  program due to potential risk for readmission and/or high utilization. Unfortunately, I was not able to speak with the patient in regards to recent hospital discharge     Patient's voicemail has  a generic greeting. To maintain HIPAA compliance, left message including only VBCI CM contact information and a request for a call back .    Plan: Additional outreach attempts will be made to reach the patient enrolled in the [facility] Program (Post Inpatient/ED Visit).  Susa Loffler , BSN, RN [facility]   VBCI-Population Health RN Care Manager Direct Dial [phone]  Fax: [phone] Website: [email or web address]

## 2023-09-21 ENCOUNTER — Telehealth: Payer: Self-pay

## 2023-09-21 ENCOUNTER — Telehealth: Payer: Self-pay | Admitting: Pediatrics

## 2023-09-21 NOTE — Patient Outreach (Signed)
Care Management  Transitions of Care Program Transitions of Care Post-discharge week 2   09/21/2023 Name: Brooke [name] MRN: [id number] DOB: [birth year]  Subjective: Brooke [name] is a 77 y.o. year old female who is a primary care patient of Evelene Croon, Atilano Median, MD. The Care Management team Engaged with patient Engaged with patient by telephone to assess and address transitions of care needs.   Consent to Services:  Patient was given information about care management services, agreed to services, and gave verbal consent to participate.   Assessment:   Patient/ Caregiver  voices no new complaints or concerns  and has not developed/ reported any new medical issues / Dx or acute changes. - since last follow-up call for most recent Hospital stay      2/4-2/6  / 2025 She is doing better. She initially refused HH PT/OT but changed her mind She had video visit with her CP and a new referral was initialed. Per patient services had not started. She was slightly distracted during call as HH was there visiting spouse but declined offer for a call at a later time   Medication reconciliation/ review completed based on most recent medication list in EHR; and in review of recent Provider follow-up appointments- confirmed patient obtained / is taking all newly prescribed medications as instructed and is aware of any changes to previous medications regimen including dosage adjustments. Patient self-Caregiver manages medications; denies questions/ concerns or barriers to medication adherence at this time  Patient / Caregiver educated on red flag s/s to watch for based on current discharge diagnosis and was encouraged to report, any changes in baseline or  medication regimen,   or any new unmanaged side effects or symptoms not relieved with interventions  to PCP and / or the  VBCI Case Management team .          SDOH Interventions    Flowsheet Row Telephone from 09/15/2023 in [place] [facility]  DEPARTMENT Office Visit from 05/12/2023 in [facility]  SDOH Interventions    Food Insecurity Interventions Intervention Not Indicated --  Housing Interventions Intervention Not Indicated --  Transportation Interventions Intervention Not Indicated, Patient Resources (Friends/Family) --  Utilities Interventions Intervention Not Indicated --  Depression Interventions/Treatment  -- Currently on Treatment        Goals Addressed             This Visit's Progress    TOC Care Plan       Current Barriers:  Medication management review psych meds and side effects  Provider appointments with PCP  Home Health services Accept [facility] services   RNCM Clinical Goal(s):  Patient will with have virtual visit with PCP through collaboration with RN Care manager, provider, and care team.   Interventions: Evaluation of current treatment plan related to  self management and patient's adherence to plan as established by provider  Transitions of Care:  Goal on track:  Yes. Doctor Visits  - discussed the importance of doctor visits Contacted provider for patient needs Called PCP to request referral for [facility]  be resent  Contacted Health RN/OT/PT - Spoke with provider at [facility] to verify they were able to accept patient  Post discharge activity limitations prescribed by provider reviewed Reviewed Signs and symptoms of infection  Patient Goals/Self-Care Activities: Participate in Transition of Care Program/Attend TOC scheduled calls Take all medications as prescribed Attend all scheduled provider appointments Call pharmacy for medication refills 3-7 days in advance of running out  of medications Perform all self care activities independently  Perform IADL's (shopping, preparing meals, housekeeping, managing finances) independently Call provider office for new concerns or questions   Follow Up Plan:  Telephone follow up appointment with care management team member scheduled  for:  09/29/23 1;00pm The patient has been provided with contact information for the care management team and has been advised to call with any health related questions or concerns.  Follow up with provider re: need for referral for [facility] to be resent           Plan:   Spoke with PCP office( [facility])  and requested referral be sent to [facility]. Spoke with Irving Burton ( PT) with [facility] and verified they could  accept patient once referral is received.   Routine follow-up and on-going assessment evaluation and education of disease processes, recommended interventions for both chronic and acute medical conditions , will occur during each weekly visit along with ongoing review of symptoms ,medication reviews and reconciliation. Any updates , inconsistencies, discrepancies or acute care concerns will be addressed and routed to the correct Practitioner if indicated   Based on current information and Insurance plan -Reviewed benefits available to patient, including details about eligibility options for care if any area of needs were identified.  Reviewed patients ability to access and / or navigating the benefits system..Amb Referral made if indicted , refer to orders section of note for details   Please refer to Care Plan for goals and interventions -Effectiveness of interventions, symptom management and outcomes will be evaluated  weekly during [facility] 30-day Program Outreach calls  . Any necessary  changes and updates to Care Plan will be completed episodically    Reviewed goals for care Patient verbalizes understanding of instructions and care plan provided. Patient was encouraged to make informed decisions about their care, actively participate in managing their health condition, and implement lifestyle changes as needed to promote independence and self-management of health care   Patient was encouraged to Contact PCP with any changes in baseline or  medication regimen,  changes in health status  /  well-being,  safety concerns, including falls any questions or concerns regarding ongoing medical care, any difficulty obtaining or picking up prescriptions, any changes or worsening in condition- including  symptoms not relieved  with interventions    The patient has been provided with contact information for the care management team and has been advised to call with any health-related questions or concerns. Follow up as indicated with Care Team , or sooner should any new problems arise.   The patient has been provided with contact information for the care management team and has been advised to call with any health related questions or concerns.   Susa Loffler , BSN, RN [facility]   VBCI-Population Health RN Care Manager Direct Dial [phone]  Fax: [phone] Website: [email or web address]

## 2023-09-21 NOTE — Telephone Encounter (Signed)
Neysa Bonito, a Nurse Case Manager with  [facility] is calling in because she says she spoke with [facility] and they are able to do pt's home health services they just need a referral placed. Neysa Bonito is requesting the referral be sent or resent.

## 2023-09-27 ENCOUNTER — Other Ambulatory Visit: Payer: Self-pay | Admitting: Pediatrics

## 2023-09-27 DIAGNOSIS — G500 Trigeminal neuralgia: Secondary | ICD-10-CM

## 2023-09-27 DIAGNOSIS — A084 Viral intestinal infection, unspecified: Secondary | ICD-10-CM

## 2023-09-27 DIAGNOSIS — E871 Hypo-osmolality and hyponatremia: Secondary | ICD-10-CM | POA: Diagnosis not present

## 2023-09-27 DIAGNOSIS — U071 COVID-19: Secondary | ICD-10-CM | POA: Diagnosis not present

## 2023-09-27 DIAGNOSIS — J449 Chronic obstructive pulmonary disease, unspecified: Secondary | ICD-10-CM | POA: Diagnosis not present

## 2023-09-27 DIAGNOSIS — R110 Nausea: Secondary | ICD-10-CM

## 2023-09-28 NOTE — Telephone Encounter (Signed)
 Requested medications are due for refill today.  yes  Requested medications are on the active medications list.  yes  Last refill. varied  Future visit scheduled.   yes  Notes to clinic.  Refill/refusal not delegated.    Requested Prescriptions  Pending Prescriptions Disp Refills   butalbital-acetaminophen-caffeine (FIORICET) 50-325-40 MG tablet [Pharmacy Med Name: BUTALB-ACETAMIN-CAFF 50-325-40] 60 tablet     Sig: Take 2 tablets by mouth 2 (two) times daily as needed for headache.     Not Delegated - Analgesics:  Non-Opioid Analgesic Combinations 2 Failed - 09/28/2023  2:16 PM      Failed - This refill cannot be delegated      Passed - Cr in normal range and within 360 days    Creatinine, Ser  Date Value Ref Range Status  09/08/2023 0.61 0.44 - 1.00 mg/dL Final         Passed - eGFR is 10 or above and within 360 days    GFR calc Af Amer  Date Value Ref Range Status  05/12/2016 >60 >60 mL/min Final    Comment:    (NOTE) The eGFR has been calculated using the CKD EPI equation. This calculation has not been validated in all clinical situations. eGFR's persistently <60 mL/min signify possible Chronic Kidney Disease.    GFR, Estimated  Date Value Ref Range Status  09/08/2023 >60 >60 mL/min Final    Comment:    (NOTE) Calculated using the CKD-EPI Creatinine Equation (2021)    eGFR  Date Value Ref Range Status  06/24/2023 73 >59 mL/min/1.73 Final         Passed - Patient is not pregnant      Passed - Valid encounter within last 12 months    Recent Outpatient Visits           2 months ago Viral gastroenteritis   [place] [facility] Jack[facility]yn Confer, MD   3 months ago Irritable bowel syndrome with both constipation and diarrhea   [facility] Jackolyn Confer, MD   3 months ago Dizziness   [place] [facility] Jackolyn Confer, MD   3 months ago Irritable bowel syndrome with both constipation and diarrhea    [place] [facility] Jackolyn Confer, MD   4 months ago Allergic reaction to drug, subsequent encounter   [place] [facility] [place], Sherran Needs, NP       Future Appointments             In 3 weeks Gollan, Tollie Pizza, MD [facility] [place]   In 3 weeks Evelene Croon, Atilano Median, MD [place] [facility]             ondansetron (ZOFRAN) 4 MG tablet [Pharmacy Med Name: ONDANSETRON HCL 4 MG TABLET] 20 tablet 0    Sig: TAKE 1 TABLET BY MOUTH EVERY 8 HOURS AS NEEDED FOR NAUSEA AND VOMITING     Not Delegated - Gastroenterology: Antiemetics - ondansetron Failed - 09/28/2023  2:16 PM      Failed - This refill cannot be delegated      Passed - AST in normal range and within 360 days    AST  Date Value Ref Range Status  09/07/2023 24 15 - 41 U/L Final         Passed - ALT in normal range and within 360 days    ALT  Date Value Ref Range Status  09/07/2023 16 0 - 44 U/L Final         Passed - Valid encounter within last 6 months    Recent Outpatient Visits           2 months ago Viral gastroenteritis   [place] [facility] Jackolyn Confer, MD   3 months ago Irritable bowel syndrome with both constipation and diarrhea   [place] [facility] Jackolyn Confer, MD   3 months ago Dizziness   [place] [facility] Jackolyn Confer, MD   3 months ago Irritable bowel syndrome with both constipation and diarrhea   [place] [facility] Jack[facility]yn Confer, MD   4 months ago Allergic reaction to drug, subsequent encounter   [place] [facility], Sherran Needs, NP       Future Appointments             In 3 weeks Gollan, Tollie Pizza, MD [facility] [place]   In 3 weeks Evelene Croon, Atilano Median, MD [facility]

## 2023-09-28 NOTE — Telephone Encounter (Signed)
 Only Zofran is due for refill.

## 2023-09-29 ENCOUNTER — Other Ambulatory Visit: Payer: Medicare Other | Admitting: *Deleted

## 2023-09-29 ENCOUNTER — Other Ambulatory Visit: Payer: Self-pay

## 2023-09-29 DIAGNOSIS — Z79899 Other long term (current) drug therapy: Secondary | ICD-10-CM

## 2023-09-29 DIAGNOSIS — J449 Chronic obstructive pulmonary disease, unspecified: Secondary | ICD-10-CM

## 2023-09-29 DIAGNOSIS — F419 Anxiety disorder, unspecified: Secondary | ICD-10-CM

## 2023-09-29 DIAGNOSIS — F325 Major depressive disorder, single episode, in full remission: Secondary | ICD-10-CM

## 2023-09-29 DIAGNOSIS — R531 Weakness: Secondary | ICD-10-CM

## 2023-09-29 NOTE — Patient Outreach (Signed)
 Care Management  Transitions of Care Program Transitions of Care Post-discharge week 3   09/29/2023 Name: Brooke [name] MRN: [id number] DOB: 05-12-1946  Subjective: Brooke [name] is a 78 y.o. year old female who is a primary care patient of Brooke [name], Brooke Median, MD. The Care Management team Engaged with patient Engaged with patient by telephone to assess and address transitions of care needs.   Consent to Services:  Patient was given information about care management services, agreed to services, and gave verbal consent to participate.   Assessment:   Patient/ Caregiver  voices no new complaints or concerns  and has not developed/ reported any new medical issues / Dx or acute changes. - since last follow-up call for most recent  Hospital stay  2/4-2/6  / 2025  No reported or reviewed Hospital Readmissions / No Urgent Care Visits / Transfers for Acute Change in condition .   No MD/Specialist or Consultant visits reported since last follow-up call  Patient was Alert & O x 4 ,responsive, able to voice needs, cooperative with visit   . She did have some trouble with name of Home Health and could not recall last visit There were no noted or reported cognitive/ mental status changes during this assessment Patient denies changes in ADL function or IADL;s  at this time. She does report  her spouse is receiving Chemo treatments for jaw cancer. He had a bad morning and she feel she needs help and was tearful at times Her daughter transports him daily but does not have the ability to stay for log periods Will req SW follow-up for some caregiver support  No reports of new onset or increased / uncontrolled pain, has chronic pain managed with medicinal and non-medicinal interventions.  No changes in skin, no rashes, wounds or open areas  Diet reviewed and  education provided .  Therapy Services with [facility] / PT  /  OT    Medication reconciliation/ review completed based on most recent medication list in  EHR; and in review of recent Provider follow-up appointments- confirmed patient obtained / is taking all newly prescribed medications as instructed and is aware of any changes to previous medications regimen including dosage adjustments.  Reports  no new changes in medication/treatment  regimen  during this assessment period.   Medications and current treatments are effective for use intended. No adverse Drug reactions  reported No changes or modifications indicated at this time.   Caregiver manages  medications Patient is able to take medications without difficulty;  denies questions/ concerns and reports no barriers to medication adherence at this time. Her daughter Brooke [name] set [place] her meds    Patient educated on red flag s/s to watch for based on current discharge diagnosis and was encouraged to report, any changes in baseline or  medication regimen,   or any new unmanaged side effects or symptoms not relieved with interventions  to PCP and / or the  [facility]BCI Case Management team .          SDOH Interventions    Flowsheet Row Telephone from 09/15/2023 in [place] [facility] Office Visit from 05/12/2023 in [facility]  SDOH Interventions    Food Insecurity Interventions Intervention Not Indicated --  Housing Interventions Intervention Not Indicated --  Transportation Interventions Intervention Not Indicated, Patient Resources (Friends/Family) --  Utilities Interventions Intervention Not Indicated --  Depression Interventions/Treatment  -- Currently on Treatment        Goals Addressed  This Visit's Progress    TOC Care Plan       Current Barriers:  Medication management review psych meds and side effects  Provider appointments with PCP  Home Health services Accept [facility] services  SDOH (other) for help at home and caregiver stress  Care Coordination needs related to Limited social support and Cognitive Deficits  RNCM  Clinical Goal(s):  Patient will with have virtual visit with PCP through collaboration with RN Care manager, provider, and care team.   Interventions: Evaluation of current treatment plan related to  self management and patient's adherence to plan as established by provider  Transitions of Care:  Goal on track:  Yes. Doctor Visits  - discussed the importance of doctor visits Contacted provider for patient needs Called PCP to request referral for [facility]  be resent  Contacted Health RN/OT/PT - Spoke with provider at [facility] to verify they were able to accept patient  Post discharge activity limitations prescribed by provider reviewed Reviewed Signs and symptoms of infection  Patient Goals/Self-Care Activities: Participate in Transition of Care Program/Attend TOC scheduled calls Take all medications as prescribed Attend all scheduled provider appointments Call pharmacy for medication refills 3-7 days in advance of running out of medications Perform all self care activities independently  Perform IADL's (shopping, preparing meals, housekeeping, managing finances) independently Call provider office for new concerns or questions   Follow Up Plan:  Telephone follow up appointment with care management team member scheduled for:  10/07/23 1;00pm The patient has been provided with contact information for the care management team and has been advised to call with any health related questions or concerns.  Follow up with provider re: need for referral for [facility] to be resent           Plan:  Amb referral for SW support and Caregiver stress  Routine follow-up and on-going assessment evaluation and education of disease processes, recommended interventions for both chronic and acute medical conditions , will occur during each weekly visit along with ongoing review of symptoms ,medication reviews and reconciliation. Any updates , inconsistencies, discrepancies or acute care concerns will be addressed and  routed to the correct Practitioner if indicated   Based on current information and Insurance plan -Reviewed benefits available to patient, including details about eligibility options for care if any area of needs were identified.  Reviewed patients ability to access and / or navigating the benefits system..Amb Referral made if indicted , refer to orders section of note for details   Please refer to Care Plan for goals and interventions -Effectiveness of interventions, symptom management and outcomes will be evaluated  weekly during [facility] 30-day Program Outreach calls  . Any necessary  changes and updates to Care Plan will be completed episodically    Reviewed goals for care Patient verbalizes understanding of instructions and care plan provided. Patient was encouraged to make informed decisions about their care, actively participate in managing their health condition, and implement lifestyle changes as needed to promote independence and self-management of health care   Patient was encouraged to Contact PCP with any changes in baseline or  medication regimen,  changes in health status  /  well-being, safety concerns, including falls any questions or concerns regarding ongoing medical care, any difficulty obtaining or picking up prescriptions, any changes or worsening in condition- including  symptoms not relieved  with interventions    The patient has been provided with contact information for the care management team and has been advised to call  with any health-related questions or concerns. Follow up as indicated with Care Team , or sooner should any new problems arise.    The patient has been provided with contact information for the care management team and has been advised to call with any health related questions or concerns.   Susa Loffler , BSN, RN [facility]   VBCI-Population Health RN Care Manager Direct Dial [phone]  Fax: [phone] Website: [email or web address]

## 2023-09-30 ENCOUNTER — Telehealth: Payer: Self-pay | Admitting: *Deleted

## 2023-09-30 NOTE — Progress Notes (Signed)
 Complex Care Management Note  Care Guide Note 09/30/2023 Name: KATALEYAH CARDUCCI MRN: [id number] DOB: 1946/04/23  LILLIANN ROSSETTI is a 78 y.o. year old female who sees Evelene Croon, Atilano Median, MD for primary care. I reached out to Virl Diamond by phone today to offer complex care management services.  Ms. Heuerman was given information about Complex Care Management services today including:   The Complex Care Management services include support from the care team which includes your Nurse Care Manager, Clinical Social Worker, or Pharmacist.  The Complex Care Management team is here to help remove barriers to the health concerns and goals most important to you. Complex Care Management services are voluntary, and the patient may decline or stop services at any time by request to their care team member.   Complex Care Management Consent Status: Patient agreed to services and verbal consent obtained.   Follow up plan:  Telephone appointment with complex care management team member scheduled for:  10/13/2023  Encounter Outcome:  Patient Scheduled  Burman Nieves, CMA, Care Guide [facility]  [facility], [facility] Guide Direct Dial: [phone]  Fax: [phone] Website: [email or web address]

## 2023-10-06 ENCOUNTER — Other Ambulatory Visit: Payer: Self-pay

## 2023-10-07 ENCOUNTER — Other Ambulatory Visit: Payer: Self-pay | Admitting: Pediatrics

## 2023-10-07 ENCOUNTER — Other Ambulatory Visit: Payer: Self-pay

## 2023-10-07 ENCOUNTER — Other Ambulatory Visit (HOSPITAL_COMMUNITY): Payer: Self-pay

## 2023-10-07 ENCOUNTER — Telehealth: Payer: Self-pay

## 2023-10-07 DIAGNOSIS — F325 Major depressive disorder, single episode, in full remission: Secondary | ICD-10-CM

## 2023-10-07 DIAGNOSIS — Z79899 Other long term (current) drug therapy: Secondary | ICD-10-CM

## 2023-10-07 DIAGNOSIS — R110 Nausea: Secondary | ICD-10-CM

## 2023-10-07 DIAGNOSIS — A084 Viral intestinal infection, unspecified: Secondary | ICD-10-CM

## 2023-10-07 DIAGNOSIS — K582 Mixed irritable bowel syndrome: Secondary | ICD-10-CM

## 2023-10-07 DIAGNOSIS — J449 Chronic obstructive pulmonary disease, unspecified: Secondary | ICD-10-CM

## 2023-10-07 MED ORDER — ONDANSETRON HCL 4 MG PO TABS
4.0000 mg | ORAL_TABLET | Freq: Three times a day (TID) | ORAL | 0 refills | Status: DC | PRN
  Filled 2023-10-07 – 2023-10-11 (×2): qty 20, 7d supply, fill #0

## 2023-10-07 NOTE — Patient Instructions (Signed)
 Visit Information  Thank you for taking time to visit with me today. Please don't hesitate to contact me if I can be of assistance to you before our next scheduled telephone appointment.  Our next appointment is by telephone on 3/13 at 10:30am  Following is a copy of your care plan:   Goals Addressed             This Visit's Progress    TOC Care Plan       Current Barriers:  Medication management review psych meds and side effects  Provider appointments with PCP  Home Health services Accept [facility] [phone]  Nursing and PT/OT SDOH (other) for help at home and caregiver stress  Care Coordination needs related to Limited social support and Cognitive Deficits  RNCM Clinical Goal(s):  Patient will with have virtual visit with PCP through collaboration with RN Care manager, provider, and care team.   Interventions: Evaluation of current treatment plan related to  self management and patient's adherence to plan as established by provider  Transitions of Care:  Goal on track:  Yes. Doctor Visits  - discussed the importance of doctor visits Contacted provider for patient needs Called PCP to request referral for [facility]  be resent - completed with [facility] 2/24 Contacted Health RN/OT/PT - Spoke with provider at [facility] ( new referral) to verify they accepted  patient  Post discharge activity limitations prescribed by provider reviewed Reviewed Signs and symptoms of infection  Patient Goals/Self-Care Activities: Participate in Transition of Care Program/Attend TOC scheduled calls Take all medications as prescribed Attend all scheduled provider appointments Call pharmacy for medication refills 3-7 days in advance of running out of medications Perform all self care activities independently  Perform IADL's (shopping, preparing meals, housekeeping, managing finances) independently Call provider office for new concerns or questions   Follow Up Plan:  Telephone follow up  appointment with care management team member scheduled for:  10/14/23 10:30am The patient has been provided with contact information for the care management team and has been advised to call with any health related questions or concerns.  Follow up with provider re: need for referral for [facility] to be resent            Medication review  Reviewed current home medications -- provided education as needed. Patient is aware of potential side effects and was encouraged to notify PCP for any adverse side effects or unwanted symptoms not relieved with interventions  Patient will call 911 for Medical Emergencies or Life -Threatening Symptoms.  Reviewed goals for care Patient/ Caregiver verbalizes understanding of instructions with the plan of care . The  Patient / Caregiver was encouraged to make informed decisions about care, actively participate in managing health conditions, and implement lifestyle changes as needed to promote independence and self-management of healthcare. SDOH screenings have been completed and addressed if indicted.  There are no reported barriers to care.    Follow-up Plan VBCI Case Management Nurse will provide follow-up and on-going assessment ,evaluation and education of disease processes, recommended interventions for both chronic and acute medical conditions ,  along with ongoing review of symptoms ,medication reviews / reconciliation during each weekly call . Any updates , inconsistencies, discrepancies or acute care concerns will be addressed and routed to the correct Practitioner if indicated   The patient has been provided with contact information for the care management team and has been advised to call with any health-related questions or concerns. Follow up call  with Care Team  as scheduled,or sooner should any new problems arise.  [facility]  Please call the care guide team at [phone]  if you need to cancel or reschedule your appointment . For  scheduled calls -Three attempts will be made to reach you -if the scheduled call is missed or  we are unable to reach the you after 3 attempts no additional outreach attempts will be made and the TOC follow-up will be closed .   If you need to speak to a Nurse you may  call me directly at the number below or if I am unavailable,and  your need is urgent  please call the main VBCI number at [phone] and ask to speak with one of the [facility] ( Transition of Care )  Nurses  .  Patient was encouraged to Contact PCP with any changes in baseline or  medication regimen,  changes in health status  /  well-being, safety concerns, including falls any questions or concerns regarding ongoing medical care, any difficulty obtaining or picking up prescriptions, any changes or worsening in condition- including  symptoms not relieved  with interventions                                                                            Additionally, If you experience worsening of your symptoms, develop shortness of breath, If you are experiencing a medical emergency,  develop suicidal or homicidal thoughts you must seek medical attention immediately by calling 911 or report to your local emergency department or urgent care.   If you have a non-emergency medical problem during routine business hours, please contact your provider's office and ask to speak with a nurse.       Please take the time to read instructions/literature along with the possible adverse reactions/side effects for all the Medicines that have been prescribed to you. Only take newly prescribed  Medications after you have completely understood and accept all the possible adverse reactions/side effects.   Do not take more than prescribed Medications for  Pain, Sleep and Anxiety. Do not drive when taking Pain medications or sleep aid/ insomnia  medications It is not advisable to combine anxiety, sleep and pain medications without talking with your primary care  practitioner    If you are experiencing a Mental Health or Behavioral Health Crisis or need someone to talk to Please call the Suicide and Crisis Lifeline: 988 You may also call the [place] National Suicide Prevention Lifeline: (365)626-9476 or TTY: 8451035841 TTY 314-427-0055) to talk to a trained counselor.  You may call the Behavioral Health Crisis Line at 717-700-7983, at any time, 24 hours a day, 7 days a week- however If you are in danger or need immediate medical attention, call 911.   If you would like help to quit smoking, call 1-800-QUIT-NOW ( 508-202-9117) OR Espaol: 1-855-Djelo-Ya (2-706-237-6283) o para ms informacin haga clic aqu or Text READY to 151-761 to register via text.   Susa Loffler , BSN, RN [place]   VBCI-Population Health RN Care Manager Direct Dial [phone]  Website: [email or web address]

## 2023-10-07 NOTE — Patient Outreach (Signed)
 Care Management  Transitions of Care Program Transitions of Care Post-discharge week 4   10/07/2023 Name: Brooke [name] MRN: [id number] DOB: 26-Sep-1945  Subjective: LISA-MARIE RUEGER is a 78 y.o. year old female who is a primary care patient of Evelene Croon, Atilano Median, MD. The Care Management team Engaged with patient Engaged with patient by telephone to assess and address transitions of care needs.   Consent to Services:  Patient was given information about care management services, agreed to services, and gave verbal consent to participate.   Assessment:   Patient/ Caregiver  voices no new complaints or concerns  and has not developed/ reported any new medical issues / Dx or acute changes. - since last follow-up call for most recent   Hospital stay    2/4-2/6  / 2025  No reported or reviewed Hospital Readmissions / No Urgent Care Visits / Transfers for Acute Change in condition .   MD/Specialist or Consultant visits reported since last follow-up call- Nine  PCP visit with Modena Nunnery  2/12 and next 3/19   Spoke with Daughter [name] Patient is Alert & O x 4 ,responsive, able to voice needs, cooperative with visit   There were no noted or reported cognitive/ mental status changes during this assessment. She does some periods of forgetfulness and mild cognitive impairment  General status reviewed  patient presents as expected for age and current situation with  finding as noted in previous sections of assessment. Unfortunately she is quite upset this week Her spouse of 58 yrs passed away unexpectedly 10/28/2023 His funeral is this weekend   Patient denies changes in ADL function or IADLs  at this time.   Patient mood, expressions, emotional tone, insight, judgement  and thought content as expected with no notable or reported change No reports of new onset or increased / uncontrolled pain, has chronic pain managed with medicinal and non-medicinal interventions.  No changes in skin, no rashes, wounds or  open areas  Diet reviewed and  education provided as needed to promote  healthy diet  Therapy Services  SN  / PT  /  OT  [facility] 2/24 visits held this week due to husband passing   Medication reconciliation/ review completed based on most recent medication list in EHR; and in review of recent Provider follow-up appointments- confirmed patient obtained / is taking all newly prescribed medications as instructed and is aware of any changes to previous medications regimen including dosage adjustments.  Reports  no new changes in medication/treatment  regimen  during this assessment period.  Her daughter is waiting to hear back from PCP office .She has unrelieved nausea and has requested Zofran  Medications and current treatments are effective for use intended. No adverse Drug reactions  reported No changes or modifications indicated at this time.   Caregiver manages  medications Patient is able to take medications without difficulty;  denies questions/ concerns and reports no barriers to medication adherence at this time   Caregiver educated on red flag s/s to watch for based on current discharge diagnosis and was encouraged to report, any changes in baseline or  medication regimen,   or any new unmanaged side effects or symptoms not relieved with interventions  to PCP and / or the  [facility] Case Management team .          SDOH Interventions    Flowsheet Row Telephone from 09/15/2023 in [facility] Office Visit from 05/12/2023 in [facility]  SDOH Interventions    Food Insecurity Interventions Intervention Not Indicated --  Housing Interventions Intervention Not Indicated --  Transportation Interventions Intervention Not Indicated, Patient Resources (Friends/Family) --  Utilities Interventions Intervention Not Indicated --  Depression Interventions/Treatment  -- Currently on Treatment        Goals Addressed             This Visit's  Progress    TOC Care Plan       Current Barriers:  Medication management review psych meds and side effects  Provider appointments with PCP  Home Health services Accept [facility] [phone]  Nursing and PT/OT SDOH (other) for help at home and caregiver stress  Care Coordination needs related to Limited social support and Cognitive Deficits  RNCM Clinical Goal(s):  Patient will with have virtual visit with PCP through collaboration with RN Care manager, provider, and care team.   Interventions: Evaluation of current treatment plan related to  self management and patient's adherence to plan as established by provider  Transitions of Care:  Goal on track:  Yes. Doctor Visits  - discussed the importance of doctor visits Contacted provider for patient needs Called PCP to request referral for [facility]  be resent - completed with [facility] 2/24 Contacted Health RN/OT/PT - Spoke with provider at [facility] ( new referral) to verify they accepted  patient  Post discharge activity limitations prescribed by provider reviewed Reviewed Signs and symptoms of infection  Patient Goals/Self-Care Activities: Participate in Transition of Care Program/Attend TOC scheduled calls Take all medications as prescribed Attend all scheduled provider appointments Call pharmacy for medication refills 3-7 days in advance of running out of medications Perform all self care activities independently  Perform IADL's (shopping, preparing meals, housekeeping, managing finances) independently Call provider office for new concerns or questions   Follow Up Plan:  Telephone follow up appointment with care management team member scheduled for:  10/14/23 10:30am The patient has been provided with contact information for the care management team and has been advised to call with any health related questions or concerns.  Follow up with provider re: need for referral for [facility] to be resent           Plan:  Amb referral for  Medication Compliance ( family request) Follow-up with Nurse and therapy visits from  [facility] next week Obtain new prescription for anti-nausea medication   Routine follow-up and on-going assessment evaluation and education of disease processes, recommended interventions for both chronic and acute medical conditions , will occur during each weekly visit along with ongoing review of symptoms ,medication reviews and reconciliation. Any updates , inconsistencies, discrepancies or acute care concerns will be addressed and routed to the correct Practitioner if indicated   Based on current information and Insurance plan -Reviewed benefits available to patient, including details about eligibility options for care if any area of needs were identified.  Reviewed patients ability to access and / or navigating the benefits system..Amb Referral made for Compliance packaging and expressed desire to change pharmacies  , refer to orders section of note for details   Please refer to Care Plan for goals and interventions -Effectiveness of interventions, symptom management and outcomes will be evaluated  weekly during [facility] 30-day Program Outreach calls  . Any necessary  changes and updates to Care Plan will be completed episodically    Reviewed goals for care Patient verbalizes understanding of instructions and care plan provided. Patient was encouraged to make informed decisions about their care, actively participate  in managing their health condition, and implement lifestyle changes as needed to promote independence and self-management of health care   Patient was encouraged to Contact PCP with any changes in baseline or  medication regimen,  changes in health status  /  well-being, safety concerns, including falls any questions or concerns regarding ongoing medical care, any difficulty obtaining or picking up prescriptions, any changes or worsening in condition- including  symptoms not relieved  with interventions     The patient has been provided with contact information for the care management team and has been advised to call with any health-related questions or concerns. Follow up call  with Care Team  as scheduled,or sooner should any new problems arise. The patient has been provided with contact information for the care management team and has been advised to call with any health related questions or concerns.   Susa Loffler , BSN, RN [facility]   VBCI-Population Health RN Care Manager Direct Dial [phone]  Fax: [phone] Website: [email or web address]

## 2023-10-07 NOTE — Telephone Encounter (Signed)
   Copied from CRM [id number]. Topic: Clinical - Prescription Issue >> Oct 07, 2023  9:17 AM Geroge Baseman wrote: Reason for CRM: Romeo Rabon, grand daughter. Promethazine is not helping the patients nausea, and it makes her way to sleepy. She is wondering if she can get zofran. Patients husband passed away and they are putting him to rest this Saturday and she's been really struggling with her nausea among everything else. Also noted thinks she has a uti and is planning on taking over the counter medication to see if it will work. Please call [phone] asap to  discuss zofran.

## 2023-10-07 NOTE — Telephone Encounter (Signed)
 Routing to provider to see if Zofran can be sent in instead for the patient.

## 2023-10-11 ENCOUNTER — Encounter (HOSPITAL_COMMUNITY): Payer: Self-pay

## 2023-10-11 ENCOUNTER — Other Ambulatory Visit (HOSPITAL_COMMUNITY): Payer: Self-pay

## 2023-10-12 ENCOUNTER — Ambulatory Visit (INDEPENDENT_AMBULATORY_CARE_PROVIDER_SITE_OTHER): Admitting: Nurse Practitioner

## 2023-10-12 ENCOUNTER — Encounter: Payer: Self-pay | Admitting: Nurse Practitioner

## 2023-10-12 ENCOUNTER — Telehealth: Payer: Self-pay

## 2023-10-12 VITALS — BP 163/104 | HR 69 | Ht 59.0 in | Wt 109.4 lb

## 2023-10-12 DIAGNOSIS — N390 Urinary tract infection, site not specified: Secondary | ICD-10-CM

## 2023-10-12 DIAGNOSIS — F419 Anxiety disorder, unspecified: Secondary | ICD-10-CM | POA: Diagnosis not present

## 2023-10-12 LAB — URINALYSIS, ROUTINE W REFLEX MICROSCOPIC
Bilirubin, UA: NEGATIVE
Glucose, UA: NEGATIVE
Ketones, UA: NEGATIVE
Nitrite, UA: POSITIVE — AB
RBC, UA: NEGATIVE
Specific Gravity, UA: 1.02 (ref 1.005–1.030)
Urobilinogen, Ur: 4 mg/dL — ABNORMAL HIGH (ref 0.2–1.0)
pH, UA: 6 (ref 5.0–7.5)

## 2023-10-12 LAB — MICROSCOPIC EXAMINATION

## 2023-10-12 MED ORDER — TRAZODONE HCL 100 MG PO TABS: 100.0000 mg | ORAL_TABLET | Freq: Every day | ORAL | 1 refills | Status: DC

## 2023-10-12 MED ORDER — LOPERAMIDE HCL 2 MG PO TABS
4.0000 mg | ORAL_TABLET | Freq: Four times a day (QID) | ORAL | 0 refills | Status: DC | PRN
Start: 2023-10-12 — End: 2023-11-03

## 2023-10-12 MED ORDER — CLONAZEPAM 1 MG PO TABS
1.0000 mg | ORAL_TABLET | Freq: Every day | ORAL | 0 refills | Status: DC
Start: 2023-10-12 — End: 2023-11-08

## 2023-10-12 MED ORDER — NITROFURANTOIN MONOHYD MACRO 100 MG PO CAPS: 100.0000 mg | ORAL_CAPSULE | Freq: Two times a day (BID) | ORAL | 0 refills | Status: DC

## 2023-10-12 NOTE — Progress Notes (Signed)
 Care Guide Pharmacy Note  10/12/2023 Name: Brooke [name] MRN: [id number] DOB: Dec 26, 1945  Referred By: Jackolyn Confer, MD Reason for referral: Care Coordination (Outreach to schedule with Pharm d )   Brooke [name] is a 78 y.o. year old female who is a primary care patient of Jackolyn Confer, MD.  Brooke [name] was referred to the pharmacist for assistance related to: COPD  Successful contact was made with the patient to discuss pharmacy services including being ready for the pharmacist to call at least 5 minutes before the scheduled appointment time and to have medication bottles and any blood pressure readings ready for review. The patient agreed to meet with the pharmacist via telephone visit on (date/time).10/19/2023  Penne Lash , RMA     [place]  [facility], [facility] Guide  Direct Dial: [phone]  Website: [email or web address]

## 2023-10-12 NOTE — Progress Notes (Unsigned)
 BP (!) 163/104 (BP Location: Right Arm, Patient Position: Sitting, Cuff Size: Normal)   Pulse 69   Ht 4\' 11"  (1.499 m)   Wt 109 lb 6.4 oz (49.6 kg)   SpO2 97%   BMI 22.10 kg/m    Subjective:    Patient ID: Brooke [name], female    DOB: Dec 03, 1945, 78 y.o.   MRN: [id number]  HPI: Brooke [name] is a 78 y.o. female  Chief Complaint  Patient presents with   Urinary Tract Infection   Diarrhea    Would like something stronger for diarrhea, happening a lot more than usual, still has appetite    URINARY SYMPTOMS Symptoms started at least 3 days ago.   Dysuria: yes Urinary frequency: yes Urgency: yes Small volume voids: no Symptom severity: no Urinary incontinence: no Foul odor: no Hematuria: no Abdominal pain: yes Back pain: no Suprapubic pain/pressure: yes Flank pain: no Fever:  no Vomiting: no Relief with cranberry juice: no Relief with pyridium: no Status: worse Previous urinary tract infection: no Recurrent urinary tract infection: no Sexual activity: No sexually active/monogomous/practicing safe sex History of sexually transmitted disease: no Penile discharge: no Treatments attempted: pyridium   Patient states she has been having trouble sleeping since her husband past.  She is not taking trazodone due to it making her feel bad in the morning.  She feels like her nerves are bad, especially in the evening.   Relevant past medical, surgical, family and social history reviewed and updated as indicated. Interim medical history since our last visit reviewed. Allergies and medications reviewed and updated.  Review of Systems  Constitutional:  Negative for fever.  Gastrointestinal:  Negative for abdominal pain and vomiting.  Genitourinary:  Positive for decreased urine volume, dysuria, frequency and urgency. Negative for flank pain and hematuria.  Musculoskeletal:  Negative for back pain.  Psychiatric/Behavioral:  Positive for dysphoric mood and sleep disturbance.  The patient is nervous/anxious.     Per HPI unless specifically indicated above     Objective:    BP (!) 163/104 (BP Location: Right Arm, Patient Position: Sitting, Cuff Size: Normal)   Pulse 69   Ht 4\' 11"  (1.499 m)   Wt 109 lb 6.4 oz (49.6 kg)   SpO2 97%   BMI 22.10 kg/m   Wt Readings from Last 3 Encounters:  10/12/23 109 lb 6.4 oz (49.6 kg)  07/22/23 116 lb 12.8 oz (53 kg)  06/24/23 117 lb 6.4 oz (53.3 kg)    Physical Exam Vitals and nursing note reviewed.  Constitutional:      General: She is not in acute distress.    Appearance: Normal appearance. She is normal weight. She is not ill-appearing, toxic-appearing or diaphoretic.  HENT:     Head: Normocephalic.     Right Ear: External ear normal.     Left Ear: External ear normal.     Nose: Nose normal.     Mouth/Throat:     Mouth: Mucous membranes are moist.     Pharynx: Oropharynx is clear.  Eyes:     General:        Right eye: No discharge.        Left eye: No discharge.     Extraocular Movements: Extraocular movements intact.     Conjunctiva/sclera: Conjunctivae normal.     Pupils: Pupils are equal, round, and reactive to light.  Cardiovascular:     Rate and Rhythm: Normal rate and regular rhythm.     Heart sounds: No  murmur heard. Pulmonary:     Effort: Pulmonary effort is normal. No respiratory distress.     Breath sounds: Normal breath sounds. No wheezing or rales.  Abdominal:     General: Abdomen is flat. Bowel sounds are normal. There is no distension.     Palpations: Abdomen is soft. There is no mass.     Tenderness: There is no abdominal tenderness. There is no right CVA tenderness, left CVA tenderness, guarding or rebound.     Hernia: No hernia is present.  Musculoskeletal:     Cervical back: Normal range of motion and neck supple.  Skin:    General: Skin is warm and dry.     Capillary Refill: Capillary refill takes less than 2 seconds.  Neurological:     General: No focal deficit present.      Mental Status: She is alert and oriented to person, place, and time. Mental status is at baseline.  Psychiatric:        Mood and Affect: Mood normal.        Behavior: Behavior normal.        Thought Content: Thought content normal.        Judgment: Judgment normal.     Results for orders placed or performed in visit on 10/12/23  Microscopic Examination   Collection Time: 10/12/23  3:10 PM   Urine  Result Value Ref Range   WBC, UA 6-10 (A) 0 - 5 /hpf   RBC, Urine 0-2 0 - 2 /hpf   Epithelial Cells (non renal) 0-10 0 - 10 /hpf   Mucus, UA Present (A) Not Estab.   Bacteria, UA Few (A) None seen/Few  Urinalysis, Routine w reflex microscopic   Collection Time: 10/12/23  3:10 PM  Result Value Ref Range   Specific Gravity, UA 1.020 1.005 - 1.030   pH, UA 6.0 5.0 - 7.5   Color, UA Yellow Yellow   Appearance Ur Hazy (A) Clear   Leukocytes,UA 1+ (A) Negative   Protein,UA Trace (A) Negative/Trace   Glucose, UA Negative Negative   Ketones, UA Negative Negative   RBC, UA Negative Negative   Bilirubin, UA Negative Negative   Urobilinogen, Ur 4.0 (H) 0.2 - 1.0 mg/dL   Nitrite, UA Positive (A) Negative   Microscopic Examination See below:       Assessment & Plan:   Problem List Items Addressed This Visit       Other   Anxiety   Chronic.  Recently exacerbated with loss of her husband.  Feels like it is worse in the evening.  Will increase dose of Klonopin in the evenings to 1mg .  Okay to continue with 0.5mg  dose during the day.  Reviewed this with patient and daughter at visit.  Follow up in 2 weeks with PCP.      Other Visit Diagnoses       Urinary tract infection without hematuria, site unspecified    -  Primary   Will treat with macrobid.  Increase water intake.  Recommend decreasing sprite intake. Okay to use AZO. Will send urine for culture and adjust regimen as needed   Relevant Medications   nitrofurantoin, macrocrystal-monohydrate, (MACROBID) 100 MG capsule   Other  Relevant Orders   Urinalysis, Routine w reflex microscopic (Completed)   Urine Culture        Follow up plan: No follow-ups on file.   A total of 30 minutes were spent on this encounter today.  When total time is documented, this includes  both the face-to-face and non-face-to-face time personally spent before, during and after the visit on the date of the encounter symptoms, plan of care, medications, and follow up.

## 2023-10-13 ENCOUNTER — Ambulatory Visit: Payer: Self-pay | Admitting: *Deleted

## 2023-10-13 DIAGNOSIS — N390 Urinary tract infection, site not specified: Secondary | ICD-10-CM | POA: Diagnosis not present

## 2023-10-13 NOTE — Assessment & Plan Note (Signed)
 Chronic.  Recently exacerbated with loss of her husband.  Feels like it is worse in the evening.  Will increase dose of Klonopin in the evenings to 1mg .  Okay to continue with 0.5mg  dose during the day.  Reviewed this with patient and daughter at visit.  Follow up in 2 weeks with PCP.

## 2023-10-13 NOTE — Patient Outreach (Signed)
 Care Coordination   10/13/2023 Name: Brooke [name] MRN: [id number] DOB: [birth year]   Care Coordination Outreach Attempts:  An unsuccessful telephone outreach was attempted today to offer the patient information about available complex care management services.  Follow Up Plan:  Additional outreach attempts will be made to offer the patient complex care management information and services.   Encounter Outcome:  No Answer   Care Coordination Interventions:  No, not indicated      Jaymien Landin, LCSW [place]  [facility], [facility] Licensed Clinical Social Worker Care Coordinator  Direct Dial: [phone]

## 2023-10-14 ENCOUNTER — Ambulatory Visit: Payer: Self-pay | Admitting: *Deleted

## 2023-10-14 ENCOUNTER — Other Ambulatory Visit: Payer: Self-pay

## 2023-10-14 DIAGNOSIS — E871 Hypo-osmolality and hyponatremia: Secondary | ICD-10-CM | POA: Diagnosis not present

## 2023-10-14 DIAGNOSIS — J449 Chronic obstructive pulmonary disease, unspecified: Secondary | ICD-10-CM | POA: Diagnosis not present

## 2023-10-14 DIAGNOSIS — G500 Trigeminal neuralgia: Secondary | ICD-10-CM | POA: Diagnosis not present

## 2023-10-14 DIAGNOSIS — U071 COVID-19: Secondary | ICD-10-CM | POA: Diagnosis not present

## 2023-10-14 NOTE — Patient Instructions (Addendum)
 Visit Information  Thank you for taking time to visit with me today. Please don't hesitate to contact me if I can be of assistance to you.   Assessment:   Patient/ Caregiver  voices no new complaints or concerns  and has not developed/ reported any new medical issues / Dx or acute changes. - since last follow-up call for most recent   Hospital stay    2/4-2/6 / 2025  No reported or reviewed Hospital Readmissions / No Urgent Care Visits / Transfers for Acute Change in condition .   MD/Specialist or Consultant visits reported since last follow-up call  VBCI Call with SW 3/13 She has a VBCI Pharmacy call scheduled for 3/18 for polypharmacy, Medication compliance and COPD management  She has a Cardiology  and PCP follow-up next week and her daughter will schedule GI follow-up after those visits   Patient was Alert & O x 3 ,responsive, able to voice needs, cooperative with visit. She does have some mild cognitive impairment and has 24 hr oversight    There were no noted or reported cognitive/ mental status changes during this assessment. Per her daughter [name] she has moved the patient in with her for increased oversight and assistance . She will have 24/7 oversight with strong family support  General status reviewed  patient presents as expected for age and current situation with  finding as noted in previous sections of assessment  Patient denies changes in ADL function or IADLs  at this time. She continues to have generalized weakness and deconditioning and will begin therapy services this week   Patient mood, expressions, emotional tone, insight, judgement and thought content as expected with no notable or reported change No reports of new onset or increased / uncontrolled pain, has chronic pain managed with medicinal and non-medicinal interventions.  No changes in skin, no rashes, wounds or open areas  Diet reviewed and education provided as needed to promote good lifestyle choices and healthy  meals. Such- as eating plenty of whole grains, nuts, vegetables, and fruits, lean meats, poultry, fish, beans, eggs, and nuts sizes (Unless otherwise restricted) Limit saturated and trans fats, sodium, and added sugars. Make sure to drink plenty of fluids every day. Be conscious of  portion sizes  (if indicated- Follow any recommended Fluid restrictions )  Therapy Services  SN  / PT  /  OT - She has a SOC visit with [facility] 10/14/23 @ 1:00pm    Medication reconciliation/ review completed based on most recent medication list in EHR; and in review of recent Provider follow-up appointments- confirmed patient obtained / is taking all newly prescribed medications as instructed and is aware of any changes to previous medications regimen including dosage adjustments.  Reports  new changes in medication regimen/ treatment plan during this review period.  Changes noted- after PCP visit new orders to increase dose of Klonopin in the evenings to 1mg . Okay to continue with 0.5mg  dose during the day.  Medications and current treatments are effective for use intended. No adverse Drug reactions  reported No changes or modifications indicated at this time.   Patient Caregiver manages  medications Patient is able to take medications without difficulty;  denies questions/ concerns and reports no barriers to medication adherence at this time   Patient &  Caregiver educated on red flag s/s to watch for based on current discharge diagnosis and was encouraged to report, any changes in baseline or  medication regimen,   or any new unmanaged side effects  or symptoms not relieved with interventions  to PCP and / or the  VBCI Case Management team .   Following is a copy of your care plan:   Goals Addressed             This Visit's Progress    COMPLETED: TOC Care Plan       Current Barriers:  Medication management review psych meds and side effects  Provider appointments with PCP  Home Health services [facility] [phone]  Nursing and PT/OT [facility] 3/13 SDOH (other) for help at home and caregiver stress- Followed by VBCI SW successful call  10/14/23  Care Coordination needs related to Limited social support and Cognitive Deficits  RNCM Clinical Goal(s):  Patient will with have virtual visit with PCP through collaboration with RN Care manager, provider, and care team.   Interventions: Evaluation of current treatment plan related to  self management and patient's adherence to plan as established by provider  Transitions of Care:  Goal Met. Doctor Visits  - discussed the importance of doctor visits Contacted provider for patient needs Called PCP to request referral for [facility]  be resent - completed with [facility] 2/24 Contacted Health RN/OT/PT - Spoke with provider at [facility] ( new referral) to verify they accepted  patient  Post discharge activity limitations prescribed by provider reviewed Reviewed Signs and symptoms of infection  Patient Goals/Self-Care Activities: Participate in Transition of Care Program/Attend TOC scheduled calls Take all medications as prescribed Attend all scheduled provider appointments Call pharmacy for medication refills 3-7 days in advance of running out of medications Perform all self care activities independently  Perform IADL's (shopping, preparing meals, housekeeping, managing finances) independently Call provider office for new concerns or questions  Work with the social worker to address care coordination needs and will continue to work with the clinical team to address health care and disease management related needs  Follow Up Plan:  The patient has been provided with contact information for the care management team and has been advised to call with any health related questions or concerns.  No further follow up required: Program Completion           Medication review  Reviewed current home medications -- provided education as needed. Patient is aware of  potential side effects and was encouraged to notify PCP for any adverse side effects or unwanted symptoms not relieved with interventions  Patient will call 911 for Medical Emergencies or Life -Threatening Symptoms.  Reviewed goals for care Patient/ Caregiver verbalizes understanding of instructions with the plan of care . The  Patient / Caregiver was encouraged to make informed decisions about care, actively participate in managing health conditions, and implement lifestyle changes as needed to promote independence and self-management of healthcare. SDOH screenings have been completed and addressed if indicted.  There are no reported barriers to care.    Follow-up Plan The patient has successfully completed the 30-day TOC Program. Condition is stable No further acute needs identified at this time. Chronic conditions and ongoing care is  managed thru collaboration with  PCP,  Specialists and additional Healthcare Providers if indicated . Patient verbalized understanding of ongoing plan of care.    The patient has been provided with contact information for the care management team and has been advised to call with any health-related questions or concerns. Follow up call  with Care Team  as scheduled,or sooner should any new problems arise.  Additionally, If you experience worsening of your symptoms, develop shortness of breath, If you are experiencing a medical emergency,  develop suicidal or homicidal thoughts you must seek medical attention immediately by calling 911 or report to your local emergency department or urgent care.   If you have a non-emergency medical problem during routine business hours, please contact your provider's office and ask to speak with a nurse.       Please take the time to read instructions/literature along with the possible adverse reactions/side effects for all the Medicines that have been  prescribed to you. Only take newly prescribed  Medications after you have completely understood and accept all the possible adverse reactions/side effects.   Do not take more than prescribed Medications for  Pain, Sleep and Anxiety. Do not drive when taking Pain medications or sleep aid/ insomnia  medications It is not advisable to combine anxiety, sleep and pain medications without talking with your primary care practitioner    If you are experiencing a Mental Health or Behavioral Health Crisis or need someone to talk to Please call the Suicide and Crisis Lifeline: 988 You may also call the [place] National Suicide Prevention Lifeline: (475)053-5369 or TTY: 587 670 3306 TTY 902 796 5501) to talk to a trained counselor.  You may call the Behavioral Health Crisis Line at 249-869-3338, at any time, 24 hours a day, 7 days a week- however If you are in danger or need immediate medical attention, call 911.   If you would like help to quit smoking, call 1-800-QUIT-NOW ( 720-306-1492) OR Espaol: 1-855-Djelo-Ya (9-518-841-6606) o para ms informacin haga clic aqu or Text READY to 301-601 to register via text.   Susa Loffler , BSN, RN [place]   VBCI-Population Health RN Care Manager Direct Dial [phone]  Website: [email or web address]

## 2023-10-14 NOTE — Patient Outreach (Signed)
 Care Management  Transitions of Care Program Transitions of Care Post-discharge Completion of 30 day Program week 5    10/14/2023 Name: Brooke [name] MRN: [id number] DOB: February 21, 1946  Subjective: Brooke [name] is a 78 y.o. year old female who is a primary care patient of [name], MD. The Care Management team Engaged with patient Engaged with patient by telephone to assess and address transitions of care needs.   Consent to Services:  Patient was given information about care management services, agreed to services, and gave verbal consent to participate.   Assessment:   Patient/ Caregiver  voices no new complaints or concerns  and has not developed/ reported any new medical issues / Dx or acute changes. - since last follow-up call for most recent   Hospital stay    2/4-2/6 / 2025  No reported or reviewed Hospital Readmissions / No Urgent Care Visits / Transfers for Acute Change in condition .   MD/Specialist or Consultant visits reported since last follow-up call  VBCI Call with SW 3/13 She has a VBCI Pharmacy call scheduled for 3/18 for polypharmacy, Medication compliance and COPD management  She has a Cardiology  and PCP follow-up next week and her daughter will schedule GI follow-up after those visits   Patient was Alert & O x 3 ,responsive, able to voice needs, cooperative with visit. She does have some mild cognitive impairment and has 24 hr oversight    There were no noted or reported cognitive/ mental status changes during this assessment. Per her daughter [name] she has moved the patient in with her for increased oversight and assistance . She will have 24/7 oversight with strong family support  General status reviewed  patient presents as expected for age and current situation with  finding as noted in previous sections of assessment  Patient denies changes in ADL function or IADLs  at this time. She continues to have generalized weakness and deconditioning and will begin  therapy services this week   Patient mood, expressions, emotional tone, insight, judgement and thought content as expected with no notable or reported change No reports of new onset or increased / uncontrolled pain, has chronic pain managed with medicinal and non-medicinal interventions.  No changes in skin, no rashes, wounds or open areas  Diet reviewed and education provided as needed to promote good lifestyle choices and healthy meals. Such- as eating plenty of whole grains, nuts, vegetables, and fruits, lean meats, poultry, fish, beans, eggs, and nuts sizes (Unless otherwise restricted) Limit saturated and trans fats, sodium, and added sugars. Make sure to drink plenty of fluids every day. Be conscious of  portion sizes  (if indicated- Follow any recommended Fluid restrictions )  Therapy Services  SN  / PT  /  OT - She has a SOC visit with [facility] 10/14/23 @ 1:00pm    Medication reconciliation/ review completed based on most recent medication list in EHR; and in review of recent Provider follow-up appointments- confirmed patient obtained / is taking all newly prescribed medications as instructed and is aware of any changes to previous medications regimen including dosage adjustments.  Reports  new changes in medication regimen/ treatment plan during this review period.  Changes noted- after PCP visit new orders to increase dose of Klonopin in the evenings to 1mg . Okay to continue with 0.5mg  dose during the day.  Medications and current treatments are effective for use intended. No adverse Drug reactions  reported No changes or modifications indicated at this time.  Patient Caregiver manages  medications Patient is able to take medications without difficulty;  denies questions/ concerns and reports no barriers to medication adherence at this time   Patient &  Caregiver educated on red flag s/s to watch for based on current discharge diagnosis and was encouraged to report, any changes in  baseline or  medication regimen,   or any new unmanaged side effects or symptoms not relieved with interventions  to PCP and / or the  VBCI Case Management team .          SDOH Interventions    Flowsheet Row Care Coordination from 10/14/2023 in [facility] Care Coordination Telephone from 09/15/2023 in [place] [facility] Office Visit from 05/12/2023 in [facility]  SDOH Interventions     Food Insecurity Interventions Intervention Not Indicated Intervention Not Indicated --  Housing Interventions Intervention Not Indicated Intervention Not Indicated --  Transportation Interventions Intervention Not Indicated, Patient Resources (Friends/Family) Intervention Not Indicated, Patient Resources (Friends/Family) --  Utilities Interventions Intervention Not Indicated Intervention Not Indicated --  Depression Interventions/Treatment  Medication -- Currently on Treatment  Social Connections Interventions Intervention Not Indicated -- --        Goals Addressed             This Visit's Progress    COMPLETED: TOC Care Plan       Current Barriers:  Medication management review psych meds and side effects  Provider appointments with PCP  Home Health services [facility] [phone]  Nursing and PT/OT [facility] 3/13 SDOH (other) for help at home and caregiver stress- Followed by VBCI SW successful call  10/14/23  Care Coordination needs related to Limited social support and Cognitive Deficits  RNCM Clinical Goal(s):  Patient will with have virtual visit with PCP through collaboration with RN Care manager, provider, and care team.   Interventions: Evaluation of current treatment plan related to  self management and patient's adherence to plan as established by provider  Transitions of Care:  Goal Met. Doctor Visits  - discussed the importance of doctor visits Contacted provider for patient needs Called PCP to request  referral for [facility]  be resent - completed with [facility] 2/24 Contacted Health RN/OT/PT - Spoke with provider at [facility] ( new referral) to verify they accepted  patient  Post discharge activity limitations prescribed by provider reviewed Reviewed Signs and symptoms of infection  Patient Goals/Self-Care Activities: Participate in Transition of Care Program/Attend TOC scheduled calls Take all medications as prescribed Attend all scheduled provider appointments Call pharmacy for medication refills 3-7 days in advance of running out of medications Perform all self care activities independently  Perform IADL's (shopping, preparing meals, housekeeping, managing finances) independently Call provider office for new concerns or questions  Work with the social worker to address care coordination needs and will continue to work with the clinical team to address health care and disease management related needs  Follow Up Plan:  The patient has been provided with contact information for the care management team and has been advised to call with any health related questions or concerns.  No further follow up required: Program Completion           Plan:   The patient has successfully completed the 30-day TOC Program. Condition is stable No further acute needs identified at this time. Chronic conditions and ongoing care is  managed thru collaboration with  PCP,  Specialists and additional Healthcare Providers if indicated . Patient  verbalized understanding of ongoing plan of care.  SDOH needs have been screened and interventions provided if identified.  Reviewed current home medications -- provided education as needed.  Previously discussed rationale of use, how/when to take medications. Patient is aware of potential side effects, and was encouraged to notify PCP for any changes in condition or signs / symptoms not relieved  with interventions.   Patient will call 911 for Medical Emergencies or Life -Threatening  or report to a local emergency department or urgent care.   Patient was encouraged to Contact PCP  with any questions or concerns regarding ongoing  medical care, any  difficulty obtaining or picking up  prescriptions, any  changes or  worsening in  condition including signs / symptoms not relieved  with interventions Follow up as indicated with the  Care Team , or sooner should any new problems arise.   Patient had no additional questions or concerns at this time. Current needs addressed.    The patient has been provided with contact information for the care management team and has been advised to call with any health related questions or concerns.  Program Completion   Susa Loffler , BSN, RN [facility]   VBCI-Population Health RN Care Manager Direct Dial [phone]  Fax: [phone] Website: [email or web address]

## 2023-10-14 NOTE — Patient Instructions (Signed)
 Visit Information  Thank you for taking time to visit with me today. Please don't hesitate to contact me if I can be of assistance to you.   Following are the goals we discussed today:   Goals Addressed             This Visit's Progress    care coordination activities         EMOTIONAL / MENTAL HEALTH SUPPORT Keep all upcoming appointment discussed today Continue with compliance of taking medication prescribed by Doctor Self Support options  (Continue to consider grief counseling for ongoing grief support )         Our next appointment is by telephone on 10/28/23 at 10am  Please call the care guide team at [phone] if you need to cancel or reschedule your appointment.   If you are experiencing a Mental Health or Behavioral Health Crisis or need someone to talk to, please call 911   Patient verbalizes understanding of instructions and care plan provided today and agrees to view in MyChart. Active MyChart status and patient understanding of how to access instructions and care plan via MyChart confirmed with patient.     Telephone follow up appointment with care management team member scheduled for: 10/28/23  Verna Czech, LCSW [place]  [facility], [facility] Licensed Clinical Social Worker Care Coordinator  Direct Dial: [phone]

## 2023-10-14 NOTE — Patient Outreach (Signed)
 Care Coordination   Initial Visit Note   10/14/2023 Name: Brooke [name] MRN: [id number] DOB: 1945/08/21  Brooke [name] is a 78 y.o. year old female who sees Brooke [name], Brooke Median, MD for primary care. I spoke with  Brooke [name] 's daughter by phone today.  What matters to the patients health and wellness today?  [name] confirmed that patient's spouse recently passed away. Patient now resides with daughter, patient adjusting to the move. Describes normal grief response to the death of her spouse,  declines grief counseling at this time, however CSW will continue to follow for needs due to the recent death of patient's spouse and transition to daughter's home.   Goals Addressed             This Visit's Progress    care coordination activities         EMOTIONAL / MENTAL HEALTH SUPPORT Keep all upcoming appointment discussed today Continue with compliance of taking medication prescribed by Doctor Self Support options  (Continue to consider grief counseling for ongoing grief support )         SDOH assessments and interventions completed:  Yes  SDOH Interventions Today    Flowsheet Row Most Recent Value  SDOH Interventions   Food Insecurity Interventions Intervention Not Indicated  Housing Interventions Intervention Not Indicated  Transportation Interventions Intervention Not Indicated, Patient Resources (Friends/Family)  Utilities Interventions Intervention Not Indicated  Depression Interventions/Treatment  Medication  Social Connections Interventions Intervention Not Indicated        Care Coordination Interventions:  Yes, provided  Interventions Today    Flowsheet Row Most Recent Value  Chronic Disease   Chronic disease during today's visit Chronic Obstructive Pulmonary Disease (COPD), Other  [Polypharmacy -Anxiety-Major depressive disorder with single episode, in full remission Generalized weakness]  General Interventions   General Interventions  Discussed/Reviewed General Interventions Discussed, [employer], Level of Care  [Patient assessed for [employer] health needs.  Spouse passed away 3/2. Patient has moved in witth daughter 2 days ago , daughter supportive but may need additional emotional support and possible some help at home]  Level of Care Personal Care Services  [confirmed that daughter]  Exercise Interventions   Exercise Discussed/Reviewed Exercise Discussed  [PT/OT ordered, RN to come out today]  Mental Health Interventions   Mental Health Discussed/Reviewed Mental Health Discussed, Coping Strategies, Grief and Loss  [declines grief counseling at this time, will speak to preacher, provided with grief support, decreased stages of grief.]  Pharmacy Interventions   Pharmacy Dicussed/Reviewed Pharmacy Topics Discussed  [Pharmacy 3/18-medicaton management-will discuss pill packaging]  Safety Interventions   Safety Discussed/Reviewed Safety Discussed, Fall Risk  [took up throw rugs to avoid falls, will plan to install a railing on front porch steps,  also has a transfer bench in shower as well]  Advanced Directive Interventions   Advanced Directives Discussed/Reviewed Advanced Directives Discussed  [daughter has HPOA and is on patient's bank accounts at the bank-Advanced Directive completed]       Follow up plan: Follow up call scheduled for 10/28/23    Encounter Outcome:  Patient Visit Completed

## 2023-10-16 ENCOUNTER — Other Ambulatory Visit: Payer: Self-pay

## 2023-10-16 ENCOUNTER — Inpatient Hospital Stay

## 2023-10-16 ENCOUNTER — Emergency Department

## 2023-10-16 ENCOUNTER — Inpatient Hospital Stay
Admission: EM | Admit: 2023-10-16 | Discharge: 2023-10-19 | DRG: 280 | Disposition: A | Discharge status: Home Health | Attending: Obstetrics and Gynecology | Admitting: Obstetrics and Gynecology

## 2023-10-16 DIAGNOSIS — I340 Nonrheumatic mitral (valve) insufficiency: Secondary | ICD-10-CM | POA: Diagnosis not present

## 2023-10-16 DIAGNOSIS — I700 Atherosclerosis of aorta: Secondary | ICD-10-CM | POA: Diagnosis not present

## 2023-10-16 DIAGNOSIS — R0989 Other specified symptoms and signs involving the circulatory and respiratory systems: Secondary | ICD-10-CM | POA: Diagnosis not present

## 2023-10-16 DIAGNOSIS — R6889 Other general symptoms and signs: Secondary | ICD-10-CM | POA: Diagnosis not present

## 2023-10-16 DIAGNOSIS — E876 Hypokalemia: Secondary | ICD-10-CM | POA: Insufficient documentation

## 2023-10-16 DIAGNOSIS — K5900 Constipation, unspecified: Secondary | ICD-10-CM | POA: Diagnosis not present

## 2023-10-16 DIAGNOSIS — E611 Iron deficiency: Secondary | ICD-10-CM | POA: Insufficient documentation

## 2023-10-16 DIAGNOSIS — E785 Hyperlipidemia, unspecified: Secondary | ICD-10-CM | POA: Diagnosis present

## 2023-10-16 DIAGNOSIS — Z8616 Personal history of COVID-19: Secondary | ICD-10-CM | POA: Diagnosis not present

## 2023-10-16 DIAGNOSIS — R7989 Other specified abnormal findings of blood chemistry: Secondary | ICD-10-CM | POA: Diagnosis not present

## 2023-10-16 DIAGNOSIS — I2510 Atherosclerotic heart disease of native coronary artery without angina pectoris: Secondary | ICD-10-CM | POA: Diagnosis present

## 2023-10-16 DIAGNOSIS — I10 Essential (primary) hypertension: Secondary | ICD-10-CM

## 2023-10-16 DIAGNOSIS — U071 COVID-19: Secondary | ICD-10-CM | POA: Diagnosis not present

## 2023-10-16 DIAGNOSIS — I214 Non-ST elevation (NSTEMI) myocardial infarction: Principal | ICD-10-CM

## 2023-10-16 DIAGNOSIS — J4489 Other specified chronic obstructive pulmonary disease: Secondary | ICD-10-CM | POA: Diagnosis not present

## 2023-10-16 DIAGNOSIS — F32A Depression, unspecified: Secondary | ICD-10-CM | POA: Diagnosis present

## 2023-10-16 DIAGNOSIS — J449 Chronic obstructive pulmonary disease, unspecified: Secondary | ICD-10-CM | POA: Diagnosis present

## 2023-10-16 DIAGNOSIS — I2489 Other forms of acute ischemic heart disease: Secondary | ICD-10-CM

## 2023-10-16 DIAGNOSIS — E871 Hypo-osmolality and hyponatremia: Secondary | ICD-10-CM | POA: Diagnosis present

## 2023-10-16 DIAGNOSIS — I252 Old myocardial infarction: Secondary | ICD-10-CM

## 2023-10-16 DIAGNOSIS — Z66 Do not resuscitate: Secondary | ICD-10-CM | POA: Diagnosis present

## 2023-10-16 DIAGNOSIS — R9389 Abnormal findings on diagnostic imaging of other specified body structures: Secondary | ICD-10-CM | POA: Diagnosis not present

## 2023-10-16 DIAGNOSIS — I493 Ventricular premature depolarization: Secondary | ICD-10-CM | POA: Diagnosis not present

## 2023-10-16 DIAGNOSIS — L8990 Pressure ulcer of unspecified site, unspecified stage: Secondary | ICD-10-CM | POA: Insufficient documentation

## 2023-10-16 DIAGNOSIS — M25511 Pain in right shoulder: Secondary | ICD-10-CM | POA: Diagnosis present

## 2023-10-16 DIAGNOSIS — K589 Irritable bowel syndrome without diarrhea: Secondary | ICD-10-CM | POA: Diagnosis present

## 2023-10-16 DIAGNOSIS — M48061 Spinal stenosis, lumbar region without neurogenic claudication: Secondary | ICD-10-CM | POA: Diagnosis not present

## 2023-10-16 DIAGNOSIS — I7121 Aneurysm of the ascending aorta, without rupture: Secondary | ICD-10-CM | POA: Diagnosis not present

## 2023-10-16 DIAGNOSIS — Z882 Allergy status to sulfonamides status: Secondary | ICD-10-CM

## 2023-10-16 DIAGNOSIS — Z1152 Encounter for screening for COVID-19: Secondary | ICD-10-CM | POA: Diagnosis not present

## 2023-10-16 DIAGNOSIS — R531 Weakness: Principal | ICD-10-CM

## 2023-10-16 DIAGNOSIS — R5381 Other malaise: Secondary | ICD-10-CM | POA: Diagnosis present

## 2023-10-16 DIAGNOSIS — M47816 Spondylosis without myelopathy or radiculopathy, lumbar region: Secondary | ICD-10-CM | POA: Diagnosis not present

## 2023-10-16 DIAGNOSIS — K449 Diaphragmatic hernia without obstruction or gangrene: Secondary | ICD-10-CM | POA: Diagnosis not present

## 2023-10-16 DIAGNOSIS — I672 Cerebral atherosclerosis: Secondary | ICD-10-CM | POA: Diagnosis not present

## 2023-10-16 DIAGNOSIS — N3090 Cystitis, unspecified without hematuria: Secondary | ICD-10-CM

## 2023-10-16 DIAGNOSIS — J310 Chronic rhinitis: Secondary | ICD-10-CM | POA: Insufficient documentation

## 2023-10-16 DIAGNOSIS — I959 Hypotension, unspecified: Secondary | ICD-10-CM | POA: Insufficient documentation

## 2023-10-16 DIAGNOSIS — G9341 Metabolic encephalopathy: Secondary | ICD-10-CM | POA: Diagnosis not present

## 2023-10-16 DIAGNOSIS — Z888 Allergy status to other drugs, medicaments and biological substances status: Secondary | ICD-10-CM

## 2023-10-16 DIAGNOSIS — Z634 Disappearance and death of family member: Secondary | ICD-10-CM

## 2023-10-16 DIAGNOSIS — I517 Cardiomegaly: Secondary | ICD-10-CM | POA: Diagnosis not present

## 2023-10-16 DIAGNOSIS — R918 Other nonspecific abnormal finding of lung field: Secondary | ICD-10-CM | POA: Diagnosis not present

## 2023-10-16 DIAGNOSIS — R0689 Other abnormalities of breathing: Secondary | ICD-10-CM | POA: Diagnosis not present

## 2023-10-16 DIAGNOSIS — Z79899 Other long term (current) drug therapy: Secondary | ICD-10-CM

## 2023-10-16 DIAGNOSIS — E861 Hypovolemia: Secondary | ICD-10-CM | POA: Diagnosis present

## 2023-10-16 DIAGNOSIS — F411 Generalized anxiety disorder: Secondary | ICD-10-CM | POA: Diagnosis present

## 2023-10-16 DIAGNOSIS — I5181 Takotsubo syndrome: Secondary | ICD-10-CM | POA: Diagnosis present

## 2023-10-16 DIAGNOSIS — Z8249 Family history of ischemic heart disease and other diseases of the circulatory system: Secondary | ICD-10-CM

## 2023-10-16 DIAGNOSIS — I272 Pulmonary hypertension, unspecified: Secondary | ICD-10-CM | POA: Diagnosis not present

## 2023-10-16 DIAGNOSIS — I350 Nonrheumatic aortic (valve) stenosis: Secondary | ICD-10-CM | POA: Diagnosis not present

## 2023-10-16 DIAGNOSIS — G500 Trigeminal neuralgia: Secondary | ICD-10-CM

## 2023-10-16 DIAGNOSIS — J90 Pleural effusion, not elsewhere classified: Secondary | ICD-10-CM | POA: Diagnosis not present

## 2023-10-16 DIAGNOSIS — G8929 Other chronic pain: Secondary | ICD-10-CM | POA: Diagnosis present

## 2023-10-16 DIAGNOSIS — Z90711 Acquired absence of uterus with remaining cervical stump: Secondary | ICD-10-CM

## 2023-10-16 DIAGNOSIS — Z7982 Long term (current) use of aspirin: Secondary | ICD-10-CM

## 2023-10-16 DIAGNOSIS — M5125 Other intervertebral disc displacement, thoracolumbar region: Secondary | ICD-10-CM | POA: Diagnosis not present

## 2023-10-16 DIAGNOSIS — M5135 Other intervertebral disc degeneration, thoracolumbar region: Secondary | ICD-10-CM | POA: Diagnosis not present

## 2023-10-16 DIAGNOSIS — D509 Iron deficiency anemia, unspecified: Secondary | ICD-10-CM

## 2023-10-16 HISTORY — DX: Essential (primary) hypertension: I10

## 2023-10-16 LAB — IRON AND TIBC
Iron: 23 ug/dL — ABNORMAL LOW (ref 28–170)
Saturation Ratios: 12 % (ref 10.4–31.8)
TIBC: 190 ug/dL — ABNORMAL LOW (ref 250–450)
UIBC: 167 ug/dL

## 2023-10-16 LAB — URINALYSIS, ROUTINE W REFLEX MICROSCOPIC
Bilirubin Urine: NEGATIVE
Glucose, UA: NEGATIVE mg/dL
Hgb urine dipstick: NEGATIVE
Ketones, ur: 5 mg/dL — AB
Leukocytes,Ua: NEGATIVE
Nitrite: NEGATIVE
Protein, ur: NEGATIVE mg/dL
Specific Gravity, Urine: 1.017 (ref 1.005–1.030)
pH: 5 (ref 5.0–8.0)

## 2023-10-16 LAB — HEPATIC FUNCTION PANEL
ALT: 13 U/L (ref 0–44)
AST: 20 U/L (ref 15–41)
Albumin: 2.9 g/dL — ABNORMAL LOW (ref 3.5–5.0)
Alkaline Phosphatase: 45 U/L (ref 38–126)
Bilirubin, Direct: 0.1 mg/dL (ref 0.0–0.2)
Indirect Bilirubin: 0.5 mg/dL (ref 0.3–0.9)
Total Bilirubin: 0.6 mg/dL (ref 0.0–1.2)
Total Protein: 6 g/dL — ABNORMAL LOW (ref 6.5–8.1)

## 2023-10-16 LAB — BRAIN NATRIURETIC PEPTIDE: B Natriuretic Peptide: 481.3 pg/mL — ABNORMAL HIGH (ref 0.0–100.0)

## 2023-10-16 LAB — URINE CULTURE

## 2023-10-16 LAB — LACTIC ACID, PLASMA
Lactic Acid, Venous: 1.4 mmol/L (ref 0.5–1.9)
Lactic Acid, Venous: 1.6 mmol/L (ref 0.5–1.9)

## 2023-10-16 LAB — RESP PANEL BY RT-PCR (RSV, FLU A&B, COVID)  RVPGX2
Influenza A by PCR: NEGATIVE
Influenza B by PCR: NEGATIVE
Resp Syncytial Virus by PCR: NEGATIVE
SARS Coronavirus 2 by RT PCR: NEGATIVE

## 2023-10-16 LAB — AMMONIA: Ammonia: 14 umol/L (ref 9–35)

## 2023-10-16 LAB — CBC
HCT: 35 % — ABNORMAL LOW (ref 36.0–46.0)
Hemoglobin: 11.3 g/dL — ABNORMAL LOW (ref 12.0–15.0)
MCH: 30.1 pg (ref 26.0–34.0)
MCHC: 32.3 g/dL (ref 30.0–36.0)
MCV: 93.1 fL (ref 80.0–100.0)
Platelets: 198 10*3/uL (ref 150–400)
RBC: 3.76 MIL/uL — ABNORMAL LOW (ref 3.87–5.11)
RDW: 12.9 % (ref 11.5–15.5)
WBC: 9.9 10*3/uL (ref 4.0–10.5)
nRBC: 0 % (ref 0.0–0.2)

## 2023-10-16 LAB — FERRITIN: Ferritin: 104 ng/mL (ref 11–307)

## 2023-10-16 LAB — TROPONIN I (HIGH SENSITIVITY)
Troponin I (High Sensitivity): 381 ng/L (ref <18)
Troponin I (High Sensitivity): 433 ng/L (ref <18)

## 2023-10-16 LAB — TSH: TSH: 3.104 u[IU]/mL (ref 0.350–4.500)

## 2023-10-16 LAB — HEMOGLOBIN A1C
Hgb A1c MFr Bld: 5.5 % (ref 4.8–5.6)
Mean Plasma Glucose: 111.15 mg/dL

## 2023-10-16 LAB — BASIC METABOLIC PANEL
Anion gap: 7 (ref 5–15)
BUN: 15 mg/dL (ref 8–23)
CO2: 26 mmol/L (ref 22–32)
Calcium: 7.7 mg/dL — ABNORMAL LOW (ref 8.9–10.3)
Chloride: 101 mmol/L (ref 98–111)
Creatinine, Ser: 0.87 mg/dL (ref 0.44–1.00)
GFR, Estimated: >60 mL/min (ref >60)
Glucose, Bld: 101 mg/dL — ABNORMAL HIGH (ref 70–99)
Potassium: 2.9 mmol/L — ABNORMAL LOW (ref 3.5–5.1)
Sodium: 134 mmol/L — ABNORMAL LOW (ref 135–145)

## 2023-10-16 LAB — PROTIME-INR
INR: 1.2 (ref 0.8–1.2)
Prothrombin Time: 15.6 s — ABNORMAL HIGH (ref 11.4–15.2)

## 2023-10-16 LAB — BLOOD GAS, VENOUS

## 2023-10-16 LAB — D-DIMER, QUANTITATIVE: D-Dimer, Quant: 2.94 ug{FEU}/mL — ABNORMAL HIGH (ref 0.00–0.50)

## 2023-10-16 LAB — MAGNESIUM: Magnesium: 1.7 mg/dL (ref 1.7–2.4)

## 2023-10-16 MED ORDER — POTASSIUM CHLORIDE 10 MEQ/100ML IV SOLN
10.0000 meq | INTRAVENOUS | Status: AC
  Administered 2023-10-16 (×2): 10 meq via INTRAVENOUS
  Filled 2023-10-16 (×2): qty 100

## 2023-10-16 MED ORDER — ASPIRIN 81 MG PO CHEW
324.0000 mg | CHEWABLE_TABLET | ORAL | Status: AC
  Administered 2023-10-16: 324 mg via ORAL
  Filled 2023-10-16: qty 4

## 2023-10-16 MED ORDER — ATORVASTATIN CALCIUM 20 MG PO TABS
40.0000 mg | ORAL_TABLET | Freq: Every day | ORAL | Status: DC
  Administered 2023-10-16 – 2023-10-19 (×4): 40 mg via ORAL
  Filled 2023-10-16 (×4): qty 2

## 2023-10-16 MED ORDER — THIAMINE HCL 100 MG/ML IJ SOLN
100.0000 mg | Freq: Every day | INTRAMUSCULAR | Status: DC
  Filled 2023-10-16: qty 2

## 2023-10-16 MED ORDER — LORAZEPAM 2 MG/ML IJ SOLN: 1.0000 mg | INTRAMUSCULAR | Status: DC | PRN

## 2023-10-16 MED ORDER — ONDANSETRON HCL 4 MG/2ML IJ SOLN
4.0000 mg | Freq: Four times a day (QID) | INTRAMUSCULAR | Status: DC | PRN
  Administered 2023-10-16 – 2023-10-18 (×3): 4 mg via INTRAVENOUS
  Filled 2023-10-16 (×3): qty 2

## 2023-10-16 MED ORDER — PANTOPRAZOLE SODIUM 40 MG PO TBEC
40.0000 mg | DELAYED_RELEASE_TABLET | Freq: Every day | ORAL | Status: DC
  Administered 2023-10-17 – 2023-10-19 (×3): 40 mg via ORAL
  Filled 2023-10-16 (×3): qty 1

## 2023-10-16 MED ORDER — POLYETHYLENE GLYCOL 3350 17 G PO PACK
17.0000 g | PACK | Freq: Every day | ORAL | Status: DC
  Filled 2023-10-16 (×3): qty 1

## 2023-10-16 MED ORDER — LORAZEPAM 2 MG PO TABS
0.0000 mg | ORAL_TABLET | Freq: Four times a day (QID) | ORAL | Status: DC
Start: 2023-10-16 — End: 2023-10-18
  Administered 2023-10-17: 2 mg via ORAL
  Administered 2023-10-18: 1 mg via ORAL
  Administered 2023-10-18: 4 mg via ORAL
  Filled 2023-10-16: qty 1
  Filled 2023-10-16: qty 2
  Filled 2023-10-16: qty 1

## 2023-10-16 MED ORDER — SENNA 8.6 MG PO TABS
1.0000 | ORAL_TABLET | Freq: Every day | ORAL | Status: DC
  Filled 2023-10-16 (×3): qty 1

## 2023-10-16 MED ORDER — HALOPERIDOL 0.5 MG PO TABS: 0.5000 mg | ORAL_TABLET | Freq: Four times a day (QID) | ORAL | Status: DC | PRN

## 2023-10-16 MED ORDER — SODIUM CHLORIDE 0.9 % IV BOLUS
500.0000 mL | Freq: Once | INTRAVENOUS | Status: AC
  Administered 2023-10-16: 500 mL via INTRAVENOUS

## 2023-10-16 MED ORDER — ASPIRIN 300 MG RE SUPP: 300.0000 mg | RECTAL | Status: AC

## 2023-10-16 MED ORDER — NITROGLYCERIN 0.4 MG SL SUBL: 0.4000 mg | SUBLINGUAL_TABLET | SUBLINGUAL | Status: DC | PRN

## 2023-10-16 MED ORDER — ALBUTEROL SULFATE (2.5 MG/3ML) 0.083% IN NEBU: 2.5000 mg | INHALATION_SOLUTION | RESPIRATORY_TRACT | Status: DC | PRN

## 2023-10-16 MED ORDER — ADULT MULTIVITAMIN W/MINERALS CH
1.0000 | ORAL_TABLET | Freq: Every day | ORAL | Status: DC
  Administered 2023-10-16 – 2023-10-19 (×4): 1 via ORAL
  Filled 2023-10-16 (×4): qty 1

## 2023-10-16 MED ORDER — THIAMINE MONONITRATE 100 MG PO TABS
100.0000 mg | ORAL_TABLET | Freq: Every day | ORAL | Status: DC
  Administered 2023-10-16 – 2023-10-19 (×4): 100 mg via ORAL
  Filled 2023-10-16 (×4): qty 1

## 2023-10-16 MED ORDER — AZITHROMYCIN 250 MG PO TABS
500.0000 mg | ORAL_TABLET | Freq: Every day | ORAL | Status: DC
Start: 2023-10-16 — End: 2023-10-21
  Administered 2023-10-16 – 2023-10-17 (×2): 500 mg via ORAL
  Filled 2023-10-16 (×2): qty 2

## 2023-10-16 MED ORDER — POTASSIUM CHLORIDE 10 MEQ/100ML IV SOLN
10.0000 meq | Freq: Once | INTRAVENOUS | Status: AC
  Administered 2023-10-16: 10 meq via INTRAVENOUS
  Filled 2023-10-16: qty 100

## 2023-10-16 MED ORDER — POLYSACCHARIDE IRON COMPLEX 150 MG PO CAPS
150.0000 mg | ORAL_CAPSULE | Freq: Every day | ORAL | Status: DC
  Administered 2023-10-17 – 2023-10-19 (×2): 150 mg via ORAL
  Filled 2023-10-16 (×5): qty 1

## 2023-10-16 MED ORDER — LACTATED RINGERS IV SOLN: INTRAVENOUS | Status: DC

## 2023-10-16 MED ORDER — ASPIRIN 81 MG PO TBEC
81.0000 mg | DELAYED_RELEASE_TABLET | Freq: Every day | ORAL | Status: DC
  Administered 2023-10-17 – 2023-10-19 (×3): 81 mg via ORAL
  Filled 2023-10-16 (×3): qty 1

## 2023-10-16 MED ORDER — FLUOXETINE HCL 20 MG PO CAPS
40.0000 mg | ORAL_CAPSULE | Freq: Two times a day (BID) | ORAL | Status: DC
  Administered 2023-10-16 – 2023-10-19 (×6): 40 mg via ORAL
  Filled 2023-10-16 (×6): qty 2

## 2023-10-16 MED ORDER — LORAZEPAM 2 MG PO TABS: 0.0000 mg | ORAL_TABLET | Freq: Two times a day (BID) | ORAL | Status: DC

## 2023-10-16 MED ORDER — FOLIC ACID 1 MG PO TABS
1.0000 mg | ORAL_TABLET | Freq: Every day | ORAL | Status: DC
  Administered 2023-10-16 – 2023-10-19 (×4): 1 mg via ORAL
  Filled 2023-10-16 (×4): qty 1

## 2023-10-16 MED ORDER — ACETAMINOPHEN 325 MG PO TABS
650.0000 mg | ORAL_TABLET | ORAL | Status: DC | PRN
  Administered 2023-10-17 – 2023-10-19 (×4): 650 mg via ORAL
  Filled 2023-10-16 (×3): qty 2

## 2023-10-16 MED ORDER — IPRATROPIUM BROMIDE 0.06 % NA SOLN: 1.0000 | Freq: Three times a day (TID) | NASAL | Status: DC | PRN

## 2023-10-16 MED ORDER — ENOXAPARIN SODIUM 60 MG/0.6ML IJ SOSY
50.0000 mg | PREFILLED_SYRINGE | Freq: Two times a day (BID) | INTRAMUSCULAR | Status: DC
  Administered 2023-10-16 – 2023-10-19 (×6): 50 mg via SUBCUTANEOUS
  Filled 2023-10-16 (×6): qty 0.6

## 2023-10-16 MED ORDER — SODIUM CHLORIDE 0.9 % IV SOLN
2.0000 g | INTRAVENOUS | Status: DC
  Administered 2023-10-17: 2 g via INTRAVENOUS
  Filled 2023-10-16 (×2): qty 20

## 2023-10-16 MED ORDER — IOHEXOL 350 MG/ML SOLN
75.0000 mL | Freq: Once | INTRAVENOUS | Status: AC | PRN
  Administered 2023-10-16: 75 mL via INTRAVENOUS

## 2023-10-16 MED ORDER — LACTATED RINGERS IV BOLUS: 1000.0000 mL | Freq: Once | INTRAVENOUS | Status: DC

## 2023-10-16 MED ORDER — MONTELUKAST SODIUM 10 MG PO TABS
10.0000 mg | ORAL_TABLET | Freq: Every day | ORAL | Status: DC
  Administered 2023-10-17 – 2023-10-19 (×3): 10 mg via ORAL
  Filled 2023-10-16 (×3): qty 1

## 2023-10-16 MED ORDER — LORAZEPAM 1 MG PO TABS: 1.0000 mg | ORAL_TABLET | ORAL | Status: DC | PRN

## 2023-10-16 NOTE — Assessment & Plan Note (Signed)
 Suspected Takotsubo cardiomyopathy Differential diagnosis would be coronary artery disease such as a non-ST elevated MI Patient has had 3 deaths in first-degree relatives in the last year culminating in her husband approximately 2 weeks ago complicated with significant depression and anxiety Twelve-lead ECG independently reviewed and interpreted the patient has sinus rhythm QTc of 510 with slight ST depression in V6, episodes of chest tightness yesterday and today Initial troponin 381 up to 433 Plan will be aspirin, enoxaparin therapeutic pending imaging results,  echocardiogram, cardiology consultation, obtaining brain atretic peptide as well as D-dimer, start high intensity statin pending lipid panel investigation results, holding off of beta-blocker given the patient's hypotension if remains stablewill add in short order

## 2023-10-16 NOTE — Assessment & Plan Note (Signed)
  Acute metabolic encephalopathy The differential diagnosis would be toxic encephalopathy secondary to benzodiazepines given her recent increasing dosages Patient has had increasing confusing and waxing waning mental status for the last 2 days Suspect this is likely secondary to her hypotension and Takotsubo cardiomyopathy as well as recent infection which has been treated CT of the head was reassuring we will obtain TSH B12 ammonia and VBG to exclude any metabolic causes

## 2023-10-16 NOTE — Assessment & Plan Note (Signed)
   Hyponatremia Likely hypovolemic patient received IV normal saline will follow

## 2023-10-16 NOTE — ED Notes (Signed)
 Called Lab to add on a troponin.

## 2023-10-16 NOTE — H&P (Addendum)
 History and Physical    Patient: Brooke [name]:[id number] DOB: 16-Aug-1945 DOA: 10/16/2023 DOS: the patient was seen and examined on 10/16/2023 PCP: Jackolyn Confer, MD  Patient coming from: Home  Chief Complaint:  Chief Complaint  Patient presents with   Weakness   HPI:   This is a 78 year old female with a past medical history of depression and anxiety, hypertension, irritable bowel syndrome and recent death of her husband approximately 2 weeks ago who presents with approximately 2 days of weakness and confusion, the patient's daughter is at the bedside during review and reports the patient's systolic blood pressures noted in the 70s at home.  Of note the patient had a UTI 5 days ago which she was treated with Macrobid.  The patient reported chest tightness yesterday evening to the daughter as well as 2 episodes today but no fever chills shortness of breath no orthopnea the patient denies any specific nausea or vomiting however does report has been having issues with "diarrhea" although at the same time has been having to manually disimpact herself.  The patient's family member is concerned about overflow diarrhea.  The patient denies any homicidal or suicidal ideation despite her recent stressors.  The case discussed with the emergency room physician of note the patient received 1 L crystalloid while in the emergency department.  Past medical records reviewed and summarized the patient's office visit 06/02/2023 which was a Medicare wellness visit that point she had irritable bowel syndrome anxiety and dizziness as well as the patient's latest visit from October 12, 2023 for her Klonopin was increased to 1 mg at nighttime.  Review of Systems:  Cannot obtain due to confusion and blunted affect Past Medical History:  Diagnosis Date   Asthma    Past Surgical History:  Procedure Laterality Date   PARTIAL HYSTERECTOMY     TEMPORAL ARTERY BIOPSY / LIGATION     Social History:  reports  that she has never smoked. She has never used smokeless tobacco. She reports that she does not drink alcohol and does not use drugs.  Allergies  Allergen Reactions   Nystatin Swelling   Sulfa Antibiotics Rash    Family History  Problem Relation Age of Onset   CAD Father    Colon cancer Father     Prior to Admission medications   Medication Sig Start Date End Date Taking? Authorizing Provider  butalbital-aspirin-caffeine [facility]) 50-325-40 MG capsule Take 1 capsule by mouth 2 (two) times daily as needed for headache or migraine. Patient taking differently: Take 1 capsule by mouth 2 (two) times daily. 09/15/23  Yes Jackolyn Confer, MD  clonazePAM (KLONOPIN) 0.5 MG tablet Take 1 tablet (0.5 mg total) by mouth 2 (two) times daily as needed for anxiety. 09/15/23 12/14/23 Yes Jackolyn Confer, MD  clonazePAM (KLONOPIN) 1 MG tablet Take 1 tablet (1 mg total) by mouth at bedtime. 10/12/23  Yes Larae Grooms, NP  FLUoxetine (PROZAC) 20 MG capsule Take 2 capsules (40 mg total) by mouth 2 (two) times daily. Patient taking differently: Take 40 mg by mouth daily. 05/12/23 10/16/23 Yes Jackolyn Confer, MD  gabapentin (NEURONTIN) 100 MG capsule Take 1 capsule (100 mg total) by mouth 2 (two) times daily. 06/02/23  Yes Jackolyn Confer, MD  ipratropium (ATROVENT) 0.06 % nasal spray Place 1 spray into both nostrils 3 (three) times daily as needed for rhinitis. 09/07/23  Yes Jackolyn Confer, MD  loperamide (IMODIUM A-D) 2 MG tablet Take 2 tablets (4 mg  total) by mouth 4 (four) times daily as needed for diarrhea or loose stools. 10/12/23  Yes Larae Grooms, NP  losartan (COZAAR) 25 MG tablet Take 25 mg by mouth in the morning and at bedtime. 03/13/16  Yes [provider]  montelukast (SINGULAIR) 10 MG tablet Take 10 mg by mouth daily.   Yes [provider]  nitrofurantoin, macrocrystal-monohydrate, (MACROBID) 100 MG capsule Take 1 capsule (100 mg total) by mouth 2 (two) times daily. 10/12/23   Yes Larae Grooms, NP  omeprazole (PRILOSEC) 20 MG capsule Take 1 capsule (20 mg total) by mouth 2 (two) times daily before a meal. 09/07/23  Yes Jackolyn Confer, MD  ondansetron (ZOFRAN) 4 MG tablet Take 1 tablet (4 mg total) by mouth every 8 (eight) hours as needed for nausea or vomiting. 10/07/23  Yes Jackolyn Confer, MD  promethazine (PHENERGAN) 12.5 MG tablet Take 1 tablet (12.5 mg total) by mouth every 6 (six) hours as needed for nausea or vomiting (If Zofran is not working). 09/09/23  Yes Arnetha Courser, MD  traZODone (DESYREL) 100 MG tablet Take 100 mg by mouth at bedtime as needed for sleep.   Yes [provider]    Physical Exam: Vitals:   10/16/23 1435 10/16/23 1735 10/16/23 1750 10/16/23 1916  BP:  121/87    Pulse:  79  86  Resp: 15 19  20   Temp:   98.5 F (36.9 C)   TempSrc:   Oral   SpO2:  95%  94%  Weight:      Height:      Patient seen room 17 of ED 6:20 PM with daughter at bedside Constitutional:  Vital Signs as per [facility] than three noted] No Acute Distress Eyes:  Pink Conjunctiva and no Ptosis Respiratory:   Respiratory Effort Normal: No Use of Respiratory Muscles,No  Intercostal Retractions             Lungs Clear to Auscultation Bilaterally Cardiovascular:   Heart Auscultated: Regular Regular with systolic murmur              Lower Extremity Edema of feet only Gastrointestinal:  Abdomen soft mild tenderness epigastrium and central region without palpable masses, guarding or rebound  No Palpable Splenomegaly or Hepatomegaly Neurologic:  Moves all 4 limbs to gravity Psychiatric:  Patient Orientated to  Place and Person, not time Patient with blunted mood and affect Waxing and Waning Mental Status, able to get one/two word answers  Data Reviewed: Labs, Radiology, ECG as detailed in HPI and A/P   Assessment and Plan: * 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 Differential diagnosis would be other causes of troponin elevation  including: coronary artery disease such as a non-ST elevated MI, non-traumatic non-ischemic myocardial injury or Type II supply/demand mismatch MI although history suggestive of primary differential Patient has had 3 deaths in first-degree relatives in the last year culminating in her husband approximately 2 weeks ago complicated with significant depression and anxiety Twelve-lead ECG independently reviewed and interpreted the patient has sinus rhythm QTc of 510 with slight ST depression in V6, episodes of chest tightness yesterday and today Initial troponin 381 up to 433 Plan will be aspirin, enoxaparin therapeutic pending imaging results,  echocardiogram, cardiology consultation, obtaining brain atretic peptide as well as D-dimer, start high intensity statin pending lipid panel investigation results, holding off of beta-blocker given the patient's hypotension if remains stablewill add in short order  Cystitis    Cystitis, Resolved Patient has WBC of 9.9 normal lactic  acid do not believe this is contributing to her current presentation Of note the patient's urinalysis today is reassuring previous urinalysis was concerning for UTI E. coli pansensitive from culture The patient has completed 5 days of Macrobid, we will hold off further treatment given the reassuring urinalysis and 5 days completed Of note chest x-ray is clear do not think any other infective sources are contributing to today's presentation   Acute metabolic encephalopathy  Acute metabolic encephalopathy The differential diagnosis would be toxic encephalopathy secondary to benzodiazepines given her recent increasing dosages Patient has had increasing confusing and waxing waning mental status for the last 2 days Suspect this is likely secondary to her hypotension and Takotsubo cardiomyopathy as well as recent infection which has been treated CT of the head was reassuring we will obtain TSH B12 ammonia and VBG to exclude any  metabolic causes  Depression  Depression and anxiety Denies any homicidal or suicidal ideation Given the concern for possible toxic encephalopathy we will hold her gabapentin as well as scheduled benzodiazepines and put her on the WAS protocol for this indication rather than alcohol, add haloperidol as needed for any episodes of agitation, continue fluoxetine 40 mg twice daily   Iron deficiency  patient denies any hematochezia or melena we will start p.o. supplementation needs outpatient follow-up investigation repeat CBC in the a.m.  Hypocalcemia Calcium 7.7, have ordered ionized calcium and LFTs for albumin suspect is a pseudo hypocalcemia  Hypokalemia   Hypokalemia Check magnesium levels replacing with 3 rounds of IV KCl, potassium is noted 2.9 currently  Rhinitis Rhinitis Continue the patient's intranasal Atrovent as needed  Essential hypertension Hypertension Hold the patient's Cozaar 25 mg  Hiatal hernia Continue patient's home PPI  Hyponatremia   Hyponatremia Likely hypovolemic patient received IV normal saline will follow  Constipation  Suspected constipation History provided by family member and there is suspicion for overflow diarrhea seems very possible will obtain abdominal x-ray given the vague abdominal pain on palpation to confirm this and then followed with laxative, given reassuring abdominal exam normal lactic acid do not believe an intra-abdominal process is driving her symptoms, obtaining liver function tests  Hypotension Hypotension, resolved Patient reportedly had systolic blood pressure 70s at home now 121 systolic following 1 L of fluids, lactic acid is within normal limits We will follow transthoracic echocardiogram results, likely was hypovolemic in nature  COPD mixed type (HCC)   Chronic obstructive pulmonary disorder By history patient does not have any wheeze today Will order albuterol as needed and order VBG to exclude any CO2  retention given her confusion   Iron deficiency anemia-resolved as of 10/16/2023  patient denies any hematochezia or melena we will start p.o. supplementation needs outpatient follow-up investigation repeat CBC in the a.m.      Advance Care Planning:   Code Status: Full Code Both the patient as well as the patient's daughter at the bedside report the patient is full code  Consults: Cardiology (CV [facility] Staff Message sent as well as secure chat to Dr. Duke Salvia) , EMR Order Placed  Family Communication: Daughter at the bedside   Severity of Illness: The appropriate patient status for this patient is INPATIENT. Inpatient status is judged to be reasonable and necessary in order to provide the required intensity of service to ensure the patient's safety. The patient's presenting symptoms, physical exam findings, and initial radiographic and laboratory data in the context of their chronic comorbidities is felt to place them at high risk for further  clinical deterioration. Furthermore, it is not anticipated that the patient will be medically stable for discharge from the hospital within 2 midnights of admission.   * I certify that at the point of admission it is my clinical judgment that the patient will require inpatient hospital care spanning beyond 2 midnights from the point of admission due to high intensity of service, high risk for further deterioration and high frequency of surveillance required.*  8:23 PM AXR Results: IMPRESSION: 1. Moderate fecal retention consistent with constipation. 2. No bowel obstruction or ileus. This supports the clinical suspicion of constipation with overflow diarrhea will continue to also hold the patient's outpatient loperamide start senna and MiraLAX  8:28 PM Patient's D-dimer is 2.94 brain atretic peptide 481.3, echo already ordered and the patient is on therapeutic enoxaparin we will order CTA and duplex to rule out biomarker positive PE as the cause of  this patient's elevated troponin and chest pressure Given the patient is "high risk" (hypotension, symptomatic) do not feel clinically appropriate to clear with age adjustment.  11:11 PM No PE but infiltrates not seen on CXR [[place]] or auscultated noted on CT, given constitutional symptoms and metabolic encephalopathy will treat as not definitively primary CHF (Echo pending) Ceftriaxone + Azithro (Influenza A/B/COVID/RSV -) CT: Bilateral lower lobe and patchy right upper lobe airspace opacities concerning for pneumonia.  11:55 PM Notified by nurse "Her BP is soft, 89/59, 81/63, pt is asymptomatic no denies, chest pain or light headedness. " ->Lactic Acid Ordered  and Bolus ordered , placed MAP goal order for 65 or higher S/T patient via telephone, mentation much improved and able to make informed decision, wants to be DNR (we had discussed earlier in initial code conversation for full and she wanted to think about, spoke in full sentences and demonstrated understanding of situation and decision) EMR updated, if LA WNL do not need to aggressively follow given lack of symptoms and low suspicion of septic or cardiogenic shock  1:51 AM 10/17/2023 K 3.3 post IV replacement PO x2 doses ordered  LA noted WNL   Author: Princess Bruins, MD 10/16/2023 7:40 PM  For on call review [email or web address].

## 2023-10-16 NOTE — Assessment & Plan Note (Signed)
 Continue patient's home PPI

## 2023-10-16 NOTE — Assessment & Plan Note (Signed)
 Calcium 7.7, have ordered ionized calcium and LFTs for albumin suspect is a pseudo hypocalcemia

## 2023-10-16 NOTE — Assessment & Plan Note (Signed)
    Cystitis, Resolved Patient has WBC of 9.9 normal lactic acid do not believe this is contributing to her current presentation Of note the patient's urinalysis today is reassuring previous urinalysis was concerning for UTI E. coli pansensitive from culture The patient has completed 5 days of Macrobid, we will hold off further treatment given the reassuring urinalysis and 5 days completed Of note chest x-ray is clear do not think any other infective sources are contributing to today's presentation

## 2023-10-16 NOTE — Assessment & Plan Note (Signed)
   Hypokalemia Check magnesium levels replacing with 3 rounds of IV KCl, potassium is noted 2.9 currently

## 2023-10-16 NOTE — Assessment & Plan Note (Signed)
 Hypotension, resolved Patient reportedly had systolic blood pressure 70s at home now 121 systolic following 1 L of fluids, lactic acid is within normal limits We will follow transthoracic echocardiogram results, likely was hypovolemic in nature

## 2023-10-16 NOTE — Assessment & Plan Note (Signed)
 patient denies any hematochezia or melena we will start p.o. supplementation needs outpatient follow-up investigation repeat CBC in the a.m.

## 2023-10-16 NOTE — Assessment & Plan Note (Signed)
   Chronic obstructive pulmonary disorder By history patient does not have any wheeze today Will order albuterol as needed and order VBG to exclude any CO2 retention given her confusion

## 2023-10-16 NOTE — ED Notes (Signed)
 Called [facility].

## 2023-10-16 NOTE — ED Provider Notes (Addendum)
 [facility] Provider Note    Event Date/Time   First MD Initiated Contact with Patient 10/16/23 1329     (approximate)   History   Weakness   HPI  Brooke [name] is a 78 y.o. female with history of COPD, hypertension, depression, and anxiety who presents with generalized weakness, confusion, and low blood pressure over the last 1 to 2 days.  The patient was diagnosed with a UTI 5 days ago and started on Macrobid.  She has almost completed the full course but over the last day she has become weaker, had a low blood pressure at home as low as the 70s systolic, and has been somewhat confused per the daughter.  However, the confusion has been somewhat more gradual over the last several weeks to months.  The patient reports feeling nauseous.  She denies any acute pain.  She is still having dysuria.  She denies any vomiting or diarrhea.  She has no cough, shortness of breath, or chest pain.  Reviewed the past medical records.  The patient was admitted to the hospitalist service in early February with weakness, lethargy, cough, and low-grade fever.  She was positive for COVID and was hyponatremic.  Urine culture from 3/12 shows E. coli which is pansensitive.   Physical Exam   Triage Vital Signs: ED Triage Vitals  Encounter Vitals Group     BP 10/16/23 1328 96/75     Systolic BP Percentile --      Diastolic BP Percentile --      Pulse Rate 10/16/23 1328 83     Resp 10/16/23 1328 18     Temp 10/16/23 1328 97.9 F (36.6 C)     Temp Source 10/16/23 1328 Oral     SpO2 10/16/23 1328 96 %     Weight 10/16/23 1336 107 lb (48.5 kg)     Height 10/16/23 1336 5\' 2"  (1.575 m)     Head Circumference --      Peak Flow --      Pain Score 10/16/23 1332 0     Pain Loc --      Pain Education --      Exclude from Growth Chart --     Most recent vital signs: Vitals:   10/16/23 1430 10/16/23 1435  BP: 102/83   Pulse: 76   Resp: (!) 21 15  Temp:    SpO2: 99%       General: Alert, no distress.  CV:  Good peripheral perfusion.  Lungs CTAB. Resp:  Normal effort.  Abd:  Soft and nontender.  No distention.  Other:  EOMI.  PERRLA.  Motor intact all extremities.  Slightly dry mucous membranes.   ED Results / Procedures / Treatments   Labs (all labs ordered are listed, but only abnormal results are displayed) Labs Reviewed  BASIC METABOLIC PANEL - Abnormal; Notable for the following components:      Result Value   Sodium 134 (*)    Potassium 2.9 (*)    Glucose, Bld 101 (*)    Calcium 7.7 (*)    All other components within normal limits  CBC - Abnormal; Notable for the following components:   RBC 3.76 (*)    Hemoglobin 11.3 (*)    HCT 35.0 (*)    All other components within normal limits  IRON AND TIBC - Abnormal; Notable for the following components:   Iron 23 (*)    TIBC 190 (*)    All other  components within normal limits  TROPONIN I (HIGH SENSITIVITY) - Abnormal; Notable for the following components:   Troponin I (High Sensitivity) 381 (*)    All other components within normal limits  RESP PANEL BY RT-PCR (RSV, FLU A&B, COVID)  [id number]  FERRITIN  LACTIC ACID, PLASMA  URINALYSIS, ROUTINE W REFLEX MICROSCOPIC  LACTIC ACID, PLASMA     EKG  ED ECG REPORT I, Dionne Bucy, the attending physician, personally viewed and interpreted this ECG.  Date: 10/16/2023 EKG Time: 1337 Rate: 78 Rhythm: normal sinus rhythm QRS Axis: normal Intervals: LAFB ST/T Wave abnormalities: None specific ST abnormalities Narrative Interpretation: no evidence of acute ischemia    RADIOLOGY  Chest x-ray: I independently viewed and interpreted the images; there is no focal consolidation or edema  CT head:  IMPRESSION:  1. No acute findings.  2. Previous right suboccipital craniectomy.    PROCEDURES:  Critical Care performed: Yes, see critical care procedure note(s)  .Critical Care  Performed by: Dionne Bucy, MD Authorized  by: Dionne Bucy, MD   Critical care provider statement:    Critical care time (minutes):  30   Critical care time was exclusive of:  Separately billable procedures and treating other patients   Critical care was necessary to treat or prevent imminent or life-threatening deterioration of the following conditions:  Cardiac failure and sepsis   Critical care was time spent personally by me on the following activities:  Development of treatment plan with patient or surrogate, discussions with consultants, evaluation of patient's response to treatment, examination of patient, ordering and review of laboratory studies, ordering and review of radiographic studies, ordering and performing treatments and interventions, pulse oximetry, re-evaluation of patient's condition, review of old charts and obtaining history from patient or surrogate    MEDICATIONS ORDERED IN ED: Medications  sodium chloride 0.9 % bolus 500 mL (0 mLs Intravenous Stopped 10/16/23 1530)     IMPRESSION / MDM / ASSESSMENT AND PLAN / ED COURSE  I reviewed the triage vital signs and the nursing notes.  78 year old female with PMH as noted above, who has been treated for UTI for the last 5 days, presents with generalized weakness, some confusion, and persistent dysuria.  She was hypotensive at home and with EMS.  Initial blood pressure here was low as well.  Other vital signs are normal.  Physical exam is otherwise unremarkable for acute findings.  Differential diagnosis includes, but is not limited to, persistent or worsening UTI, other acute infection/sepsis, influenza or other viral syndrome, dehydration, electrolyte abnormality, AKI, other metabolic disturbance, less likely cardiac or CNS cause.  Will obtain CT head, chest x-ray, lab workup, give fluids, and reassess.  Patient's presentation is most consistent with acute presentation with potential threat to life or bodily function.  The patient is on the cardiac monitor to  evaluate for evidence of arrhythmia and/or significant heart rate changes.  ----------------------------------------- 3:33 PM on 10/16/2023 -----------------------------------------  Blood pressure has remained stable.  CBC shows no leukocytosis.  BMP is unremarkable.  Initial lactate is not significantly elevated.  However, the initial troponin is significant elevated at 381.  The patient has no chest pain.  EKG does not demonstrate any ischemic changes.  Urinalysis is still pending.  I consulted Dr. Duke Salvia from cardiology and discussed the case with her.  She does not recommend anticoagulation at this time given the lack of chest pain or EKG changes.  Overall the elevated troponin is more consistent with demand ischemia.  We will trend the  troponin and reassess if there is a significant change.  I have handed off the patient to the oncoming ED physician Dr. Scotty Court.   FINAL CLINICAL IMPRESSION(S) / ED DIAGNOSES   Final diagnoses:  Generalized weakness  Elevated troponin     Rx / DC Orders   ED Discharge Orders     None        Note:  This document was prepared using Dragon voice recognition software and may include unintentional dictation errors.    Dionne Bucy, MD 10/16/23 1535    Dionne Bucy, MD 10/16/23 1535

## 2023-10-16 NOTE — Assessment & Plan Note (Signed)
  Depression and anxiety Denies any homicidal or suicidal ideation Given the concern for possible toxic encephalopathy we will hold her gabapentin as well as scheduled benzodiazepines and put her on the WAS protocol for this indication rather than alcohol, add haloperidol as needed for any episodes of agitation, continue fluoxetine 40 mg twice daily

## 2023-10-16 NOTE — Assessment & Plan Note (Signed)
  Suspected constipation History provided by family member and there is suspicion for overflow diarrhea seems very possible will obtain abdominal x-ray given the vague abdominal pain on palpation to confirm this and then followed with laxative, given reassuring abdominal exam normal lactic acid do not believe an intra-abdominal process is driving her symptoms, obtaining liver function tests

## 2023-10-16 NOTE — Assessment & Plan Note (Signed)
 Hypertension Hold the patient's Cozaar 25 mg

## 2023-10-16 NOTE — ED Triage Notes (Signed)
 From home. Complaining of weakness since yesterday and diagnosed with UTI on Monday. Given antibiotic macrobid. BP: 87/55 at EMS time of arrival. Received of NS. AOX4. Daughter states that patient had fever last night. Patient oxygen was in 80s and EMS put oxygen on. Patient also has swelling in her feet that is not normal for patient. Room air stat at ED:100%

## 2023-10-16 NOTE — Assessment & Plan Note (Signed)
 Rhinitis Continue the patient's intranasal Atrovent as needed

## 2023-10-16 NOTE — Progress Notes (Signed)
 PHARMACY - ANTICOAGULATION CONSULT NOTE  Pharmacy Consult for Enoxaparin  Indication: chest pain/ACS  Allergies  Allergen Reactions   Nystatin Swelling   Sulfa Antibiotics Rash    Patient Measurements: Height: 5\' 2"  (157.5 cm) Weight: 48.5 kg (107 lb) IBW/kg (Calculated) : 50.1 Heparin Dosing Weight: 48.5 kg   Vital Signs: Temp: 98.5 F (36.9 C) (03/15 1750) Temp Source: Oral (03/15 1750) BP: 121/87 (03/15 1735) Pulse Rate: 79 (03/15 1735)  Labs: Recent Labs    10/16/23 1332 10/16/23 1613  HGB 11.3*  --   HCT 35.0*  --   PLT 198  --   CREATININE 0.87  --   TROPONINIHS 381* 433*    Estimated Creatinine Clearance: 40.8 mL/min (by C-G formula based on SCr of 0.87 mg/dL).   Medical History: Past Medical History:  Diagnosis Date   Asthma     Medications:  Not on anticoagulation at home  Assessment: Patient is a 78 year old female with a past medical history of COPD, hypertension, depression, and anxiety. She presented to ED with generalized weakness, confusion, and low blood pressure x 1-2 days. EKG showed normal sinus rhythm with non-specific ST abnormalities. Troponin elevated to 433. Pharmacy was consulted to initiate patient on enoxaparin for ACS/NSTEMI/CP.  Hgb 11.3. PLT 198. Baseline INR ordered.  Goal of Therapy:  Anti-Xa level 0.6-1 units/ml 4hrs after LMWH dose given- not routinely monitored Monitor platelets by anticoagulation protocol: Yes   Plan:  Start enoxaparin 50 mg SQ twice daily Monitor CBC at least every 72 hours while on enoxaparin  Merryl Hacker, PharmD Clinical Pharmacist  10/16/2023,6:58 PM

## 2023-10-17 ENCOUNTER — Inpatient Hospital Stay
Admit: 2023-10-17 | Discharge: 2023-10-17 | Disposition: A | Discharge status: Home / Self Care | Attending: Physician Assistant | Admitting: Physician Assistant

## 2023-10-17 DIAGNOSIS — L8990 Pressure ulcer of unspecified site, unspecified stage: Secondary | ICD-10-CM | POA: Insufficient documentation

## 2023-10-17 DIAGNOSIS — I5181 Takotsubo syndrome: Secondary | ICD-10-CM | POA: Diagnosis not present

## 2023-10-17 DIAGNOSIS — I214 Non-ST elevation (NSTEMI) myocardial infarction: Secondary | ICD-10-CM

## 2023-10-17 LAB — COMPREHENSIVE METABOLIC PANEL
ALT: 12 U/L (ref 0–44)
AST: 18 U/L (ref 15–41)
Albumin: 2.3 g/dL — ABNORMAL LOW (ref 3.5–5.0)
Alkaline Phosphatase: 40 U/L (ref 38–126)
Anion gap: 7 (ref 5–15)
BUN: 12 mg/dL (ref 8–23)
CO2: 25 mmol/L (ref 22–32)
Calcium: 7.9 mg/dL — ABNORMAL LOW (ref 8.9–10.3)
Chloride: 101 mmol/L (ref 98–111)
Creatinine, Ser: 0.62 mg/dL (ref 0.44–1.00)
GFR, Estimated: >60 mL/min (ref >60)
Glucose, Bld: 114 mg/dL — ABNORMAL HIGH (ref 70–99)
Potassium: 3.4 mmol/L — ABNORMAL LOW (ref 3.5–5.1)
Sodium: 133 mmol/L — ABNORMAL LOW (ref 135–145)
Total Bilirubin: 0.5 mg/dL (ref 0.0–1.2)
Total Protein: 5.3 g/dL — ABNORMAL LOW (ref 6.5–8.1)

## 2023-10-17 LAB — CBC
HCT: 31.3 % — ABNORMAL LOW (ref 36.0–46.0)
Hemoglobin: 10.2 g/dL — ABNORMAL LOW (ref 12.0–15.0)
MCH: 29.6 pg (ref 26.0–34.0)
MCHC: 32.6 g/dL (ref 30.0–36.0)
MCV: 90.7 fL (ref 80.0–100.0)
Platelets: 198 10*3/uL (ref 150–400)
RBC: 3.45 MIL/uL — ABNORMAL LOW (ref 3.87–5.11)
RDW: 13 % (ref 11.5–15.5)
WBC: 7.1 10*3/uL (ref 4.0–10.5)
nRBC: 0 % (ref 0.0–0.2)

## 2023-10-17 LAB — POTASSIUM: Potassium: 3.3 mmol/L — ABNORMAL LOW (ref 3.5–5.1)

## 2023-10-17 LAB — ECHOCARDIOGRAM COMPLETE
AR max vel: 1.14 cm2
AV Area VTI: 1.23 cm2
AV Area mean vel: 1.03 cm2
AV Mean grad: 15.5 mmHg
AV Peak grad: 30.9 mmHg
Ao pk vel: 2.78 m/s
Area-P 1/2: 5.58 cm2
Height: 60 in
MV VTI: 2.42 cm2
S' Lateral: 2.6 cm
Weight: 1777.79 [oz_av]

## 2023-10-17 LAB — LIPID PANEL
Cholesterol: 151 mg/dL (ref 0–200)
HDL: 34 mg/dL — ABNORMAL LOW (ref >40)
LDL Cholesterol: 103 mg/dL — ABNORMAL HIGH (ref 0–99)
Total CHOL/HDL Ratio: 4.4 ratio
Triglycerides: 71 mg/dL (ref <150)
VLDL: 14 mg/dL (ref 0–40)

## 2023-10-17 LAB — TROPONIN I (HIGH SENSITIVITY)
Troponin I (High Sensitivity): 199 ng/L (ref <18)
Troponin I (High Sensitivity): 197 ng/L (ref <18)
Troponin I (High Sensitivity): 188 ng/L (ref <18)

## 2023-10-17 LAB — VITAMIN B12: Vitamin B-12: 376 pg/mL (ref 180–914)

## 2023-10-17 LAB — LACTIC ACID, PLASMA: Lactic Acid, Venous: 1.2 mmol/L (ref 0.5–1.9)

## 2023-10-17 MED ORDER — POTASSIUM CHLORIDE 20 MEQ PO PACK
40.0000 meq | PACK | Freq: Two times a day (BID) | ORAL | Status: AC
  Administered 2023-10-17 (×2): 40 meq via ORAL
  Filled 2023-10-17 (×2): qty 2

## 2023-10-17 MED ORDER — LACTATED RINGERS IV BOLUS
500.0000 mL | Freq: Once | INTRAVENOUS | Status: AC
Start: 2023-10-17 — End: 2023-10-17
  Administered 2023-10-17: 500 mL via INTRAVENOUS

## 2023-10-17 NOTE — Progress Notes (Signed)
 Progress Note   Patient: ANNALYCIA DONE VHQ:[id number] DOB: 03/18/1946 DOA: 10/16/2023     1 DOS: the patient was seen and examined on 10/17/2023   Brief hospital course:  78 year old female with a past medical history of depression and anxiety, hypertension, irritable bowel syndrome and recent death of her husband approximately 2 weeks ago who presents with approximately 2 days of weakness and confusion, the patient's daughter is at the bedside during review and reports the patient's systolic blood pressures noted in the 70s at home.  Of note the patient had a UTI 5 days ago which she was treated with Macrobid.  The patient reported chest tightness yesterday evening to the daughter as well as 2 episodes today but no fever chills shortness of breath no orthopnea the patient denies any specific nausea or vomiting however does report has been having issues with "diarrhea" although at the same time has been having to manually disimpact herself.  The patient's family member is concerned about overflow diarrhea.  The patient denies any homicidal or suicidal ideation despite her recent stressors.   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  Waiting for the echo.  Cardiac cath was deferred as patient was confused.  Cardiology following.   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 Differential diagnosis would be coronary artery disease such as a non-ST elevated MI Patient has had 3 deaths in first-degree relatives in the last year culminating in her husband approximately 2 weeks ago complicated with significant depression and anxiety Twelve-lead ECG independently reviewed and interpreted the patient has sinus rhythm QTc of 510 with slight ST depression in V6, episodes of chest tightness yesterday and today Initial troponin 381 up to 433  Plan will be aspirin, follow echocardiogram, cardiology following  recommendations appreciated.,  Continue with high intensity statin pending lipid panel investigation results, holding off of beta-blocker given the patient's hypotension if remains stablewill add in short order  Cystitis -completed the course prior to arrival.  Antibiotics discontinued.   Acute metabolic encephalopathy -improving  Acute metabolic encephalopathy The differential diagnosis would be toxic encephalopathy secondary to benzodiazepines given her recent increasing dosages Patient has had increasing confusing and waxing waning mental status for the last 2 days Suspect this is likely secondary to her hypotension and Takotsubo cardiomyopathy as well as recent infection which has been treated CT of the head was reassuring we will obtain TSH B12 ammonia and VBG to exclude any metabolic causes  Depression  Depression and anxiety Denies any homicidal or suicidal ideation Given the concern for possible toxic encephalopathy we will hold her gabapentin as well as scheduled benzodiazepines and put her on the WAS protocol for this indication rather than alcohol, add haloperidol as needed for any episodes of agitation, continue fluoxetine 40 mg twice daily Iron deficiency  patient denies any hematochezia or melena we will start p.o. supplementation needs outpatient follow-up investigation repeat CBC in the a.m.  Hypocalcemia Calcium 7.7, have ordered ionized calcium and LFTs for albumin suspect is a pseudo hypocalcemia  Hypokalemia -improving  Hypokalemia Check magnesium levels replacing with 3 rounds of IV KCl, potassium is noted 2.9 currently  Rhinitis Rhinitis Continue the patient's intranasal Atrovent as needed  Essential hypertension Hypertension Hold the patient's Cozaar 25 mg  Hiatal hernia Continue patient's home PPI  Hyponatremia -stable with no worsening   Hyponatremia Likely hypovolemic patient received IV normal saline will follow  Constipation  Suspected  constipation History provided  by family member and there is suspicion for overflow diarrhea seems very possible will obtain abdominal x-ray given the vague abdominal pain on palpation to confirm this and then followed with laxative, given reassuring abdominal exam normal lactic acid do not believe an intra-abdominal process is driving her symptoms, obtaining liver function tests  Hypotension Hypotension, resolved Patient reportedly had systolic blood pressure 70s at home now 121 systolic following 1 L of fluids, lactic acid is within normal limits We will follow transthoracic echocardiogram results, likely was hypovolemic in nature  COPD mixed type  Chronic obstructive pulmonary disorder By history patient does not have any wheeze today Will order albuterol as needed and order VBG to exclude any CO2 retention given her confusion  Iron deficiency anemia-resolved as of 10/16/2023  patient denies any hematochezia or melena we will start p.o. supplementation needs outpatient follow-up investigation repeat CBC in the a.m.     Subjective: Patient seen and examined the morning rounds no overnight events.    Patient asymptomatic with no complaint of chest pain.  Vital labs and imaging reviewed. Family at bedside updated about the status.  Echo pending at the time of evaluation.  Physical Exam: Vitals:   10/17/23 0410 10/17/23 0804 10/17/23 1120 10/17/23 1605  BP: 112/81 (!) 125/90 129/84 (!) 139/91  Pulse: 71 80 80 80  Resp:  16 20 20   Temp: 98.2 F (36.8 C) 98.3 F (36.8 C) 98.1 F (36.7 C) 98.3 F (36.8 C)  TempSrc: Oral  Oral Oral  SpO2: 90% 93% 99% 97%  Weight:      Height:       Physical Exam HENT:     Head: Normocephalic.  Eyes:     Extraocular Movements: Extraocular movements intact.     Pupils: Pupils are equal, round, and reactive to light.  Cardiovascular:     Rate and Rhythm: Normal rate and regular rhythm.  Pulmonary:     Effort: Pulmonary effort is normal.   Abdominal:     Palpations: Abdomen is soft.  Musculoskeletal:        General: Normal range of motion.  Skin:    General: Skin is warm.  Neurological:     General: No focal deficit present.     Mental Status: She is alert. Mental status is at baseline.  Psychiatric:        Mood and Affect: Mood normal.     Data Reviewed:  Echo pending  Family Communication: Daughter and son r-in-law updated about the status  Disposition: Status is: Inpatient Remains inpatient appropriate because: Chest pain  Planned Discharge Destination: Home    Time spent: 37 minutes  Author: Kirstie Peri, MD 10/17/2023 7:54 PM  For on call review [email or web address].

## 2023-10-17 NOTE — Consult Note (Addendum)
 Cardiology Consultation   Patient ID: Brooke [name] MRN: [id number]; DOB: 12/05/1945  Admit date: 10/16/2023 Date of Consult: 10/17/2023  PCP:  Jackolyn Confer, MD   [facility] Providers Cardiologist:  None        Patient Profile:   Brooke [name] is a 78 y.o. female with a hx of hypertension, depression, anxiety, and irritable bowel syndrome who is being seen 10/17/2023 for the evaluation of elevated troponin t the request of Dr. Dorna Mai.  History of Present Illness:   Ms. Busser presented to the hospital with 2 days of weakness and confusion.  Blood pressures were reportedly as low as the 700s.  She was treated for UTI 5 days prior to admission with nitrofurantoin.  On the night prior to admission she reported chest tightness.  She denies shortness of breath, lower extremity edema, orthopnea, or PND.  She denied nausea or vomiting but did reportedly have diarrhea at home.  In the ED hs-troponin was elevated to 381--> 433.  EKG was without ischemic changes.  Allergies consulted for further evaluation.  Echocardiogram is pending but not yet completed.  Of note, her husband passed away 2 weeks ago.  She does recall having chest pain prior to coming to the hospital.  She does not remember what she was doing at the time.  She does not get much exercise at baseline.  She denies having issues with chest pain chronically.  She has not had any lower extremity edema, orthopnea, or PND.  She currently denies chest pain or pressure.  Her only complaint is pain in her right shoulder which she reports is chronic.   Past Medical History:  Diagnosis Date   Asthma     Past Surgical History:  Procedure Laterality Date   PARTIAL HYSTERECTOMY     TEMPORAL ARTERY BIOPSY / LIGATION       Home Medications:  Prior to Admission medications   Medication Sig Start Date End Date Taking? Authorizing Provider  butalbital-aspirin-caffeine [facility]) 50-325-40 MG capsule Take 1 capsule by mouth 2  (two) times daily as needed for headache or migraine. Patient taking differently: Take 1 capsule by mouth 2 (two) times daily. 09/15/23  Yes Jackolyn Confer, MD  clonazePAM (KLONOPIN) 0.5 MG tablet Take 1 tablet (0.5 mg total) by mouth 2 (two) times daily as needed for anxiety. 09/15/23 12/14/23 Yes Jackolyn Confer, MD  clonazePAM (KLONOPIN) 1 MG tablet Take 1 tablet (1 mg total) by mouth at bedtime. 10/12/23  Yes Larae Grooms, NP  FLUoxetine (PROZAC) 20 MG capsule Take 2 capsules (40 mg total) by mouth 2 (two) times daily. Patient taking differently: Take 40 mg by mouth daily. 05/12/23 10/16/23 Yes Jackolyn Confer, MD  gabapentin (NEURONTIN) 100 MG capsule Take 1 capsule (100 mg total) by mouth 2 (two) times daily. 06/02/23  Yes Jackolyn Confer, MD  ipratropium (ATROVENT) 0.06 % nasal spray Place 1 spray into both nostrils 3 (three) times daily as needed for rhinitis. 09/07/23  Yes Jackolyn Confer, MD  loperamide (IMODIUM A-D) 2 MG tablet Take 2 tablets (4 mg total) by mouth 4 (four) times daily as needed for diarrhea or loose stools. 10/12/23  Yes Larae Grooms, NP  losartan (COZAAR) 25 MG tablet Take 25 mg by mouth in the morning and at bedtime. 03/13/16  Yes [provider]  montelukast (SINGULAIR) 10 MG tablet Take 10 mg by mouth daily.   Yes [provider]  nitrofurantoin, macrocrystal-monohydrate, (MACROBID) 100 MG capsule  Take 1 capsule (100 mg total) by mouth 2 (two) times daily. 10/12/23  Yes Larae Grooms, NP  omeprazole (PRILOSEC) 20 MG capsule Take 1 capsule (20 mg total) by mouth 2 (two) times daily before a meal. 09/07/23  Yes Jackolyn Confer, MD  ondansetron (ZOFRAN) 4 MG tablet Take 1 tablet (4 mg total) by mouth every 8 (eight) hours as needed for nausea or vomiting. 10/07/23  Yes Jackolyn Confer, MD  promethazine (PHENERGAN) 12.5 MG tablet Take 1 tablet (12.5 mg total) by mouth every 6 (six) hours as needed for nausea or vomiting (If Zofran is not working). 09/09/23   Yes Arnetha Courser, MD  traZODone (DESYREL) 100 MG tablet Take 100 mg by mouth at bedtime as needed for sleep.   Yes [provider]    Inpatient Medications: Scheduled Meds:  aspirin EC  81 mg Oral Daily   atorvastatin  40 mg Oral Daily   azithromycin  500 mg Oral Daily   enoxaparin (LOVENOX) injection  50 mg Subcutaneous Q12H   FLUoxetine  40 mg Oral BID   folic acid  1 mg Oral Daily   iron polysaccharides  150 mg Oral Daily   LORazepam  0-4 mg Oral Q6H   Followed by   Melene Muller ON 10/18/2023] LORazepam  0-4 mg Oral Q12H   montelukast  10 mg Oral Daily   multivitamin with minerals  1 tablet Oral Daily   pantoprazole  40 mg Oral Daily   polyethylene glycol  17 g Oral Daily   potassium chloride  40 mEq Oral BID   senna  1 tablet Oral QHS   thiamine  100 mg Oral Daily   Or   thiamine  100 mg Intravenous Daily   Continuous Infusions:  cefTRIAXone (ROCEPHIN)  IV 2 g (10/17/23 0049)   PRN Meds: acetaminophen, albuterol, haloperidol, ipratropium, LORazepam **OR** LORazepam, ondansetron (ZOFRAN) IV  Allergies:    Allergies  Allergen Reactions   Nystatin Swelling   Sulfa Antibiotics Rash    Social History:   Social History   Socioeconomic History   Marital status: Married    Spouse name: Not on file   Number of children: Not on file   Years of education: Not on file   Highest education level: Not on file  Occupational History   Not on file  Tobacco Use   Smoking status: Never   Smokeless tobacco: Never  Substance and Sexual Activity   Alcohol use: Never   Drug use: Never   Sexual activity: Not Currently  Other Topics Concern   Not on file  Social History Narrative   Not on file   Social Drivers of Health   Financial Resource Strain: Not on file  Food Insecurity: No Food Insecurity (10/16/2023)   Hunger Vital Sign    Worried About Running Out of Food in the Last Year: Never true    Ran Out of Food in the Last Year: Never true  Transportation Needs: No  Transportation Needs (10/16/2023)   PRAPARE - Administrator, Civil Service (Medical): No    Lack of Transportation (Non-Medical): No  Physical Activity: Not on file  Stress: Not on file  Social Connections: Moderately Isolated (10/16/2023)   Social Connection and Isolation Panel [NHANES]    Frequency of Communication with Friends and Family: More than three times a week    Frequency of Social Gatherings with Friends and Family: Three times a week    Attends Religious Services: 1 to 4  times per year    Active Member of Clubs or Organizations: No    Attends Banker Meetings: Never    Marital Status: Widowed  Intimate Partner Violence: Not At Risk (10/16/2023)   Humiliation, Afraid, Rape, and Kick questionnaire    Fear of Current or Ex-Partner: No    Emotionally Abused: No    Physically Abused: No    Sexually Abused: No    Family History:    Family History  Problem Relation Age of Onset   CAD Father    Colon cancer Father      ROS:  Please see the history of present illness.   All other ROS reviewed and negative.     Physical Exam/Data:   Vitals:   10/16/23 2346 10/16/23 2348 10/17/23 0128 10/17/23 0410  BP: (!) 89/59 (!) 81/63 95/74 112/81  Pulse: 63 72 79 71  Resp: 20     Temp: 98.4 F (36.9 C)   98.2 F (36.8 C)  TempSrc: Oral   Oral  SpO2: 95%   90%  Weight:      Height:        Intake/Output Summary (Last 24 hours) at 10/17/2023 0730 Last data filed at 10/17/2023 0300 Gross per 24 hour  Intake 1540.95 ml  Output --  Net 1540.95 ml      10/16/2023    7:55 PM 10/16/2023    1:36 PM 10/12/2023    2:43 PM  Last 3 Weights  Weight (lbs) 111 lb 1.8 oz 107 lb 109 lb 6.4 oz  Weight (kg) 50.4 kg 48.535 kg 49.624 kg     Body mass index is 21.7 kg/m.  General:  Well nourished, well developed, in no acute distress HEENT: normal Neck: no JVD Vascular: No carotid bruits; Distal pulses 2+ bilaterally Cardiac:  normal S1, S2; RRR; 2/6  mid-peaking systolic murmur at the left upper sternal border Lungs:  clear to auscultation bilaterally, no wheezing, rhonchi or rales  Abd: soft, nontender, no hepatomegaly  Ext: no edema Musculoskeletal:  No deformities, BUE and BLE strength normal and equal Skin: warm and dry  Neuro:  CNs 2-12 intact, no focal abnormalities noted.  Slightly confused and wanting her daughter to come to the hospital. Psych:  Normal affect   EKG:  The EKG was personally reviewed and demonstrates:  Sinus rhythm.  Rate 84 bpm.  PACs.  LAFB.  Early R wave transition. Telemetry:  Telemetry was personally reviewed and demonstrates: Sinus arrhythmia.  Frequent PACs.  Relevant CV Studies: Echo pending  Laboratory Data:  High Sensitivity Troponin:   Recent Labs  Lab 10/16/23 1332 10/16/23 1613  TROPONINIHS 381* 433*     Chemistry Recent Labs  Lab 10/16/23 1332 10/16/23 1852 10/17/23 0025 10/17/23 0330  NA 134*  --   --  133*  K 2.9*  --  3.3* 3.4*  CL 101  --   --  101  CO2 26  --   --  25  GLUCOSE 101*  --   --  114*  BUN 15  --   --  12  CREATININE 0.87  --   --  0.62  CALCIUM 7.7*  --   --  7.9*  MG  --  1.7  --   --   GFRNONAA >60  --   --  >60  ANIONGAP 7  --   --  7    Recent Labs  Lab 10/16/23 1957 10/17/23 0330  PROT 6.0* 5.3*  ALBUMIN 2.9*  2.3*  AST 20 18  ALT 13 12  ALKPHOS 45 40  BILITOT 0.6 0.5   Lipids  Recent Labs  Lab 10/17/23 0330  CHOL 151  TRIG 71  HDL 34*  LDLCALC 103*  CHOLHDL 4.4    Hematology Recent Labs  Lab 10/16/23 1332 10/17/23 0330  WBC 9.9 7.1  RBC 3.76* 3.45*  HGB 11.3* 10.2*  HCT 35.0* 31.3*  MCV 93.1 90.7  MCH 30.1 29.6  MCHC 32.3 32.6  RDW 12.9 13.0  PLT 198 198   Thyroid  Recent Labs  Lab 10/16/23 1852  TSH 3.104    BNP Recent Labs  Lab 10/16/23 1852  BNP 481.3*    DDimer  Recent Labs  Lab 10/16/23 1852  DDIMER 2.94*     Radiology/Studies:  CT Angio Chest Pulmonary Embolism (PE) W or WO Contrast Result Date:  10/16/2023 CLINICAL DATA:  Pulmonary embolism (PE) suspected, high prob. Weakness. Positive D-dimer. EXAM: CT ANGIOGRAPHY CHEST WITH CONTRAST TECHNIQUE: Multidetector CT imaging of the chest was performed using the standard protocol during bolus administration of intravenous contrast. Multiplanar CT image reconstructions and MIPs were obtained to evaluate the vascular anatomy. RADIATION DOSE REDUCTION: This exam was performed according to the departmental dose-optimization program which includes automated exposure control, adjustment of the mA and/or kV according to patient size and/or use of iterative reconstruction technique. CONTRAST:  75mL OMNIPAQUE IOHEXOL 350 MG/ML SOLN COMPARISON:  07/02/2009 FINDINGS: Cardiovascular: Heart is normal size. Slight aneurysmal dilatation of the ascending thoracic aorta, 4 cm. No filling defects in the pulmonary arteries to suggest pulmonary emboli. Coronary artery and aortic atherosclerosis. Mediastinum/Nodes: No mediastinal, hilar, or axillary adenopathy. Trachea and esophagus are unremarkable. Thyroid unremarkable. Lungs/Pleura: Small bilateral pleural effusions. Bilateral lower lobe airspace opacities. Patchy right upper lobe airspace opacity. Findings could reflect pneumonia. Upper Abdomen: Elevation of the left hemidiaphragm. No acute findings Musculoskeletal: Chest wall soft tissues are unremarkable. No acute bony abnormality. Review of the MIP images confirms the above findings. IMPRESSION: No evidence of pulmonary embolus. Small bilateral pleural effusions. Bilateral lower lobe and patchy right upper lobe airspace opacities concerning for pneumonia. Coronary artery disease. 4 cm ascending thoracic aortic aneurysm. Recommend annual imaging followup by CTA or MRA. This recommendation follows 2010 ACCF/AHA/AATS/ACR/ASA/SCA/SCAI/SIR/STS/SVM Guidelines for the Diagnosis and Management of Patients with Thoracic Aortic Disease. Circulation. 2010; 121: Z610-R604. Aortic aneurysm  NOS (ICD10-I71.9) Aortic Atherosclerosis (ICD10-I70.0). Electronically Signed   By: Charlett Nose M.D.   On: 10/16/2023 22:20   US Venous Img Lower Bilateral (DVT) Result Date: 10/16/2023 CLINICAL DATA:  78 year old female with history of positive D-dimer. EXAM: BILATERAL LOWER EXTREMITY VENOUS DOPPLER ULTRASOUND TECHNIQUE: Gray-scale sonography with graded compression, as well as color Doppler and duplex ultrasound were performed to evaluate the lower extremity deep venous systems from the level of the common femoral vein and including the common femoral, femoral, profunda femoral, popliteal and calf veins including the posterior tibial, peroneal and gastrocnemius veins when visible. The superficial great saphenous vein was also interrogated. Spectral Doppler was utilized to evaluate flow at rest and with distal augmentation maneuvers in the common femoral, femoral and popliteal veins. COMPARISON:  None Available. FINDINGS: RIGHT LOWER EXTREMITY Common Femoral Vein: No evidence of thrombus. Normal compressibility, respiratory phasicity and response to augmentation. Saphenofemoral Junction: No evidence of thrombus. Normal compressibility and flow on color Doppler imaging. Profunda Femoral Vein: No evidence of thrombus. Normal compressibility and flow on color Doppler imaging. Femoral Vein: No evidence of thrombus. Normal compressibility, respiratory phasicity and response  to augmentation. Popliteal Vein: No evidence of thrombus. Normal compressibility, respiratory phasicity and response to augmentation. Calf Veins: No evidence of thrombus. Normal compressibility and flow on color Doppler imaging. Other Findings:  None. LEFT LOWER EXTREMITY Common Femoral Vein: No evidence of thrombus. Normal compressibility, respiratory phasicity and response to augmentation. Saphenofemoral Junction: No evidence of thrombus. Normal compressibility and flow on color Doppler imaging. Profunda Femoral Vein: No evidence of thrombus.  Normal compressibility and flow on color Doppler imaging. Femoral Vein: No evidence of thrombus. Normal compressibility, respiratory phasicity and response to augmentation. Popliteal Vein: No evidence of thrombus. Normal compressibility, respiratory phasicity and response to augmentation. Calf Veins: No evidence of thrombus. Normal compressibility and flow on color Doppler imaging. Other Findings:  None. IMPRESSION: No evidence of bilateral lower extremity deep venous thrombosis. Marliss Coots, MD [facility] Electronically Signed   By: Marliss Coots M.D.   On: 10/16/2023 21:29   DG Abd 2 Views Result Date: 10/16/2023 CLINICAL DATA:  Constipation EXAM: ABDOMEN - 2 VIEW COMPARISON:  None Available. FINDINGS: Supine and upright frontal views of the abdomen and pelvis are obtained. Bowel gas pattern is unremarkable without obstruction or ileus. Moderate fecal retention, most pronounced within the ascending and transverse colon. No masses or abnormal calcifications. No free gas in the greater peritoneal sac. Chronic elevation of the left hemidiaphragm. IMPRESSION: 1. Moderate fecal retention consistent with constipation. 2. No bowel obstruction or ileus. Electronically Signed   By: Sharlet Salina M.D.   On: 10/16/2023 19:37   DG Chest Port 1 View Result Date: 10/16/2023 CLINICAL DATA:  Weakness since yesterday. EXAM: PORTABLE CHEST 1 VIEW COMPARISON:  09/07/2023 FINDINGS: Chronic elevation of the left hemidiaphragm. Chronic cardiomegaly and aortic atherosclerosis. Chronic low volumes at the lung bases, similar to the prior examination. Upper lungs remain clear. No visible effusion. IMPRESSION: No active disease. Chronic elevation of the left hemidiaphragm. Chronic low volumes at the lung bases. Electronically Signed   By: Paulina Fusi M.D.   On: 10/16/2023 15:31   CT Head Wo Contrast Result Date: 10/16/2023 CLINICAL DATA:  Mental status change of unknown  cause. EXAM: CT HEAD WITHOUT CONTRAST TECHNIQUE: Contiguous axial images were obtained from the base of the skull through the vertex without intravenous contrast. RADIATION DOSE REDUCTION: This exam was performed according to the departmental dose-optimization program which includes automated exposure control, adjustment of the mA and/or kV according to patient size and/or use of iterative reconstruction technique. COMPARISON:  03/31/2016. FINDINGS: Brain: No evidence of acute infarction, hemorrhage, hydrocephalus, extra-axial collection or mass lesion/mass effect. Stable changes from a previous right suboccipital craniectomy. Vascular: Tortuous basilar artery. Dense atherosclerotic calcification along the basilar artery, stable. Skull: No fracture or bone lesion. Sinuses/Orbits: Visualized globes orbits are unremarkable. Previous sinus surgery. Other: None. IMPRESSION: 1. No acute findings. 2. Previous right suboccipital craniectomy. Electronically Signed   By: Amie Portland M.D.   On: 10/16/2023 15:10     Assessment and Plan:   # NSTEMI:  # Chest pain: # Hyperlipidemia: Her chest discomfort is somewhat difficult to characterize.  It was fleeting.  EKG has no ischemic changes.  High-sensitivity troponin is elevated and rising.  This could be consistent with NSTEMI.  Chest CT revealed coronary and aortic atherosclerosis.  However it is also occurring in the setting of sepsis from a urinary source.  She also had a recent traumatic death in the family 2 weeks ago.  Could be consistent with Takotsubo cardiomyopathy.  I  am more suspicious for one of these etiologies then obstructive coronary disease.  Echocardiogram is pending.  Will hold off on heparin for now unless she has recurrent chest pain.  Given that she did have chest pain it is worth doing an ischemic evaluation.  She is still slightly confused right now so would not recommend cardiac catheterization at this time.  If this clears prior to discharge  it can be done this admission.  Repeat hs-troponin as it has not peaked. Continue aspirin and statin.  LDL was 103.  She was started on atorvastatin this admission.  Repeat lipids and a CMP in 2 to 3 months.  # Hypertension: Home losartan is on hold in the setting of hypotension and sepsis.  # Ascending aortic aneurysm: Chest CT revealed a 4 cm ascending aortic aneurysm.  Repeat imaging in 1 year.  # Murmur:  Murmur consistent with aortic stenosis.  Echocardiogram pending as above.  Risk Assessment/Risk Scores:     TIMI Risk Score for Unstable Angina or Non-ST Elevation MI:   The patient's TIMI risk score is  , which indicates a  % risk of all cause mortality, new or recurrent myocardial infarction or need for urgent revascularization in the next 14 days.          For questions or updates, please contact [facility] Please consult [email or web address] for contact info under    Signed, Chilton Si, MD  10/17/2023 7:30 AM

## 2023-10-17 NOTE — Progress Notes (Incomplete)
 Patient has soft BP 89/59 (69), 81/63(71), other VSS. Pt is asymptomatic. Denies dizziness or lightheadedness. Notified Dr. Dorna Mai, order to give bolus. And Map goal is

## 2023-10-17 NOTE — Progress Notes (Signed)
*  PRELIMINARY RESULTS* Echocardiogram 2D Echocardiogram has been performed.  Brooke [name] Mahabir C Robson Trickey 10/17/2023, 1:15 PM

## 2023-10-17 NOTE — Progress Notes (Addendum)
 Pt is unable to urinate, does not feel the urge to pee. Patient came in for bil LE weakness. Bladder scan pt and she have 310 ml, per pt she does feel like her bladder is full but unable to pee it out. Pt was in and out cath in the ED yesterday. Ask provider if pt can be in and out cath. Per provider not enough volume, will recheck again at 0600. Plan of care continued.

## 2023-10-17 NOTE — Progress Notes (Signed)
 PT Cancellation Note  Patient Details Name: Brooke [name] MRN: [id number] DOB: 26-Jun-1946   Cancelled Treatment:    Reason Eval/Treat Not Completed: Patient declined, no reason specified. Pt declining PT evaluation x2; the first attempt she had just finished her OT evaluation and the second attempt pt reported having been up again to go to the bathroom and requesting to hold PT to tomorrow. PT will continue to follow-up with pt acutely as available and appropriate.   Alessandra Bevels Emanual Lamountain 10/17/2023, 11:08 AM

## 2023-10-17 NOTE — Plan of Care (Signed)
  Problem: Cardiac: Goal: Ability to achieve and maintain adequate cardiovascular perfusion will improve Outcome: Progressing   Problem: Education: Goal: Knowledge of General Education information will improve Description: Including pain rating scale, medication(s)/side effects and non-pharmacologic comfort measures Outcome: Progressing   Problem: Clinical Measurements: Goal: Ability to maintain clinical measurements within normal limits will improve Outcome: Progressing Goal: Will remain free from infection Outcome: Progressing Goal: Respiratory complications will improve Outcome: Progressing Goal: Cardiovascular complication will be avoided Outcome: Progressing

## 2023-10-17 NOTE — Progress Notes (Signed)
 Patient has soft BP 89/59 (69), 81/63(71), other VSS. Pt is asymptomatic. Denies dizziness or lightheadedness. Notified Dr. Dorna Mai, order to give bolus and Map goal is >65. Also order to check lactic.   Patient also express to be DNR/DNI, MD confirm conversation with the patient. No other concern at the moment. Plan of care continued.

## 2023-10-17 NOTE — H&P (Incomplete)
 History and Physical    Patient: Brooke [name] [id number] DOB: 1946-07-17 DOA: 10/16/2023 DOS: the patient was seen and examined on 10/16/2023 PCP: Jackolyn Confer, MD  Patient coming from: Home  Chief Complaint:  Chief Complaint  Patient presents with  . Weakness   HPI:   This is a 78 year old female with a past medical history of depression and anxiety, hypertension, irritable bowel syndrome and recent death of her husband approximately 2 weeks ago who presents with approximately 2 days of weakness and confusion, the patient's daughter is at the bedside during review and reports the patient's systolic blood pressures noted in the 70s at home.  Of note the patient had a UTI 5 days ago which she was treated with Macrobid.  The patient reported chest tightness yesterday evening to the daughter as well as 2 episodes today but no fever chills shortness of breath no orthopnea the patient denies any specific nausea or vomiting however does report has been having issues with "diarrhea" although at the same time has been having to manually disimpact herself.  The patient's family member is concerned about overflow diarrhea.  The patient denies any homicidal or suicidal ideation despite her recent stressors.  The case discussed with the emergency room physician of note the patient received 1 L crystalloid while in the emergency department.  Past medical records reviewed and summarized the patient's office visit 06/02/2023 which was a Medicare wellness visit that point she had irritable bowel syndrome anxiety and dizziness as well as the patient's latest visit from October 12, 2023 for her Klonopin was increased to 1 mg at nighttime.  Review of Systems:  Cannot obtain due to confusion and blunted affect Past Medical History:  Diagnosis Date  . Asthma    Past Surgical History:  Procedure Laterality Date  . PARTIAL HYSTERECTOMY    . TEMPORAL ARTERY BIOPSY / LIGATION     Social History:   reports that she has never smoked. She has never used smokeless tobacco. She reports that she does not drink alcohol and does not use drugs.  Allergies  Allergen Reactions  . Nystatin Swelling  . Sulfa Antibiotics Rash    Family History  Problem Relation Age of Onset  . CAD Father   . Colon cancer Father     Prior to Admission medications   Medication Sig Start Date End Date Taking? Authorizing Provider  butalbital-aspirin-caffeine [facility]) 50-325-40 MG capsule Take 1 capsule by mouth 2 (two) times daily as needed for headache or migraine. Patient taking differently: Take 1 capsule by mouth 2 (two) times daily. 09/15/23  Yes Jackolyn Confer, MD  clonazePAM (KLONOPIN) 0.5 MG tablet Take 1 tablet (0.5 mg total) by mouth 2 (two) times daily as needed for anxiety. 09/15/23 12/14/23 Yes Jackolyn Confer, MD  clonazePAM (KLONOPIN) 1 MG tablet Take 1 tablet (1 mg total) by mouth at bedtime. 10/12/23  Yes Larae Grooms, NP  FLUoxetine (PROZAC) 20 MG capsule Take 2 capsules (40 mg total) by mouth 2 (two) times daily. Patient taking differently: Take 40 mg by mouth daily. 05/12/23 10/16/23 Yes Jackolyn Confer, MD  gabapentin (NEURONTIN) 100 MG capsule Take 1 capsule (100 mg total) by mouth 2 (two) times daily. 06/02/23  Yes Jackolyn Confer, MD  ipratropium (ATROVENT) 0.06 % nasal spray Place 1 spray into both nostrils 3 (three) times daily as needed for rhinitis. 09/07/23  Yes Jackolyn Confer, MD  loperamide (IMODIUM A-D) 2 MG tablet Take 2 tablets (4 mg  total) by mouth 4 (four) times daily as needed for diarrhea or loose stools. 10/12/23  Yes Larae Grooms, NP  losartan (COZAAR) 25 MG tablet Take 25 mg by mouth in the morning and at bedtime. 03/13/16  Yes [provider]  montelukast (SINGULAIR) 10 MG tablet Take 10 mg by mouth daily.   Yes [provider]  nitrofurantoin, macrocrystal-monohydrate, (MACROBID) 100 MG capsule Take 1 capsule (100 mg total) by mouth 2 (two) times  daily. 10/12/23  Yes Larae Grooms, NP  omeprazole (PRILOSEC) 20 MG capsule Take 1 capsule (20 mg total) by mouth 2 (two) times daily before a meal. 09/07/23  Yes Jackolyn Confer, MD  ondansetron (ZOFRAN) 4 MG tablet Take 1 tablet (4 mg total) by mouth every 8 (eight) hours as needed for nausea or vomiting. 10/07/23  Yes Jackolyn Confer, MD  promethazine (PHENERGAN) 12.5 MG tablet Take 1 tablet (12.5 mg total) by mouth every 6 (six) hours as needed for nausea or vomiting (If Zofran is not working). 09/09/23  Yes Arnetha Courser, MD  traZODone (DESYREL) 100 MG tablet Take 100 mg by mouth at bedtime as needed for sleep.   Yes [provider]    Physical Exam: Vitals:   10/16/23 1435 10/16/23 1735 10/16/23 1750 10/16/23 1916  BP:  121/87    Pulse:  79  86  Resp: 15 19  20   Temp:   98.5 F (36.9 C)   TempSrc:   Oral   SpO2:  95%  94%  Weight:      Height:      Patient seen room 17 of ED 6:20 PM with daughter at bedside Constitutional:  Vital Signs as per [facility] three noted] No Acute Distress Eyes:  Pink Conjunctiva and no Ptosis Respiratory:   Respiratory Effort Normal: No Use of Respiratory Muscles,No  Intercostal Retractions             Lungs Clear to Auscultation Bilaterally Cardiovascular:   Heart Auscultated: Regular Regular with systolic murmur              Lower Extremity Edema of feet only Gastrointestinal:  Abdomen soft mild tenderness epigastrium and central region without palpable masses, guarding or rebound  No Palpable Splenomegaly or Hepatomegaly Neurologic:  Moves all 4 limbs to gravity Psychiatric:  Patient Orientated to  Place and Person, not time Patient with blunted mood and affect Waxing and Waning Mental Status, able to get one/two word answers  Data Reviewed: Labs, Radiology, ECG as detailed in HPI and A/P   Assessment and Plan: * 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 Differential diagnosis would be coronary artery  disease such as a non-ST elevated MI Patient has had 3 deaths in first-degree relatives in the last year culminating in her husband approximately 2 weeks ago complicated with significant depression and anxiety Twelve-lead ECG independently reviewed and interpreted the patient has sinus rhythm QTc of 510 with slight ST depression in V6, episodes of chest tightness yesterday and today Initial troponin 381 up to 433 Plan will be aspirin, enoxaparin therapeutic pending imaging results,  echocardiogram, cardiology consultation, obtaining brain atretic peptide as well as D-dimer, start high intensity statin pending lipid panel investigation results, holding off of beta-blocker given the patient's hypotension if remains stablewill add in short order  Cystitis    Cystitis, Resolved Patient has WBC of 9.9 normal lactic acid do not believe this is contributing to her current presentation Of note the patient's urinalysis today is reassuring previous urinalysis was  concerning for UTI E. coli pansensitive from culture The patient has completed 5 days of Macrobid, we will hold off further treatment given the reassuring urinalysis and 5 days completed Of note chest x-ray is clear do not think any other infective sources are contributing to today's presentation   Acute metabolic encephalopathy  Acute metabolic encephalopathy The differential diagnosis would be toxic encephalopathy secondary to benzodiazepines given her recent increasing dosages Patient has had increasing confusing and waxing waning mental status for the last 2 days Suspect this is likely secondary to her hypotension and Takotsubo cardiomyopathy as well as recent infection which has been treated CT of the head was reassuring we will obtain TSH B12 ammonia and VBG to exclude any metabolic causes  Depression  Depression and anxiety Denies any homicidal or suicidal ideation Given the concern for possible toxic encephalopathy we will hold  her gabapentin as well as scheduled benzodiazepines and put her on the WAS protocol for this indication rather than alcohol, add haloperidol as needed for any episodes of agitation, continue fluoxetine 40 mg twice daily   Iron deficiency  patient denies any hematochezia or melena we will start p.o. supplementation needs outpatient follow-up investigation repeat CBC in the a.m.  Hypocalcemia Calcium 7.7, have ordered ionized calcium and LFTs for albumin suspect is a pseudo hypocalcemia  Hypokalemia   Hypokalemia Check magnesium levels replacing with 3 rounds of IV KCl, potassium is noted 2.9 currently  Rhinitis Rhinitis Continue the patient's intranasal Atrovent as needed  Essential hypertension Hypertension Hold the patient's Cozaar 25 mg  Hiatal hernia Continue patient's home PPI  Hyponatremia   Hyponatremia Likely hypovolemic patient received IV normal saline will follow  Constipation  Suspected constipation History provided by family member and there is suspicion for overflow diarrhea seems very possible will obtain abdominal x-ray given the vague abdominal pain on palpation to confirm this and then followed with laxative, given reassuring abdominal exam normal lactic acid do not believe an intra-abdominal process is driving her symptoms, obtaining liver function tests  Hypotension Hypotension, resolved Patient reportedly had systolic blood pressure 70s at home now 121 systolic following 1 L of fluids, lactic acid is within normal limits We will follow transthoracic echocardiogram results, likely was hypovolemic in nature  COPD mixed type (HCC)   Chronic obstructive pulmonary disorder By history patient does not have any wheeze today Will order albuterol as needed and order VBG to exclude any CO2 retention given her confusion   Iron deficiency anemia-resolved as of 10/16/2023  patient denies any hematochezia or melena we will start p.o. supplementation needs  outpatient follow-up investigation repeat CBC in the a.m.      Advance Care Planning:   Code Status: Full Code Both the patient as well as the patient's daughter at the bedside report the patient is full code  Consults: Cardiology (CV DIV [facility] Staff Message sent as well as secure chat to Dr. Duke Salvia) , EMR Order Placed  Family Communication: Daughter at the bedside   Severity of Illness: The appropriate patient status for this patient is INPATIENT. Inpatient status is judged to be reasonable and necessary in order to provide the required intensity of service to ensure the patient's safety. The patient's presenting symptoms, physical exam findings, and initial radiographic and laboratory data in the context of their chronic comorbidities is felt to place them at high risk for further clinical deterioration. Furthermore, it is not anticipated that the patient will be medically stable for discharge from the hospital within 2 midnights  of admission.   * I certify that at the point of admission it is my clinical judgment that the patient will require inpatient hospital care spanning beyond 2 midnights from the point of admission due to high intensity of service, high risk for further deterioration and high frequency of surveillance required.*  8:23 PM AXR Results: IMPRESSION: 1. Moderate fecal retention consistent with constipation. 2. No bowel obstruction or ileus. This supports the clinical suspicion of constipation with overflow diarrhea will continue to also hold the patient's outpatient loperamide start senna and MiraLAX  8:28 PM Patient's D-dimer is 2.94 brain atretic peptide 481.3, echo already ordered and the patient is on therapeutic enoxaparin we will order CTA and duplex to rule out biomarker positive PE as the cause of this patient's elevated troponin and chest pressure Given the patient is "high risk" (hypotension, symptomatic) do not feel clinically appropriate to clear with age  adjustment.  11:11 PM No PE but infiltrates not seen on CXR [[place]] or auscultated noted on CT, given constitutional symptoms and metabolic encephalopathy will treat as not definitively primary CHF Ceftriaxone + Azithro (Influenza A/B/COVID/RSV -) CT: Bilateral lower lobe and patchy right upper lobe airspace opacities concerning for pneumonia.  11:55 PM Notified by nurse "Her BP is soft, 89/59, 81/63, pt is asymptomatic no denies, chest pain or light headedness. " ->Lactic Acid Ordered  and 500 Bolus ordered , placed MAP goal order for 65 or higher S/T patient via telephone, mentation good and understands, wants to be DNR (we had discussed earlier) EMR updated, if LA WNL suspect     Author: Princess Bruins, MD 10/16/2023 7:40 PM  For on call review [email or web address].

## 2023-10-17 NOTE — Evaluation (Signed)
 Occupational Therapy Evaluation Patient Details Name: Brooke [name] MRN: [id number] DOB: 1946/01/12 Today's Date: 10/17/2023   History of Present Illness   78 year old female with a past medical history of depression and anxiety, hypertension, irritable bowel syndrome and recent death of her husband approximately 2 weeks ago who presents with approximately 2 days of weakness and confusion with recent UTI. 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    Clinical Impressions [name] was seen for OT evaluation this date. Prior to hospital admission, pt was MOD I using SPC household distances. Pt reports recently moved in with her daughter. Pt currently requires no assist for bed mobility. Pt initially states " I can't stand" however does not give effort on first attempt - with encouragement stands with no assist. SBA + RW for toilet t/f ~10 ft. MOD I don B socks and underwear. Pt would benefit from skilled OT to address noted impairments and functional limitations (see below for any additional details). Upon hospital discharge, recommend OT follow up.    If plan is discharge home, recommend the following:   A little help with bathing/dressing/bathroom;Help with stairs or ramp for entrance     Functional Status Assessment   Patient has had a recent decline in their functional status and demonstrates the ability to make significant improvements in function in a reasonable and predictable amount of time.     Equipment Recommendations   Other (comment) (RW)     Recommendations for Other Services         Precautions/Restrictions   Precautions Precautions: Fall Recall of Precautions/Restrictions: Intact Restrictions Weight Bearing Restrictions Per Provider Order: No     Mobility Bed Mobility Overal bed mobility: Independent                  Transfers Overall transfer level: Needs assistance Equipment used: Rolling walker (2  wheels) Transfers: Sit to/from Stand Sit to Stand: Supervision           General transfer comment: initially states " I can't stand" however does not give effort; with encouragement stands no assist.        Balance Overall balance assessment: Needs assistance Sitting-balance support: Feet supported, No upper extremity supported Sitting balance-Leahy Scale: Good     Standing balance support: No upper extremity supported, During functional activity Standing balance-Leahy Scale: Good                             ADL either performed or assessed with clinical judgement   ADL Overall ADL's : Needs assistance/impaired                                       General ADL Comments: SBA + RW for toilet t/f ~10 ft. MOD I don B socks and underwear.      Pertinent Vitals/Pain Pain Assessment Pain Assessment: Faces Faces Pain Scale: No hurt     Extremity/Trunk Assessment Upper Extremity Assessment Upper Extremity Assessment: Overall WFL for tasks assessed   Lower Extremity Assessment Lower Extremity Assessment: Overall WFL for tasks assessed       Communication Communication Communication: Impaired Factors Affecting Communication: Hearing impaired   Cognition Arousal: Alert Behavior During Therapy: Lability Cognition: No family/caregiver present to determine baseline             OT - Cognition Comments:  self-limiting however answers all questions appropriately                 Following commands: Intact                  Home Living Family/patient expects to be discharged to:: Private residence Living Arrangements: Children (daughter) Available Help at Discharge: Family Type of Home: House Home Access: Stairs to enter Secretary/administrator of Steps: "a couple"                   Home Equipment: Gilmer Mor - single point   Additional Comments: pt reports recently moved in with her daughter, does not answer all home setup  questions      Prior Functioning/Environment Prior Level of Function : Independent/Modified Independent             Mobility Comments: cane all the time for limited household mobility      OT Problem List: Decreased activity tolerance;Decreased safety awareness   OT Treatment/Interventions: Self-care/ADL training;Therapeutic exercise;DME and/or AE instruction;Energy conservation;Therapeutic activities      OT Goals(Current goals can be found in the care plan section)   Acute Rehab OT Goals Patient Stated Goal: to go home OT Goal Formulation: With patient Time For Goal Achievement: 10/31/23 Potential to Achieve Goals: Good ADL Goals Pt Will Perform Grooming: with modified independence;standing Pt Will Perform Lower Body Dressing: with modified independence;sit to/from stand Pt Will Transfer to Toilet: with modified independence;ambulating;regular height toilet   OT Frequency:  Min 2X/week    Co-evaluation              AM-PAC OT "6 Clicks" Daily Activity     Outcome Measure Help from another person eating meals?: None Help from another person taking care of personal grooming?: None Help from another person toileting, which includes using toliet, bedpan, or urinal?: A Little Help from another person bathing (including washing, rinsing, drying)?: A Little Help from another person to put on and taking off regular upper body clothing?: None Help from another person to put on and taking off regular lower body clothing?: None 6 Click Score: 22   End of Session Equipment Utilized During Treatment: Rolling walker (2 wheels) Nurse Communication: Mobility status  Activity Tolerance: Patient tolerated treatment well Patient left: in chair;with call bell/phone within reach;with chair alarm set  OT Visit Diagnosis: Other abnormalities of gait and mobility (R26.89);Muscle weakness (generalized) (M62.81)                Time: 4259-5638 OT Time Calculation (min): 24  min Charges:  OT General Charges $OT Visit: 1 Visit OT Evaluation $OT Eval Low Complexity: 1 Low OT Treatments $Self Care/Home Management : 8-22 mins  Kathie Dike, M.S. OTR/L  10/17/23, 8:55 AM  ascom [phone]

## 2023-10-18 ENCOUNTER — Encounter: Payer: Self-pay | Admitting: Nurse Practitioner

## 2023-10-18 ENCOUNTER — Inpatient Hospital Stay

## 2023-10-18 DIAGNOSIS — I2510 Atherosclerotic heart disease of native coronary artery without angina pectoris: Secondary | ICD-10-CM | POA: Diagnosis not present

## 2023-10-18 DIAGNOSIS — R7989 Other specified abnormal findings of blood chemistry: Secondary | ICD-10-CM

## 2023-10-18 DIAGNOSIS — I350 Nonrheumatic aortic (valve) stenosis: Secondary | ICD-10-CM | POA: Diagnosis not present

## 2023-10-18 DIAGNOSIS — I214 Non-ST elevation (NSTEMI) myocardial infarction: Secondary | ICD-10-CM | POA: Diagnosis not present

## 2023-10-18 DIAGNOSIS — I340 Nonrheumatic mitral (valve) insufficiency: Secondary | ICD-10-CM | POA: Diagnosis not present

## 2023-10-18 LAB — BLOOD GAS, VENOUS
Bicarbonate: 33.7 mmol/L — ABNORMAL HIGH (ref 20.0–28.0)
O2 Saturation: 22.8 mmol/L — ABNORMAL HIGH (ref 0.0–2.0)
Patient temperature: 37
Patient temperature: 37 %
pCO2, Ven: 57 mmHg (ref 44–60)
pH, Ven: 7.38 (ref 7.25–7.43)
pO2, Ven: 33.7 mmol/L — ABNORMAL HIGH (ref 32–45)

## 2023-10-18 LAB — CBC
HCT: 31.1 % — ABNORMAL LOW (ref 36.0–46.0)
Hemoglobin: 10.2 g/dL — ABNORMAL LOW (ref 12.0–15.0)
MCH: 29.7 pg (ref 26.0–34.0)
MCHC: 32.8 g/dL (ref 30.0–36.0)
MCV: 90.7 fL (ref 80.0–100.0)
Platelets: 225 10*3/uL (ref 150–400)
RBC: 3.43 MIL/uL — ABNORMAL LOW (ref 3.87–5.11)
RDW: 12.9 % (ref 11.5–15.5)
WBC: 5.6 10*3/uL (ref 4.0–10.5)
nRBC: 0 % (ref 0.0–0.2)

## 2023-10-18 LAB — COMPREHENSIVE METABOLIC PANEL
ALT: 10 U/L (ref 0–44)
AST: 16 U/L (ref 15–41)
Albumin: 2.4 g/dL — ABNORMAL LOW (ref 3.5–5.0)
Alkaline Phosphatase: 37 U/L — ABNORMAL LOW (ref 38–126)
Anion gap: 5 (ref 5–15)
BUN: 8 mg/dL (ref 8–23)
CO2: 28 mmol/L (ref 22–32)
Calcium: 8.4 mg/dL — ABNORMAL LOW (ref 8.9–10.3)
Chloride: 102 mmol/L (ref 98–111)
Creatinine, Ser: 0.63 mg/dL (ref 0.44–1.00)
GFR, Estimated: >60 mL/min (ref >60)
Glucose, Bld: 93 mg/dL (ref 70–99)
Potassium: 4.2 mmol/L (ref 3.5–5.1)
Sodium: 135 mmol/L (ref 135–145)
Total Bilirubin: 0.4 mg/dL (ref 0.0–1.2)
Total Protein: 5.1 g/dL — ABNORMAL LOW (ref 6.5–8.1)

## 2023-10-18 MED ORDER — CLONAZEPAM 1 MG PO TABS
1.0000 mg | ORAL_TABLET | Freq: Every evening | ORAL | Status: DC | PRN
  Administered 2023-10-18: 1 mg via ORAL
  Filled 2023-10-18: qty 1

## 2023-10-18 MED ORDER — METOPROLOL TARTRATE 50 MG PO TABS: 100.0000 mg | ORAL_TABLET | Freq: Two times a day (BID) | ORAL | Status: DC

## 2023-10-18 MED ORDER — METOPROLOL TARTRATE 50 MG PO TABS
100.0000 mg | ORAL_TABLET | Freq: Once | ORAL | Status: AC
  Administered 2023-10-18: 100 mg via ORAL
  Filled 2023-10-18: qty 2

## 2023-10-18 MED ORDER — GABAPENTIN 100 MG PO CAPS
100.0000 mg | ORAL_CAPSULE | Freq: Two times a day (BID) | ORAL | Status: DC
  Administered 2023-10-18 – 2023-10-19 (×3): 100 mg via ORAL
  Filled 2023-10-18 (×3): qty 1

## 2023-10-18 MED ORDER — IOHEXOL 350 MG/ML SOLN
80.0000 mL | Freq: Once | INTRAVENOUS | Status: AC | PRN
  Administered 2023-10-18: 80 mL via INTRAVENOUS

## 2023-10-18 MED ORDER — LOSARTAN POTASSIUM 25 MG PO TABS
25.0000 mg | ORAL_TABLET | Freq: Every day | ORAL | Status: DC
Start: 2023-10-18 — End: 2023-10-19
  Administered 2023-10-18 – 2023-10-19 (×2): 25 mg via ORAL
  Filled 2023-10-18 (×2): qty 1

## 2023-10-18 MED ORDER — CLONAZEPAM 0.5 MG PO TABS
0.5000 mg | ORAL_TABLET | Freq: Two times a day (BID) | ORAL | Status: DC | PRN
  Administered 2023-10-18: 0.5 mg via ORAL
  Filled 2023-10-18: qty 1

## 2023-10-18 MED ORDER — NITROGLYCERIN 0.4 MG SL SUBL
0.8000 mg | SUBLINGUAL_TABLET | Freq: Once | SUBLINGUAL | Status: AC
  Administered 2023-10-18: 0.8 mg via SUBLINGUAL

## 2023-10-18 NOTE — Plan of Care (Signed)
   Problem: Education: Goal: Understanding of cardiac disease, CV risk reduction, and recovery process will improve Outcome: Progressing   Problem: Activity: Goal: Ability to tolerate increased activity will improve Outcome: Progressing   Problem: Cardiac: Goal: Ability to achieve and maintain adequate cardiovascular perfusion will improve Outcome: Progressing

## 2023-10-18 NOTE — Progress Notes (Signed)
   Patient Name: Brooke [name] Date of Encounter: 10/18/2023 [facility] Cardiologist: New  Interval Summary  .     Mental status improved, she is A&Ox3. She denies chest pain. Says she has been up with the walker this morning. HS trop trending down. Echo showed normal LVEF  Vital Signs .    Vitals:   10/18/23 0052 10/18/23 0120 10/18/23 0546 10/18/23 0821  BP: (!) 153/97 (!) 153/97 102/67 (!) 150/97  Pulse: 84 84 71 75  Resp:   17   Temp:   (!) 97.5 F (36.4 C) 97.9 F (36.6 C)  TempSrc:   Axillary Oral  SpO2:   93% 97%  Weight:      Height:        Intake/Output Summary (Last 24 hours) at 10/18/2023 0851 Last data filed at 10/18/2023 0500 Gross per 24 hour  Intake 240 ml  Output 1151 ml  Net -911 ml      10/16/2023    7:55 PM 10/16/2023    1:36 PM 10/12/2023    2:43 PM  Last 3 Weights  Weight (lbs) 111 lb 1.8 oz 107 lb 109 lb 6.4 oz  Weight (kg) 50.4 kg 48.535 kg 49.624 kg      Telemetry/ECG    Nsr HR - Personally Reviewed  Physical Exam .   GEN: No acute distress.   Neck: No JVD Cardiac: RRR, no murmurs, rubs, or gallops.  Respiratory: Clear to auscultation bilaterally. GI: Soft, nontender, non-distended  MS: No edema  Assessment & Plan .    NSTEMI Chest pain - chest pain difficult to characterize. She does not remember chest pain on arrival. She denies any chest pain today. - EKG showed no ischemic changes - HS trop peak 433 now down trending - Chest CT showed coronary and aortic atherosclerosis - in the setting of sepsis form urinary source and recent death in the family - IV heparin not started d/t possible stress-induced CM. Also no recurrent chest pain and HS trop trending down - echo showed LVEF 60-65%, no Wma, G1DD, mildly reduced RVSF, mild to mod MR, mod dilated LA - continue ASA 81mg  daily, Lipitor 40mg  daily - given chest pain, may benefit from ischemic eval. Given uncertainty of chest pain and normal LVEF, non-invasive testing is  likely Ok. Cardiac CTA vs MPI.   HTN - Losartan held in the setting of hypotension  Ascending aortic aneurysm - chest CT showed 4cm ascending aortic aneurysm - repeat imaging in 1 year  Murmur  - echo showed normal LVEF with mild to mod MR and mild AS  For questions or updates, please contact [facility] Please consult [email or web address] for contact info under        Signed, Alexsis Kathman David Stall, PA-C

## 2023-10-18 NOTE — Plan of Care (Signed)
  Problem: Clinical Measurements: Goal: Respiratory complications will improve Outcome: Progressing Goal: Cardiovascular complication will be avoided Outcome: Progressing   Problem: Activity: Goal: Risk for activity intolerance will decrease Outcome: Progressing   Problem: Elimination: Goal: Will not experience complications related to urinary retention Outcome: Progressing   Problem: Pain Managment: Goal: General experience of comfort will improve and/or be controlled Outcome: Progressing   Problem: Safety: Goal: Ability to remain free from injury will improve Outcome: Progressing   Problem: Activity: Goal: Ability to tolerate increased activity will improve Outcome: Progressing   Problem: Clinical Measurements: Goal: Ability to maintain a body temperature in the normal range will improve Outcome: Progressing   Problem: Respiratory: Goal: Ability to maintain adequate ventilation will improve Outcome: Progressing Goal: Ability to maintain a clear airway will improve Outcome: Progressing   Problem: Coping: Goal: Level of anxiety will decrease Outcome: Not Progressing   Problem: Elimination: Goal: Will not experience complications related to bowel motility Outcome: Not Progressing

## 2023-10-18 NOTE — Progress Notes (Deleted)
 Cardiology Office Note  Date:  10/18/2023   ID:  Brooke [name], DOB Jan 09, 1946, MRN [id number]  PCP:  Jackolyn Confer, MD   No chief complaint on file.   HPI:  Ms. Brooke [name] is a 78 year old woman with past medical history of COPD Who presents by referral from Dr. Modena Nunnery for stress cardiomyopathy, elevated troponin, weakness     PMH:   has a past medical history of Asthma.  PSH:    Past Surgical History:  Procedure Laterality Date   PARTIAL HYSTERECTOMY     TEMPORAL ARTERY BIOPSY / LIGATION      No current facility-administered medications for this visit.   No current outpatient medications on file.   Facility-Administered Medications Ordered in Other Visits  Medication Dose Route Frequency Provider Last Rate Last Admin   acetaminophen (TYLENOL) tablet 650 mg  650 mg Oral Q4H PRN Core, Doy Hutching, MD   650 mg at 10/18/23 0052   albuterol (PROVENTIL) (2.5 MG/3ML) 0.083% nebulizer solution 2.5 mg  2.5 mg Nebulization Q4H PRN Core, Doy Hutching, MD       aspirin EC tablet 81 mg  81 mg Oral Daily Core, Doy Hutching, MD   81 mg at 10/18/23 0824   atorvastatin (LIPITOR) tablet 40 mg  40 mg Oral Daily Core, Doy Hutching, MD   40 mg at 10/18/23 4098   clonazePAM (KLONOPIN) tablet 0.5 mg  0.5 mg Oral BID PRN Wouk, Wilfred Curtis, MD       clonazePAM Scarlette Calico) tablet 1 mg  1 mg Oral QHS PRN Wouk, Wilfred Curtis, MD       enoxaparin (LOVENOX) injection 50 mg  50 mg Subcutaneous Q12H Hunt, Madison H, RPH   50 mg at 10/18/23 0825   FLUoxetine (PROZAC) capsule 40 mg  40 mg Oral BID Core, Doy Hutching, MD   40 mg at 10/18/23 1191   folic acid (FOLVITE) tablet 1 mg  1 mg Oral Daily Core, Doy Hutching, MD   1 mg at 10/18/23 0825   gabapentin (NEURONTIN) capsule 100 mg  100 mg Oral BID Wouk, Wilfred Curtis, MD       haloperidol (HALDOL) tablet 0.5 mg  0.5 mg Oral Q6H PRN Core, Doy Hutching, MD       ipratropium (ATROVENT) 0.06 % nasal spray 1 spray  1 spray Each Nare TID PRN Core, Doy Hutching, MD       iron  polysaccharides (NIFEREX) capsule 150 mg  150 mg Oral Daily Core, Doy Hutching, MD   150 mg at 10/17/23 0949   losartan (COZAAR) tablet 25 mg  25 mg Oral Daily Wouk, Wilfred Curtis, MD       montelukast (SINGULAIR) tablet 10 mg  10 mg Oral Daily Core, Doy Hutching, MD   10 mg at 10/18/23 0825   multivitamin with minerals tablet 1 tablet  1 tablet Oral Daily Core, Doy Hutching, MD   1 tablet at 10/18/23 0824   ondansetron (ZOFRAN) injection 4 mg  4 mg Intravenous Q6H PRN Core, Doy Hutching, MD   4 mg at 10/18/23 0823   pantoprazole (PROTONIX) EC tablet 40 mg  40 mg Oral Daily Core, Doy Hutching, MD   40 mg at 10/18/23 0824   polyethylene glycol (MIRALAX / GLYCOLAX) packet 17 g  17 g Oral Daily Core, Doy Hutching, MD       senna (SENOKOT) tablet 8.6 mg  1 tablet Oral QHS Core, Doy Hutching, MD       thiamine (VITAMIN  B1) tablet 100 mg  100 mg Oral Daily Core, Doy Hutching, MD   100 mg at 10/18/23 8469   Or   thiamine (VITAMIN B1) injection 100 mg  100 mg Intravenous Daily Core, Doy Hutching, MD         Allergies:   Nystatin and Sulfa antibiotics   Social History:  The patient  reports that she has never smoked. She has never used smokeless tobacco. She reports that she does not drink alcohol and does not use drugs.   Family History:   family history includes CAD in her father; Colon cancer in her father.    Review of Systems: ROS   PHYSICAL EXAM: VS:  There were no vitals taken for this visit. , BMI There is no height or weight on file to calculate BMI. GEN: Well nourished, well developed, in no acute distress HEENT: normal Neck: no JVD, carotid bruits, or masses Cardiac: RRR; no murmurs, rubs, or gallops,no edema  Respiratory:  clear to auscultation bilaterally, normal work of breathing GI: soft, nontender, nondistended, + BS MS: no deformity or atrophy Skin: warm and dry, no rash Neuro:  Strength and sensation are intact Psych: euthymic mood, full affect    Recent Labs: 10/16/2023: B Natriuretic Peptide 481.3;  Magnesium 1.7; TSH 3.104 10/18/2023: ALT 10; BUN 8; Creatinine, Ser 0.63; Hemoglobin 10.2; Platelets 225; Potassium 4.2; Sodium 135    Lipid Panel Lab Results  Component Value Date   CHOL 151 10/17/2023   HDL 34 (L) 10/17/2023   LDLCALC 103 (H) 10/17/2023   TRIG 71 10/17/2023      Wt Readings from Last 3 Encounters:  10/16/23 111 lb 1.8 oz (50.4 kg)  10/12/23 109 lb 6.4 oz (49.6 kg)  07/22/23 116 lb 12.8 oz (53 kg)       ASSESSMENT AND PLAN:  Problem List Items Addressed This Visit   None    Disposition:   F/U  12 months   Total encounter time more than 30 minutes  Greater than 50% was spent in counseling and coordination of care with the patient    Signed, Dossie Arbour, M.D., Ph.D. [facility] [place], [place] [phone]

## 2023-10-18 NOTE — Progress Notes (Signed)
 Pt requested DNR bracelet be removed because she had changed her mind about being DNR. When I called the daughter [name] who is POA suggested removing the bracelet for now and revisit issue in the morning. Daughter thinks that pt might be thinking she will get less care if she has the bracelet. After discussion when daughter is present then she can have doctor d/c DNR order if all are in agreement.

## 2023-10-18 NOTE — Progress Notes (Signed)
 Occupational Therapy Treatment Patient Details Name: Brooke [name] MRN: [id number] DOB: 11-22-1945 Today's Date: 10/18/2023   History of present illness 78 year old female with a past medical history of depression and anxiety, hypertension, irritable bowel syndrome and recent death of her husband approximately 2 weeks ago who presents with approximately 2 days of weakness and confusion with recent UTI. 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   OT comments  Pt in bed upon OT arrival, reporting desire to void urine before her upcoming test.  Pt ambulatory around bedside and into bathroom with RW and close supv.  Performed all functional transfers at bedside and toilet with close supv, with min vc for safe hand placement to avoid pulling from walker.  Completed standing grooming tasks with good tolerance and set up/supv.  Daughter verbalized wanting pt to transition from her tub baths at home to using a shower chair as daughter reports that she doesn't want to worry about trying to lift pt out of the tub.  OT advised on tub bar option which would anchor to edge of tub, advised on options to obtain, and also advised on 3 uses of 3in1 commode, if daughter prefers pt to perform a seated shower.  Daughter reports that she does already have a 3in1 but was receptive to tub bar option.  Pt will continue to benefit from skilled OT to maximize safety and tolerance for daily tasks in order to reduce burden of care on family.         If plan is discharge home, recommend the following:  A little help with bathing/dressing/bathroom;Help with stairs or ramp for entrance   Equipment Recommendations  Other (comment) (RW)    Recommendations for Other Services      Precautions / Restrictions Precautions Precautions: Fall Recall of Precautions/Restrictions: Intact Restrictions Weight Bearing Restrictions Per Provider Order: No       Mobility Bed Mobility Overal  bed mobility: Independent               Patient Response: Cooperative  Transfers Overall transfer level: Needs assistance Equipment used: Rolling walker (2 wheels) Transfers: Sit to/from Stand Sit to Stand: Supervision           General transfer comment: min vc for safe hand placement during STS from bedside     Balance Overall balance assessment: Needs assistance Sitting-balance support: Feet supported, No upper extremity supported Sitting balance-Leahy Scale: Normal Sitting balance - Comments: able to lean posteriorly and laterally to have hospital gown changed and pulled from underneath her buttocks while seated EOB with good return to midline from all directions.   Standing balance support: During functional activity, Single extremity supported Standing balance-Leahy Scale: Good Standing balance comment: Stood at sink to comb hair with 1 hand on sink countertop for support.  Able to manage hiking underwear in standing with close supv.                           ADL either performed or assessed with clinical judgement   ADL Overall ADL's : Needs assistance/impaired     Grooming: Brushing hair;Supervision/safety;Standing                   Toilet Transfer: Regular Toilet;Grab bars;Supervision/safety   Toileting- Clothing Manipulation and Hygiene: Sitting/lateral lean;Supervision/safety       Functional mobility during ADLs: Rolling walker (2 wheels);Supervision/safety General ADL Comments: Ambulatory in room, around bed, within bathroom to  complete BADLs with RW and close supv    Extremity/Trunk Assessment Upper Extremity Assessment Upper Extremity Assessment: Generalized weakness   Lower Extremity Assessment Lower Extremity Assessment: Generalized weakness   Cervical / Trunk Assessment Cervical / Trunk Assessment: Kyphotic    Vision       Perception     Praxis     Communication Communication Factors Affecting Communication:  Hearing impaired   Cognition Arousal: Alert Behavior During Therapy: WFL for tasks assessed/performed                                 Following commands: Intact        Cueing   Cueing Techniques: Verbal cues  Exercises      Shoulder Instructions       General Comments      Pertinent Vitals/ Pain       Pain Assessment Faces Pain Scale: Hurts a little bit Pain Location: chest Pain Descriptors / Indicators: Sore Pain Intervention(s): Limited activity within patient's tolerance, Monitored during session, Repositioned  Home Living                                          Prior Functioning/Environment              Frequency  Min 2X/week        Progress Toward Goals  OT Goals(current goals can now be found in the care plan section)  Progress towards OT goals: Progressing toward goals  Acute Rehab OT Goals Patient Stated Goal: To go home with daughter OT Goal Formulation: With patient Time For Goal Achievement: 10/31/23 Potential to Achieve Goals: Good  Plan                       AM-PAC OT "6 Clicks" Daily Activity     Outcome Measure   Help from another person eating meals?: None Help from another person taking care of personal grooming?: None Help from another person toileting, which includes using toliet, bedpan, or urinal?: A Little Help from another person bathing (including washing, rinsing, drying)?: A Little Help from another person to put on and taking off regular upper body clothing?: None Help from another person to put on and taking off regular lower body clothing?: A Little 6 Click Score: 21    End of Session Equipment Utilized During Treatment: Rolling walker (2 wheels);Gait belt  OT Visit Diagnosis: Other abnormalities of gait and mobility (R26.89);Muscle weakness (generalized) (M62.81)   Activity Tolerance Patient tolerated treatment well   Patient Left in bed;with family/visitor present;with  call bell/phone within reach   Nurse Communication Other (comment) (Notified RN of white pill found on pt's recliner and OT placed pill on computer cart within pt's room next to yellow pill already present on cart.)        Time: 6962-9528 OT Time Calculation (min): 28 min  Charges: OT General Charges $OT Visit: 1 Visit OT Treatments $Self Care/Home Management : 23-37 mins  Danelle Earthly, MS, OTR/L   Otis Dials 10/18/2023, 3:42 PM

## 2023-10-18 NOTE — Progress Notes (Addendum)
 PROGRESS NOTE    Brooke [name]  OZD:[id number] DOB: 12-25-1945 DOA: 10/16/2023 PCP: Jackolyn Confer, MD     Brief Narrative:   From admission h and p  This is a 78 year old female with a past medical history of depression and anxiety, hypertension, irritable bowel syndrome and recent death of her husband approximately 2 weeks ago who presents with approximately 2 days of weakness and confusion, the patient's daughter is at the bedside during review and reports the patient's systolic blood pressures noted in the 70s at home.  Of note the patient had a UTI 5 days ago which she was treated with Macrobid.  The patient reported chest tightness yesterday evening to the daughter as well as 2 episodes today but no fever chills shortness of breath no orthopnea the patient denies any specific nausea or vomiting however does report has been having issues with "diarrhea" although at the same time has been having to manually disimpact herself.  The patient's family member is concerned about overflow diarrhea.  The patient denies any homicidal or suicidal ideation despite her recent stressors.   The case discussed with the emergency room physician of note the patient received 1 L crystalloid while in the emergency department.   Past medical records reviewed and summarized the patient's office visit 06/02/2023 which was a Medicare wellness visit that point she had irritable bowel syndrome anxiety and dizziness as well as the patient's latest visit from October 12, 2023 for her Klonopin was increased to 1 mg at nighttime.  Assessment & Plan:   Principal Problem:   Non-ST elevation (NSTEMI) myocardial infarction [facility]) Active Problems:   Cystitis   Acute metabolic encephalopathy   Depression   COPD mixed type (HCC)   Hypotension   Constipation   Hyponatremia   Hiatal hernia   Essential hypertension   Rhinitis   Hypokalemia   Hypocalcemia   Iron deficiency   Pressure injury of skin  #  NSTEMI Symptoms resolved. Trops peaked in the 400s. TTE preserved EF, no wall motion abnormalities - cardiology following, coronary CT ordered for today. Likely demand. - not on heparin - on asa and statin at home  # Lower extremity weakness Bilateral. No trauma. Normal bowel function. Nothing focal. PT advising home health - does complain of chronic low back pain, will check mri of lumbar spine to evaluate spinal cord  # Debility PT advising home health  # COPD No exacerbation - home singulair  # Recent uti Asympotmatic, urinalysis unremarkable - monitor  # HTN Bp mild elevation - resume home losartan  # GAD - home clonazepam, gabapentin  # Ascending aortic aneurysm 4 cm - outpt surveillance   DVT prophylaxis: lovenox Code Status: dnr/dni Family Communication: daughter updated @ bedside 3/17  Level of care: Telemetry Cardiac Status is: Inpatient Remains inpatient appropriate because: severity of illness    Consultants:  cardiology  Procedures: none  Antimicrobials:  none    Subjective: Reports feeling ok at the moment  Objective: Vitals:   10/18/23 0052 10/18/23 0120 10/18/23 0546 10/18/23 0821  BP: (!) 153/97 (!) 153/97 102/67 (!) 150/97  Pulse: 84 84 71 75  Resp:   17   Temp:   (!) 97.5 F (36.4 C) 97.9 F (36.6 C)  TempSrc:   Axillary Oral  SpO2:   93% 97%  Weight:      Height:        Intake/Output Summary (Last 24 hours) at 10/18/2023 1121 Last data filed at 10/18/2023 0500 Gross  per 24 hour  Intake 240 ml  Output 750 ml  Net -510 ml   Filed Weights   10/16/23 1336 10/16/23 1955  Weight: 48.5 kg 50.4 kg    Examination:  General exam: Appears calm and comfortable  Respiratory system: Clear to auscultation. Respiratory effort normal. Cardiovascular system: S1 & S2 heard, RRR. Harsh systolic murmur Gastrointestinal system: Abdomen is nondistended, soft and nontender.   Central nervous system: Alert and oriented. No focal  neurological deficits. Extremities: Symmetric 5 x 5 power, symmetric Skin: No rashes, lesions or ulcers Psychiatry: Judgement and insight appear normal. Mood & affect appropriate.     Data Reviewed: I have personally reviewed following labs and imaging studies  CBC: Recent Labs  Lab 10/16/23 1332 10/17/23 0330 10/18/23 0623  WBC 9.9 7.1 5.6  HGB 11.3* 10.2* 10.2*  HCT 35.0* 31.3* 31.1*  MCV 93.1 90.7 90.7  PLT 198 198 225   Basic Metabolic Panel: Recent Labs  Lab 10/16/23 1332 10/16/23 1852 10/17/23 0025 10/17/23 0330 10/18/23 0623  NA 134*  --   --  133* 135  K 2.9*  --  3.3* 3.4* 4.2  CL 101  --   --  101 102  CO2 26  --   --  25 28  GLUCOSE 101*  --   --  114* 93  BUN 15  --   --  12 8  CREATININE 0.87  --   --  0.62 0.63  CALCIUM 7.7*  --   --  7.9* 8.4*  MG  --  1.7  --   --   --    GFR: Estimated Creatinine Clearance: 41.6 mL/min (by C-G formula based on SCr of 0.63 mg/dL). Liver Function Tests: Recent Labs  Lab 10/16/23 1957 10/17/23 0330 10/18/23 0623  AST 20 18 16   ALT 13 12 10   ALKPHOS 45 40 37*  BILITOT 0.6 0.5 0.4  PROT 6.0* 5.3* 5.1*  ALBUMIN 2.9* 2.3* 2.4*   No results for input(s): "LIPASE", "AMYLASE" in the last 168 hours. Recent Labs  Lab 10/16/23 1852  AMMONIA 14   Coagulation Profile: Recent Labs  Lab 10/16/23 1852  INR 1.2   Cardiac Enzymes: No results for input(s): "CKTOTAL", "CKMB", "CKMBINDEX", "TROPONINI" in the last 168 hours. BNP (last 3 results) No results for input(s): "PROBNP" in the last 8760 hours. HbA1C: Recent Labs    10/16/23 1852  HGBA1C 5.5   CBG: No results for input(s): "GLUCAP" in the last 168 hours. Lipid Profile: Recent Labs    10/17/23 0330  CHOL 151  HDL 34*  LDLCALC 103*  TRIG 71  CHOLHDL 4.4   Thyroid Function Tests: Recent Labs    10/16/23 1852  TSH 3.104   Anemia Panel: Recent Labs    10/16/23 1332 10/16/23 1852  VITAMINB12  --  376  FERRITIN 104  --   TIBC 190*  --    IRON 23*  --    Urine analysis:    Component Value Date/Time   COLORURINE AMBER (A) 10/16/2023 1049   APPEARANCEUR CLEAR (A) 10/16/2023 1049   APPEARANCEUR Hazy (A) 10/12/2023 1510   LABSPEC 1.017 10/16/2023 1049   PHURINE 5.0 10/16/2023 1049   GLUCOSEU NEGATIVE 10/16/2023 1049   HGBUR NEGATIVE 10/16/2023 1049   BILIRUBINUR NEGATIVE 10/16/2023 1049   BILIRUBINUR Negative 10/12/2023 1510   KETONESUR 5 (A) 10/16/2023 1049   PROTEINUR NEGATIVE 10/16/2023 1049   NITRITE NEGATIVE 10/16/2023 1049   LEUKOCYTESUR NEGATIVE 10/16/2023 1049   Sepsis Labs: @  LABRCNTIP(procalcitonin:4,lacticidven:4)  ) Recent Results (from the past 240 hours)  Microscopic Examination     Status: Abnormal   Collection Time: 10/12/23  3:10 PM   Urine  Result Value Ref Range Status   WBC, UA 6-10 (A) 0 - 5 /hpf Final   RBC, Urine 0-2 0 - 2 /hpf Final   Epithelial Cells (non renal) 0-10 0 - 10 /hpf Final   Mucus, UA Present (A) Not Estab. Final   Bacteria, UA Few (A) None seen/Few Final  Urine Culture     Status: Abnormal   Collection Time: 10/13/23  8:52 AM   Specimen: Urine   UR  Result Value Ref Range Status   Urine Culture, Routine Final report (A)  Final   Organism ID, Bacteria Escherichia coli (A)  Final    Comment: Cefazolin with an MIC <=16 predicts susceptibility to the oral agents cefaclor, cefdinir, cefpodoxime, cefprozil, cefuroxime, cephalexin, and loracarbef when used for therapy of uncomplicated urinary tract infections due to E. coli, Klebsiella pneumoniae, and Proteus mirabilis. 50,000-100,000 colony forming units per mL    Antimicrobial Susceptibility Comment  Final    Comment:       ** S = Susceptible; I = Intermediate; R = Resistant **                    P = Positive; N = Negative             MICS are expressed in micrograms per mL    Antibiotic                 RSLT#1    RSLT#2    RSLT#3    RSLT#4 Amoxicillin/Clavulanic Acid    S Ampicillin                     S Cefazolin                       S Cefepime                       S Cefoxitin                      S Cefpodoxime                    S Ceftriaxone                    S Ciprofloxacin                  S Ertapenem                      S Gentamicin                     S Levofloxacin                   S Meropenem                      S Nitrofurantoin                 S Piperacillin/Tazobactam        S Tetracycline                   S Tobramycin  S Trimethoprim/Sulfa             S   Resp panel by RT-PCR (RSV, Flu A&B, Covid) Anterior Nasal Swab     Status: None   Collection Time: 10/16/23  2:52 PM   Specimen: Anterior Nasal Swab  Result Value Ref Range Status   SARS Coronavirus 2 by RT PCR NEGATIVE NEGATIVE Final    Comment: (NOTE) SARS-CoV-2 target nucleic acids are NOT DETECTED.  The SARS-CoV-2 RNA is generally detectable in upper respiratory specimens during the acute phase of infection. The lowest concentration of SARS-CoV-2 viral copies this assay can detect is 138 copies/mL. A negative result does not preclude SARS-Cov-2 infection and should not be used as the sole basis for treatment or other patient management decisions. A negative result may occur with  improper specimen collection/handling, submission of specimen other than nasopharyngeal swab, presence of viral mutation(s) within the areas targeted by this assay, and inadequate number of viral copies(<138 copies/mL). A negative result must be combined with clinical observations, patient history, and epidemiological information. The expected result is Negative.  Fact Sheet for Patients:  BloggerCourse.com  Fact Sheet for Healthcare Providers:  SeriousBroker.it  This test is no t yet approved or cleared by the [place] FDA and  has been authorized for detection and/or diagnosis of SARS-CoV-2 by FDA under an Emergency Use Authorization (EUA). This EUA will remain   in effect (meaning this test can be used) for the duration of the COVID-19 declaration under Section 564(b)(1) of the Act, 21 U.S.C.section 360bbb-3(b)(1), unless the authorization is terminated  or revoked sooner.       Influenza A by PCR NEGATIVE NEGATIVE Final   Influenza B by PCR NEGATIVE NEGATIVE Final    Comment: (NOTE) The Xpert Xpress SARS-CoV-2/FLU/RSV plus assay is intended as an aid in the diagnosis of influenza from Nasopharyngeal swab specimens and should not be used as a sole basis for treatment. Nasal washings and aspirates are unacceptable for Xpert Xpress SARS-CoV-2/FLU/RSV testing.  Fact Sheet for Patients: BloggerCourse.com  Fact Sheet for Healthcare Providers: SeriousBroker.it  This test is not yet approved or cleared by the [place] FDA and has been authorized for detection and/or diagnosis of SARS-CoV-2 by FDA under an Emergency Use Authorization (EUA). This EUA will remain in effect (meaning this test can be used) for the duration of the COVID-19 declaration under Section 564(b)(1) of the Act, 21 U.S.C. section 360bbb-3(b)(1), unless the authorization is terminated or revoked.     Resp Syncytial Virus by PCR NEGATIVE NEGATIVE Final    Comment: (NOTE) Fact Sheet for Patients: BloggerCourse.com  Fact Sheet for Healthcare Providers: SeriousBroker.it  This test is not yet approved or cleared by the [place] FDA and has been authorized for detection and/or diagnosis of SARS-CoV-2 by FDA under an Emergency Use Authorization (EUA). This EUA will remain in effect (meaning this test can be used) for the duration of the COVID-19 declaration under Section 564(b)(1) of the Act, 21 U.S.C. section 360bbb-3(b)(1), unless the authorization is terminated or revoked.  Performed at [facility], [address], [place], [place] [address]           Radiology Studies: ECHOCARDIOGRAM COMPLETE Result Date: 10/17/2023    ECHOCARDIOGRAM REPORT   Patient Name:   DEVLYNN KNOFF Date of Exam: 10/17/2023 Medical Rec #:  [id number]       Height:       60.0 in Accession #:    [id number]  Weight:       111.1 lb Date of Birth:  May 31, 1946        BSA:          1.454 m Patient Age:    78 years        BP:           125/90 mmHg Patient Gender: F               HR:           75 bpm. Exam Location:  [facility] Procedure: 2D Echo, Cardiac Doppler and Color Doppler (Both Spectral and Color            Flow Doppler were utilized during procedure). Indications:     Abnormal ECG R94.31  History:         Patient has no prior history of Echocardiogram examinations.                  Risk Factors:Hypertension.  Sonographer:     Neysa Bonito Roar Referring Phys:  [id number] Raymon Mutton DUNN Diagnosing Phys: Rozell Searing Custovic IMPRESSIONS  1. Left ventricular ejection fraction, by estimation, is 60 to 65%. The left ventricle has normal function. The left ventricle has no regional wall motion abnormalities. Left ventricular diastolic parameters are consistent with Grade I diastolic dysfunction (impaired relaxation).  2. Right ventricular systolic function is mildly reduced. The right ventricular size is normal. There is mildly elevated pulmonary artery systolic pressure. The estimated right ventricular systolic pressure is 41.6 mmHg.  3. Left atrial size was mild to moderately dilated.  4. The mitral valve is normal in structure. Mild to moderate mitral valve regurgitation. No evidence of mitral stenosis.  5. The aortic valve is calcified. Aortic valve regurgitation is not visualized. Mild aortic valve stenosis.  6. The inferior vena cava is normal in size with greater than 50% respiratory variability, suggesting right atrial pressure of 3 mmHg. FINDINGS  Left Ventricle: Left ventricular ejection fraction, by estimation, is 60 to 65%. The left ventricle has normal function. The left ventricle has no  regional wall motion abnormalities. The left ventricular internal cavity size was normal in size. There is  no left ventricular hypertrophy. Left ventricular diastolic parameters are consistent with Grade I diastolic dysfunction (impaired relaxation). Right Ventricle: The right ventricular size is normal. No increase in right ventricular wall thickness. Right ventricular systolic function is mildly reduced. There is mildly elevated pulmonary artery systolic pressure. The tricuspid regurgitant velocity  is 2.58 m/s, and with an assumed right atrial pressure of 15 mmHg, the estimated right ventricular systolic pressure is 41.6 mmHg. Left Atrium: Left atrial size was mild to moderately dilated. Right Atrium: Right atrial size was normal in size. Pericardium: There is no evidence of pericardial effusion. Mitral Valve: The mitral valve is normal in structure. Mild to moderate mitral valve regurgitation. No evidence of mitral valve stenosis. MV peak gradient, 7.2 mmHg. The mean mitral valve gradient is 3.0 mmHg. Tricuspid Valve: The tricuspid valve is normal in structure. Tricuspid valve regurgitation is mild. Aortic Valve: The aortic valve is calcified. Aortic valve regurgitation is not visualized. Mild aortic stenosis is present. Aortic valve mean gradient measures 15.5 mmHg. Aortic valve peak gradient measures 30.9 mmHg. Aortic valve area, by VTI measures 1.23 cm. Pulmonic Valve: The pulmonic valve was normal in structure. Pulmonic valve regurgitation is not visualized. Aorta: The aortic root is normal in size and structure. Venous: The inferior vena cava is normal in size with greater than 50%  respiratory variability, suggesting right atrial pressure of 3 mmHg. IAS/Shunts: No atrial level shunt detected by color flow Doppler.  LEFT VENTRICLE PLAX 2D LVIDd:         3.60 cm   Diastology LVIDs:         2.60 cm   LV e' medial:    9.03 cm/s LV PW:         1.20 cm   LV E/e' medial:  7.4 LV IVS:        1.30 cm   LV e'  lateral:   5.77 cm/s LVOT diam:     2.00 cm   LV E/e' lateral: 11.6 LV SV:         63 LV SV Index:   43 LVOT Area:     3.14 cm  RIGHT VENTRICLE RV Basal diam:  3.30 cm RV Mid diam:    3.20 cm RV S prime:     8.16 cm/s TAPSE (M-mode): 1.8 cm LEFT ATRIUM             Index        RIGHT ATRIUM           Index LA diam:        3.60 cm 2.48 cm/m   RA Area:     18.50 cm LA Vol (A2C):   62.1 ml 42.71 ml/m  RA Volume:   49.80 ml  34.25 ml/m LA Vol (A4C):   53.2 ml 36.59 ml/m LA Biplane Vol: 57.9 ml 39.82 ml/m  AORTIC VALVE                     PULMONIC VALVE AV Area (Vmax):    1.14 cm      PV Vmax:        1.28 m/s AV Area (Vmean):   1.03 cm      PV Peak grad:   6.6 mmHg AV Area (VTI):     1.23 cm      RVOT Peak grad: 2 mmHg AV Vmax:           278.00 cm/s AV Vmean:          180.000 cm/s AV VTI:            0.510 m AV Peak Grad:      30.9 mmHg AV Mean Grad:      15.5 mmHg LVOT Vmax:         101.00 cm/s LVOT Vmean:        58.800 cm/s LVOT VTI:          0.200 m LVOT/AV VTI ratio: 0.39  AORTA Ao Root diam: 3.20 cm Ao Asc diam:  3.50 cm MITRAL VALVE                TRICUSPID VALVE MV Area (PHT): 5.58 cm     TR Peak grad:   26.6 mmHg MV Area VTI:   2.42 cm     TR Vmax:        258.00 cm/s MV Peak grad:  7.2 mmHg MV Mean grad:  3.0 mmHg     SHUNTS MV Vmax:       1.34 m/s     Systemic VTI:  0.20 m MV Vmean:      73.1 cm/s    Systemic Diam: 2.00 cm MV Decel Time: 136 msec MV E velocity: 67.10 cm/s MV A velocity: 124.00 cm/s MV E/A ratio:  0.54 MV A Prime:    11.4 cm/s  Rozell Searing Custovic Electronically signed by Clotilde Dieter Signature Date/Time: 10/17/2023/1:19:43 PM    Final    CT Angio Chest Pulmonary Embolism (PE) W or WO Contrast Result Date: 10/16/2023 CLINICAL DATA:  Pulmonary embolism (PE) suspected, high prob. Weakness. Positive D-dimer. EXAM: CT ANGIOGRAPHY CHEST WITH CONTRAST TECHNIQUE: Multidetector CT imaging of the chest was performed using the standard protocol during bolus administration of intravenous contrast.  Multiplanar CT image reconstructions and MIPs were obtained to evaluate the vascular anatomy. RADIATION DOSE REDUCTION: This exam was performed according to the departmental dose-optimization program which includes automated exposure control, adjustment of the mA and/or kV according to patient size and/or use of iterative reconstruction technique. CONTRAST:  75mL OMNIPAQUE IOHEXOL 350 MG/ML SOLN COMPARISON:  07/02/2009 FINDINGS: Cardiovascular: Heart is normal size. Slight aneurysmal dilatation of the ascending thoracic aorta, 4 cm. No filling defects in the pulmonary arteries to suggest pulmonary emboli. Coronary artery and aortic atherosclerosis. Mediastinum/Nodes: No mediastinal, hilar, or axillary adenopathy. Trachea and esophagus are unremarkable. Thyroid unremarkable. Lungs/Pleura: Small bilateral pleural effusions. Bilateral lower lobe airspace opacities. Patchy right upper lobe airspace opacity. Findings could reflect pneumonia. Upper Abdomen: Elevation of the left hemidiaphragm. No acute findings Musculoskeletal: Chest wall soft tissues are unremarkable. No acute bony abnormality. Review of the MIP images confirms the above findings. IMPRESSION: No evidence of pulmonary embolus. Small bilateral pleural effusions. Bilateral lower lobe and patchy right upper lobe airspace opacities concerning for pneumonia. Coronary artery disease. 4 cm ascending thoracic aortic aneurysm. Recommend annual imaging followup by CTA or MRA. This recommendation follows 2010 ACCF/AHA/AATS/ACR/ASA/SCA/SCAI/SIR/STS/SVM Guidelines for the Diagnosis and Management of Patients with Thoracic Aortic Disease. Circulation. 2010; 121: Z660-Y301. Aortic aneurysm NOS (ICD10-I71.9) Aortic Atherosclerosis (ICD10-I70.0). Electronically Signed   By: Charlett Nose M.D.   On: 10/16/2023 22:20   US Venous Img Lower Bilateral (DVT) Result Date: 10/16/2023 CLINICAL DATA:  78 year old female with history of positive D-dimer. EXAM: BILATERAL LOWER  EXTREMITY VENOUS DOPPLER ULTRASOUND TECHNIQUE: Gray-scale sonography with graded compression, as well as color Doppler and duplex ultrasound were performed to evaluate the lower extremity deep venous systems from the level of the common femoral vein and including the common femoral, femoral, profunda femoral, popliteal and calf veins including the posterior tibial, peroneal and gastrocnemius veins when visible. The superficial great saphenous vein was also interrogated. Spectral Doppler was utilized to evaluate flow at rest and with distal augmentation maneuvers in the common femoral, femoral and popliteal veins. COMPARISON:  None Available. FINDINGS: RIGHT LOWER EXTREMITY Common Femoral Vein: No evidence of thrombus. Normal compressibility, respiratory phasicity and response to augmentation. Saphenofemoral Junction: No evidence of thrombus. Normal compressibility and flow on color Doppler imaging. Profunda Femoral Vein: No evidence of thrombus. Normal compressibility and flow on color Doppler imaging. Femoral Vein: No evidence of thrombus. Normal compressibility, respiratory phasicity and response to augmentation. Popliteal Vein: No evidence of thrombus. Normal compressibility, respiratory phasicity and response to augmentation. Calf Veins: No evidence of thrombus. Normal compressibility and flow on color Doppler imaging. Other Findings:  None. LEFT LOWER EXTREMITY Common Femoral Vein: No evidence of thrombus. Normal compressibility, respiratory phasicity and response to augmentation. Saphenofemoral Junction: No evidence of thrombus. Normal compressibility and flow on color Doppler imaging. Profunda Femoral Vein: No evidence of thrombus. Normal compressibility and flow on color Doppler imaging. Femoral Vein: No evidence of thrombus. Normal compressibility, respiratory phasicity and response to augmentation. Popliteal Vein: No evidence of thrombus. Normal compressibility, respiratory phasicity and response to  augmentation. Calf Veins: No evidence of thrombus. Normal compressibility  and flow on color Doppler imaging. Other Findings:  None. IMPRESSION: No evidence of bilateral lower extremity deep venous thrombosis. Marliss Coots, MD [facility] Radiology Electronically Signed   By: Marliss Coots M.D.   On: 10/16/2023 21:29   DG Abd 2 Views Result Date: 10/16/2023 CLINICAL DATA:  Constipation EXAM: ABDOMEN - 2 VIEW COMPARISON:  None Available. FINDINGS: Supine and upright frontal views of the abdomen and pelvis are obtained. Bowel gas pattern is unremarkable without obstruction or ileus. Moderate fecal retention, most pronounced within the ascending and transverse colon. No masses or abnormal calcifications. No free gas in the greater peritoneal sac. Chronic elevation of the left hemidiaphragm. IMPRESSION: 1. Moderate fecal retention consistent with constipation. 2. No bowel obstruction or ileus. Electronically Signed   By: Sharlet Salina M.D.   On: 10/16/2023 19:37   DG Chest Port 1 View Result Date: 10/16/2023 CLINICAL DATA:  Weakness since yesterday. EXAM: PORTABLE CHEST 1 VIEW COMPARISON:  09/07/2023 FINDINGS: Chronic elevation of the left hemidiaphragm. Chronic cardiomegaly and aortic atherosclerosis. Chronic low volumes at the lung bases, similar to the prior examination. Upper lungs remain clear. No visible effusion. IMPRESSION: No active disease. Chronic elevation of the left hemidiaphragm. Chronic low volumes at the lung bases. Electronically Signed   By: Paulina Fusi M.D.   On: 10/16/2023 15:31   CT Head Wo Contrast Result Date: 10/16/2023 CLINICAL DATA:  Mental status change of unknown cause. EXAM: CT HEAD WITHOUT CONTRAST TECHNIQUE: Contiguous axial images were obtained from the base of the skull through the vertex without intravenous contrast. RADIATION DOSE REDUCTION: This exam was performed according to the departmental dose-optimization program which  includes automated exposure control, adjustment of the mA and/or kV according to patient size and/or use of iterative reconstruction technique. COMPARISON:  03/31/2016. FINDINGS: Brain: No evidence of acute infarction, hemorrhage, hydrocephalus, extra-axial collection or mass lesion/mass effect. Stable changes from a previous right suboccipital craniectomy. Vascular: Tortuous basilar artery. Dense atherosclerotic calcification along the basilar artery, stable. Skull: No fracture or bone lesion. Sinuses/Orbits: Visualized globes orbits are unremarkable. Previous sinus surgery. Other: None. IMPRESSION: 1. No acute findings. 2. Previous right suboccipital craniectomy. Electronically Signed   By: Amie Portland M.D.   On: 10/16/2023 15:10        Scheduled Meds:  aspirin EC  81 mg Oral Daily   atorvastatin  40 mg Oral Daily   enoxaparin (LOVENOX) injection  50 mg Subcutaneous Q12H   FLUoxetine  40 mg Oral BID   folic acid  1 mg Oral Daily   iron polysaccharides  150 mg Oral Daily   metoprolol tartrate  100 mg Oral Once   montelukast  10 mg Oral Daily   multivitamin with minerals  1 tablet Oral Daily   pantoprazole  40 mg Oral Daily   polyethylene glycol  17 g Oral Daily   senna  1 tablet Oral QHS   thiamine  100 mg Oral Daily   Or   thiamine  100 mg Intravenous Daily   Continuous Infusions:   LOS: 2 days     Silvano Bilis, MD [facility]   If 7PM-7AM, please contact night-coverage [email or web address] Password [facility] 10/18/2023, 11:21 AM

## 2023-10-18 NOTE — Progress Notes (Unsigned)
   10/18/2023 Name: Brooke [name] MRN: [id number] DOB: 1945/12/02  No chief complaint on file.   Brooke [name] is a 78 y.o. year old female who presented for a telephone visit.   They were referred to the pharmacist by their PCP for assistance in managing  COPD .    Subjective:  Care Team: Primary Care Provider: Jackolyn Confer, MD ; Next Scheduled Visit: *** {careteamprovider:27366}  Medication Access/Adherence  Current Pharmacy:  [facility] [phone] - [place], [place] - [address] [place] [address] Phone: [phone] Fax: [phone]  [facility] [address] [place] [address] Phone: [phone] Fax: [phone]   Patient reports affordability concerns with their medications: {YES/NO:21197} Patient reports access/transportation concerns to their pharmacy: {YES/NO:21197} Patient reports adherence concerns with their medications:  {YES/NO:21197} ***  Compliance packaging     COPD: Recent admission 3/13 for gen weakness, confusion, low BP ; sepsis  Recent death of husband    Sxs - SOB, hard to breathe when laying down or walking, chronic cough? Current medications: montelukast 10mg  daily    Reports 0 exacerbations in the past year  mMRC score: *** CAT score: ***  Current medication access support: ***  Plan - restart albuterol inhaler for prn use? Lama+ laba = anoro ellipta (umec + vilant) 62.5/25 1 inhal daily   Objective:  Lab Results  Component Value Date   HGBA1C 5.5 10/16/2023    Lab Results  Component Value Date   CREATININE 0.63 10/18/2023   BUN 8 10/18/2023   NA 135 10/18/2023   K 4.2 10/18/2023   CL 102 10/18/2023   CO2 28 10/18/2023    Lab Results  Component Value Date   CHOL 151 10/17/2023   HDL 34 (L) 10/17/2023   LDLCALC 103 (H) 10/17/2023   TRIG 71 10/17/2023   CHOLHDL 4.4 10/17/2023    Medications Reviewed Today   Medications were not reviewed in this encounter        Assessment/Plan:   COPD: - Currently {CHL Controlled/[phone]}.  - Reviewed appropriate inhaler technique. - Recommend to ***  - Meets financial criteria for *** patient assistance program through ***. Will collaborate with provider, CPhT, and patient to pursue assistance.    Follow Up Plan: ***  Verdene Rio, PharmD PGY1 Pharmacy Resident

## 2023-10-18 NOTE — Evaluation (Signed)
 Physical Therapy Evaluation Patient Details Name: Brooke [name] MRN: [id number] DOB: 1946/01/07 Today's Date: 10/18/2023  History of Present Illness  78 year old female with a past medical history of depression and anxiety, hypertension, irritable bowel syndrome and recent death of her husband approximately 2 weeks ago who presents with approximately 2 days of weakness and confusion with recent UTI. 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  Clinical Impression  Pt A&Ox4, reported generalized back and bilateral quad pain but did not quantify. At baseline the pt has moved in with her daughter, normally uses a SPC and furniture walks. She was able to perform transfers from commode with safety bar, supervision, from recliner with RW and supervision. One verbal cue for hand placement to maximize safety. She ambulated ~66ft, and after a seated rest break additional 26ft. Pt with decreased velocity, flexed posture but no LOB.  Overall the patient demonstrated deficits (see "PT Problem List") that impede the patient's functional abilities, safety, and mobility and would benefit from skilled PT intervention.        If plan is discharge home, recommend the following: Assistance with cooking/housework;Assist for transportation;Help with stairs or ramp for entrance   Can travel by private vehicle        Equipment Recommendations None recommended by PT (pt stated she has three types of walkers)  Recommendations for Other Services       Functional Status Assessment Patient has had a recent decline in their functional status and demonstrates the ability to make significant improvements in function in a reasonable and predictable amount of time.     Precautions / Restrictions Precautions Precautions: Fall Recall of Precautions/Restrictions: Intact Restrictions Weight Bearing Restrictions Per Provider Order: No      Mobility  Bed Mobility                General bed mobility comments: NT pt up in chair at start/end of session    Transfers Overall transfer level: Needs assistance Equipment used: Rolling walker (2 wheels) Transfers: Sit to/from Stand Sit to Stand: Supervision                Ambulation/Gait Ambulation/Gait assistance: Supervision Gait Distance (Feet):  (62ft, additional 32ft after seated rest break) Assistive device: Rolling walker (2 wheels)         General Gait Details: very slow, flexed posture. no true LOB  Stairs            Wheelchair Mobility     Tilt Bed    Modified Rankin (Stroke Patients Only)       Balance Overall balance assessment: Needs assistance Sitting-balance support: Feet supported, No upper extremity supported Sitting balance-Leahy Scale: Good     Standing balance support: No upper extremity supported, During functional activity, Single extremity supported Standing balance-Leahy Scale: Good Standing balance comment: able to perform pericare doffing/donning underwear                             Pertinent Vitals/Pain Pain Assessment Pain Assessment: 0-10 Pain Location: pt did not quantify but referenced bilat quad pain, back pain Pain Descriptors / Indicators: Aching, Sore Pain Intervention(s): Limited activity within patient's tolerance, Repositioned, Monitored during session    Home Living Family/patient expects to be discharged to:: Private residence Living Arrangements: Children (daughter) Available Help at Discharge: Family Type of Home: House Home Access: Stairs to enter   [employer] of Steps: "a couple"  Home Equipment: Cane - single point Additional Comments: pt reports recently moved in with her daughter, does not answer all home setup questions    Prior Function Prior Level of Function : Independent/Modified Independent             Mobility Comments: cane all the time for limited household mobility        Extremity/Trunk Assessment   Upper Extremity Assessment Upper Extremity Assessment: Defer to OT evaluation    Lower Extremity Assessment Lower Extremity Assessment: Generalized weakness    Cervical / Trunk Assessment Cervical / Trunk Assessment: Kyphotic  Communication        Cognition Arousal: Alert Behavior During Therapy: [facility] for tasks assessed/performed                             Following commands: Intact       Cueing       General Comments      Exercises     Assessment/Plan    PT Assessment Patient needs continued PT services  PT Problem List Decreased strength;Decreased activity tolerance;Decreased balance;Decreased mobility       PT Treatment Interventions DME instruction;Balance training;Gait training;Neuromuscular re-education;Stair training;Functional mobility training;Patient/family education;Therapeutic activities;Therapeutic exercise    PT Goals (Current goals can be found in the Care Plan section)  Acute Rehab PT Goals Patient Stated Goal: to go home PT Goal Formulation: With patient Time For Goal Achievement: 11/01/23 Potential to Achieve Goals: Good    Frequency Min 1X/week     Co-evaluation               AM-PAC PT "6 Clicks" Mobility  Outcome Measure Help needed turning from your back to your side while in a flat bed without using bedrails?: None Help needed moving from lying on your back to sitting on the side of a flat bed without using bedrails?: None Help needed moving to and from a bed to a chair (including a wheelchair)?: None Help needed standing up from a chair using your arms (e.g., wheelchair or bedside chair)?: None Help needed to walk in hospital room?: A Little Help needed climbing 3-5 steps with a railing? : A Little 6 Click Score: 22    End of Session   Activity Tolerance: Patient tolerated treatment well Patient left: in chair;with call bell/phone within reach Nurse Communication: Mobility  status PT Visit Diagnosis: Difficulty in walking, not elsewhere classified (R26.2);Muscle weakness (generalized) (M62.81)    Time: 4782-9562 PT Time Calculation (min) (ACUTE ONLY): 20 min   Charges:   PT Evaluation $PT Eval Low Complexity: 1 Low PT Treatments $Therapeutic Activity: 8-22 mins PT General Charges $$ ACUTE PT VISIT: 1 Visit         Olga Coaster PT, DPT 10:43 AM,10/18/23

## 2023-10-19 ENCOUNTER — Other Ambulatory Visit: Payer: Self-pay

## 2023-10-19 ENCOUNTER — Ambulatory Visit: Payer: Medicare Other | Admitting: Cardiovascular Disease

## 2023-10-19 ENCOUNTER — Other Ambulatory Visit (HOSPITAL_COMMUNITY): Payer: Self-pay

## 2023-10-19 DIAGNOSIS — I214 Non-ST elevation (NSTEMI) myocardial infarction: Secondary | ICD-10-CM | POA: Diagnosis not present

## 2023-10-19 DIAGNOSIS — I7121 Aneurysm of the ascending aorta, without rupture: Secondary | ICD-10-CM | POA: Insufficient documentation

## 2023-10-19 DIAGNOSIS — I2489 Other forms of acute ischemic heart disease: Secondary | ICD-10-CM

## 2023-10-19 LAB — LIPOPROTEIN A (LPA): Lipoprotein (a): <8.4 nmol/L (ref <75.0)

## 2023-10-19 MED ORDER — BUTALBITAL-ASPIRIN-CAFFEINE 50-325-40 MG PO CAPS: 1.0000 | ORAL_CAPSULE | Freq: Every day | ORAL | Status: DC | PRN

## 2023-10-19 MED ORDER — ASPIRIN 81 MG PO TBEC: 81.0000 mg | DELAYED_RELEASE_TABLET | Freq: Every day | ORAL | 1 refills | Status: DC

## 2023-10-19 MED ORDER — FUROSEMIDE 20 MG PO TABS: 20.0000 mg | ORAL_TABLET | Freq: Every day | ORAL | 1 refills | Status: DC

## 2023-10-19 MED ORDER — ATORVASTATIN CALCIUM 40 MG PO TABS: 40.0000 mg | ORAL_TABLET | Freq: Every day | ORAL | 1 refills | Status: DC

## 2023-10-19 MED ORDER — ENOXAPARIN SODIUM 40 MG/0.4ML IJ SOSY: 40.0000 mg | PREFILLED_SYRINGE | INTRAMUSCULAR | Status: DC

## 2023-10-19 NOTE — Care Management Important Message (Signed)
 Important Message  Patient Details  Name: Brooke [name] MRN: [id number] Date of Birth: 07-Oct-1945   Important Message Given:  Yes - Medicare IM     Cristela Blue, CMA 10/19/2023, 10:08 AM

## 2023-10-19 NOTE — Discharge Summary (Addendum)
 Brooke [name]:[id number] DOB: 01-14-1946 DOA: 10/16/2023  PCP: Jackolyn Confer, MD  Admit date: 10/16/2023 Discharge date: 10/19/2023  Time spent: 35 minutes  Recommendations for Outpatient Follow-up:  Pcp f/u  Cardiology f/u 3-4 weeks Bmp at f/u (new lasix at d/c)    Discharge Diagnoses:  Principal Problem:   Non-ST elevation (NSTEMI) myocardial infarction [facility]) Active Problems:   Cystitis   Acute metabolic encephalopathy   Depression   COPD mixed type (HCC)   Hypotension   Constipation   Hyponatremia   Hiatal hernia   Essential hypertension   Rhinitis   Hypokalemia   Hypocalcemia   Iron deficiency   Pressure injury of skin   Elevated troponin   Ascending aortic aneurysm (HCC)   Demand ischemia [facility])   Discharge Condition: stable  Diet recommendation: heart healthy  Filed Weights   10/16/23 1336 10/16/23 1955  Weight: 48.5 kg 50.4 kg    History of present illness:  From admission h and p This is a 78 year old female with a past medical history of depression and anxiety, hypertension, irritable bowel syndrome and recent death of her husband approximately 2 weeks ago who presents with approximately 2 days of weakness and confusion, the patient's daughter is at the bedside during review and reports the patient's systolic blood pressures noted in the 70s at home.  Of note the patient had a UTI 5 days ago which she was treated with Macrobid.  The patient reported chest tightness yesterday evening to the daughter as well as 2 episodes today but no fever chills shortness of breath no orthopnea the patient denies any specific nausea or vomiting however does report has been having issues with "diarrhea" although at the same time has been having to manually disimpact herself.  The patient's family member is concerned about overflow diarrhea.  The patient denies any homicidal or suicidal ideation despite her recent stressors.   The case discussed with the emergency room  physician of note the patient received 1 L crystalloid while in the emergency department.   Past medical records reviewed and summarized the patient's office visit 06/02/2023 which was a Medicare wellness visit that point she had irritable bowel syndrome anxiety and dizziness as well as the patient's latest visit from October 12, 2023 for her Klonopin was increased to 1 mg at nighttime.  Hospital Course:   # NSTEMI Symptoms resolved. Trops peaked in the 400s. TTE preserved EF, no wall motion abnormalities - cardiology following, coronary CT ordered showing mild/mod cad. - start asa/statin and lasix per cardiology - cardiology f/u 3-4 weeks   # Lower extremity weakness Bilateral. No trauma. Normal bowel function. Nothing focal. PT advising home health as does OT. MRI lumbar spine nothing acute.   # Debility PT advising home health which we have ordered. Has all DME she needs.   # COPD No exacerbation - home singulair   # Recent uti Asympotmatic, urinalysis unremarkable   # HTN Bp mild elevation - resumed home losartan   # GAD - home clonazepam, gabapentin - advise gradual taper as outpatient   # Ascending aortic aneurysm 4 cm - outpt surveillance  Procedures: none   Consultations: cardiology  Discharge Exam: Vitals:   10/19/23 0422 10/19/23 0800  BP: 132/79 (!) 123/104  Pulse: 70 65  Resp: 16 16  Temp: 98.1 F (36.7 C) 98.5 F (36.9 C)  SpO2: 98% 99%    General exam: Appears calm and comfortable  Respiratory system: Clear to auscultation. Respiratory effort normal. Cardiovascular  system: S1 & S2 heard, RRR. Harsh systolic murmur Gastrointestinal system: Abdomen is nondistended, soft and nontender.   Central nervous system: Alert and oriented. No focal neurological deficits. Extremities: Symmetric 5 x 5 power, symmetric Skin: No rashes, lesions or ulcers Psychiatry: Judgement and insight appear normal. Mood & affect appropriate.     Discharge  Instructions   Discharge Instructions     Diet - low sodium heart healthy   Complete by: As directed    Increase activity slowly   Complete by: As directed    No wound care   Complete by: As directed       Allergies as of 10/19/2023       Reactions   Nystatin Swelling   Sulfa Antibiotics Rash        Medication List     STOP taking these medications    nitrofurantoin (macrocrystal-monohydrate) 100 MG capsule Commonly known as: Macrobid       TAKE these medications    aspirin EC 81 MG tablet Take 1 tablet (81 mg total) by mouth daily. Swallow whole. Start taking on: October 20, 2023   atorvastatin 40 MG tablet Commonly known as: LIPITOR Take 1 tablet (40 mg total) by mouth daily. Start taking on: October 20, 2023   butalbital-aspirin-caffeine 50-325-40 MG capsule Commonly known as: FIORINAL Take 1 capsule by mouth daily as needed for headache or migraine. What changed: when to take this   clonazePAM 0.5 MG tablet Commonly known as: KLONOPIN Take 1 tablet (0.5 mg total) by mouth 2 (two) times daily as needed for anxiety.   clonazePAM 1 MG tablet Commonly known as: KLONOPIN Take 1 tablet (1 mg total) by mouth at bedtime.   FLUoxetine 20 MG capsule Commonly known as: PROzac Take 2 capsules (40 mg total) by mouth 2 (two) times daily. What changed: when to take this   furosemide 20 MG tablet Commonly known as: Lasix Take 1 tablet (20 mg total) by mouth daily.   gabapentin 100 MG capsule Commonly known as: NEURONTIN Take 1 capsule (100 mg total) by mouth 2 (two) times daily.   ipratropium 0.06 % nasal spray Commonly known as: ATROVENT Place 1 spray into both nostrils 3 (three) times daily as needed for rhinitis.   loperamide 2 MG tablet Commonly known as: IMODIUM A-D Take 2 tablets (4 mg total) by mouth 4 (four) times daily as needed for diarrhea or loose stools.   losartan 25 MG tablet Commonly known as: COZAAR Take 25 mg by mouth in the morning  and at bedtime.   montelukast 10 MG tablet Commonly known as: SINGULAIR Take 10 mg by mouth daily.   omeprazole 20 MG capsule Commonly known as: PRILOSEC Take 1 capsule (20 mg total) by mouth 2 (two) times daily before a meal.   ondansetron 4 MG tablet Commonly known as: Zofran Take 1 tablet (4 mg total) by mouth every 8 (eight) hours as needed for nausea or vomiting.   promethazine 12.5 MG tablet Commonly known as: PHENERGAN Take 1 tablet (12.5 mg total) by mouth every 6 (six) hours as needed for nausea or vomiting (If Zofran is not working).   traZODone 100 MG tablet Commonly known as: DESYREL Take 100 mg by mouth at bedtime as needed for sleep.       Allergies  Allergen Reactions   Nystatin Swelling   Sulfa Antibiotics Rash    Follow-up Information     End, Cristal Deer, MD Follow up.   Specialty: Cardiology Why: follow up  with dr. end or one of his associates in 3-4 weeks Contact information: [address] [place] [address] [phone]         Jackolyn Confer, MD Follow up.   Specialty: Family Medicine Contact information: [address] [place] [address] [phone]                  The results of significant diagnostics from this hospitalization (including imaging, microbiology, ancillary and laboratory) are listed below for reference.    Significant Diagnostic Studies: CT CORONARY MORPH W/CTA COR W/SCORE W/CA W/CM &/OR WO/CM Addendum Date: 10/18/2023 ADDENDUM REPORT: 10/18/2023 21:16 EXAM: OVER-READ INTERPRETATION  CT CHEST The following report is an over-read performed by radiologist Dr. Isac Sarna [facility] on 10/18/2023. This over-read does not include interpretation of cardiac or coronary anatomy or pathology. The CTA coronary angiogram interpretation by the cardiologist is attached. COMPARISON:  CTA chest 10/16/2023 FINDINGS: Cardiovascular: Mild panchamber cardiomegaly. There are calcifications of the  coronary arteries and mitral ring, aortic atherosclerosis. Slightly prominent pulmonary trunk 2.8 cm, no arterial filling defects as far seen on this partial chest CT. Mediastinum/nodes: No adenopathy in the visualized chest. Lungs/pleura: Stable small pleural effusions and adjacent atelectasis. Eventration and elevation versus paresis of the left hemidiaphragm also again noted. Remaining imaged lungs are clear. Upper abdomen: No acute findings. Musculoskeletal: Degenerative disc changes and spondylosis visualized thoracic spine. Unremarkable visualized chest wall. IMPRESSION: 1. Small pleural effusions with adjacent atelectasis. 2. Elevation with eventration or paresis left hemidiaphragm. 3. Aortic and coronary artery atherosclerosis. 4. Slightly prominent pulmonary trunk. 5. No interval change in the visualized chest since the CTA chest from 2 days ago. Electronically Signed   By: Almira Bar M.D.   On: 10/18/2023 21:16   Result Date: 10/18/2023 CLINICAL DATA:  Chest pain, elevated troponin EXAM: Cardiac/Coronary  CTA TECHNIQUE: The patient was scanned on a Siemens Somatom scanner. : A retrospective scan was triggered in the ascending thoracic aorta. Axial non-contrast 3 mm slices were carried out through the heart. The data set was analyzed on a dedicated work station and scored using the Agatson method. Gantry rotation speed was 66 msecs and collimation was .6 mm. 100mg  of metoprolol and 0.8 mg of sl NTG was given. The 3D data set was reconstructed in 5% intervals of the 60-95 % of the R-R cycle. Diastolic phases were analyzed on a dedicated work station using MPR, MIP and VRT modes. The patient received 80 cc of contrast. FINDINGS: Aorta:  Normal size.  No calcifications.  No dissection. Aortic Valve:  Trileaflet.  Mild calcifications. Coronary Arteries:  Normal coronary origin.  Right dominance. RCA is a dominant artery. There is calcified plaque proximally causing minimal stenosis (<25%). Left main gives  rise to LAD and LCX arteries. LM has no disease. LAD has calcified plaque proximally causing mild stenosis (25-49%). LCX is a non-dominant artery. There is calcified plaque proximally causing minimal stenosis (<25%). Other findings: Normal pulmonary vein drainage into the left atrium. Normal left atrial appendage without a thrombus. Normal size of the pulmonary artery. IMPRESSION: 1. Coronary calcium score of 605. This was 85th percentile for age and sex matched control. 2. Normal coronary origin with right dominance. 3. Mild LAD stenosis (25-49%). 4. Minimal RCA and LCx stenosis (<25%) 5. CAD-RADS 2. Mild non-obstructive CAD (25-49%). Consider non-atherosclerotic causes of chest pain. Consider preventive therapy and risk factor modification. Electronically Signed: By: Debbe Odea M.D. On: 10/18/2023 17:15   MR  LUMBAR SPINE WO CONTRAST Result Date: 10/18/2023 CLINICAL DATA:  Lower extremity weakness EXAM: MRI LUMBAR SPINE WITHOUT CONTRAST TECHNIQUE: Multiplanar, multisequence MR imaging of the lumbar spine was performed. No intravenous contrast was administered. COMPARISON:  None Available. FINDINGS: Segmentation:  Standard. Alignment: Grade 1 retrolisthesis at T12-L1 and grade 1 anterolisthesis at L4-L5 Vertebrae:  No fracture, evidence of discitis, or bone lesion. Conus medullaris and cauda equina: Conus extends to the L2 level. Conus and cauda equina appear normal. Paraspinal and other soft tissues: Negative. Disc levels: T12-L1: Small disc bulge with endplate spurring.  No stenosis. L1-L2: Normal disc space and facet joints. No spinal canal stenosis. No neural foraminal stenosis. L2-L3: Left asymmetric disc bulge with mild narrowing of the left lateral recess. No spinal canal stenosis. No neural foraminal stenosis. L3-L4: Small disc bulge with left-sided endplate spurring. No spinal canal stenosis. No neural foraminal stenosis. L4-L5: Severe facet arthrosis with disc uncovering. No spinal canal  stenosis. Mild right and severe left neural foraminal stenosis. L5-S1: Small disc bulge. No spinal canal stenosis. No neural foraminal stenosis. Visualized sacrum: Normal. IMPRESSION: 1. Severe left L4-L5 neural foraminal stenosis due to combination of disc uncovering and facet arthrosis. 2. No spinal canal stenosis. Electronically Signed   By: Deatra Robinson M.D.   On: 10/18/2023 17:03   ECHOCARDIOGRAM COMPLETE Result Date: 10/17/2023    ECHOCARDIOGRAM REPORT   Patient Name:   Brooke [name] Date of Exam: 10/17/2023 Medical Rec #:  [id number]       Height:       60.0 in Accession #:    [id number]      Weight:       111.1 lb Date of Birth:  June 07, 1946        BSA:          1.454 m Patient Age:    78 years        BP:           125/90 mmHg Patient Gender: F               HR:           75 bpm. Exam Location:  [facility] Procedure: 2D Echo, Cardiac Doppler and Color Doppler (Both Spectral and Color            Flow Doppler were utilized during procedure). Indications:     Abnormal ECG R94.31  History:         Patient has no prior history of Echocardiogram examinations.                  Risk Factors:Hypertension.  Sonographer:     Neysa Bonito Roar Referring Phys:  [id number] Raymon Mutton DUNN Diagnosing Phys: Rozell Searing Custovic IMPRESSIONS  1. Left ventricular ejection fraction, by estimation, is 60 to 65%. The left ventricle has normal function. The left ventricle has no regional wall motion abnormalities. Left ventricular diastolic parameters are consistent with Grade I diastolic dysfunction (impaired relaxation).  2. Right ventricular systolic function is mildly reduced. The right ventricular size is normal. There is mildly elevated pulmonary artery systolic pressure. The estimated right ventricular systolic pressure is 41.6 mmHg.  3. Left atrial size was mild to moderately dilated.  4. The mitral valve is normal in structure. Mild to moderate mitral valve regurgitation. No evidence of mitral stenosis.  5. The aortic valve is calcified.  Aortic valve regurgitation is not visualized. Mild aortic valve stenosis.  6. The inferior vena cava is normal in size with greater than  50% respiratory variability, suggesting right atrial pressure of 3 mmHg. FINDINGS  Left Ventricle: Left ventricular ejection fraction, by estimation, is 60 to 65%. The left ventricle has normal function. The left ventricle has no regional wall motion abnormalities. The left ventricular internal cavity size was normal in size. There is  no left ventricular hypertrophy. Left ventricular diastolic parameters are consistent with Grade I diastolic dysfunction (impaired relaxation). Right Ventricle: The right ventricular size is normal. No increase in right ventricular wall thickness. Right ventricular systolic function is mildly reduced. There is mildly elevated pulmonary artery systolic pressure. The tricuspid regurgitant velocity  is 2.58 m/s, and with an assumed right atrial pressure of 15 mmHg, the estimated right ventricular systolic pressure is 41.6 mmHg. Left Atrium: Left atrial size was mild to moderately dilated. Right Atrium: Right atrial size was normal in size. Pericardium: There is no evidence of pericardial effusion. Mitral Valve: The mitral valve is normal in structure. Mild to moderate mitral valve regurgitation. No evidence of mitral valve stenosis. MV peak gradient, 7.2 mmHg. The mean mitral valve gradient is 3.0 mmHg. Tricuspid Valve: The tricuspid valve is normal in structure. Tricuspid valve regurgitation is mild. Aortic Valve: The aortic valve is calcified. Aortic valve regurgitation is not visualized. Mild aortic stenosis is present. Aortic valve mean gradient measures 15.5 mmHg. Aortic valve peak gradient measures 30.9 mmHg. Aortic valve area, by VTI measures 1.23 cm. Pulmonic Valve: The pulmonic valve was normal in structure. Pulmonic valve regurgitation is not visualized. Aorta: The aortic root is normal in size and structure. Venous: The inferior vena cava is  normal in size with greater than 50% respiratory variability, suggesting right atrial pressure of 3 mmHg. IAS/Shunts: No atrial level shunt detected by color flow Doppler.  LEFT VENTRICLE PLAX 2D LVIDd:         3.60 cm   Diastology LVIDs:         2.60 cm   LV e' medial:    9.03 cm/s LV PW:         1.20 cm   LV E/e' medial:  7.4 LV IVS:        1.30 cm   LV e' lateral:   5.77 cm/s LVOT diam:     2.00 cm   LV E/e' lateral: 11.6 LV SV:         63 LV SV Index:   43 LVOT Area:     3.14 cm  RIGHT VENTRICLE RV Basal diam:  3.30 cm RV Mid diam:    3.20 cm RV S prime:     8.16 cm/s TAPSE (M-mode): 1.8 cm LEFT ATRIUM             Index        RIGHT ATRIUM           Index LA diam:        3.60 cm 2.48 cm/m   RA Area:     18.50 cm LA Vol (A2C):   62.1 ml 42.71 ml/m  RA Volume:   49.80 ml  34.25 ml/m LA Vol (A4C):   53.2 ml 36.59 ml/m LA Biplane Vol: 57.9 ml 39.82 ml/m  AORTIC VALVE                     PULMONIC VALVE AV Area (Vmax):    1.14 cm      PV Vmax:        1.28 m/s AV Area (Vmean):   1.03 cm  PV Peak grad:   6.6 mmHg AV Area (VTI):     1.23 cm      RVOT Peak grad: 2 mmHg AV Vmax:           278.00 cm/s AV Vmean:          180.000 cm/s AV VTI:            0.510 m AV Peak Grad:      30.9 mmHg AV Mean Grad:      15.5 mmHg LVOT Vmax:         101.00 cm/s LVOT Vmean:        58.800 cm/s LVOT VTI:          0.200 m LVOT/AV VTI ratio: 0.39  AORTA Ao Root diam: 3.20 cm Ao Asc diam:  3.50 cm MITRAL VALVE                TRICUSPID VALVE MV Area (PHT): 5.58 cm     TR Peak grad:   26.6 mmHg MV Area VTI:   2.42 cm     TR Vmax:        258.00 cm/s MV Peak grad:  7.2 mmHg MV Mean grad:  3.0 mmHg     SHUNTS MV Vmax:       1.34 m/s     Systemic VTI:  0.20 m MV Vmean:      73.1 cm/s    Systemic Diam: 2.00 cm MV Decel Time: 136 msec MV E velocity: 67.10 cm/s MV A velocity: 124.00 cm/s MV E/A ratio:  0.54 MV A Prime:    11.4 cm/s Designer, multimedia signed by Clotilde Dieter Signature Date/Time: 10/17/2023/1:19:43 PM    Final     CT Angio Chest Pulmonary Embolism (PE) W or WO Contrast Result Date: 10/16/2023 CLINICAL DATA:  Pulmonary embolism (PE) suspected, high prob. Weakness. Positive D-dimer. EXAM: CT ANGIOGRAPHY CHEST WITH CONTRAST TECHNIQUE: Multidetector CT imaging of the chest was performed using the standard protocol during bolus administration of intravenous contrast. Multiplanar CT image reconstructions and MIPs were obtained to evaluate the vascular anatomy. RADIATION DOSE REDUCTION: This exam was performed according to the departmental dose-optimization program which includes automated exposure control, adjustment of the mA and/or kV according to patient size and/or use of iterative reconstruction technique. CONTRAST:  75mL OMNIPAQUE IOHEXOL 350 MG/ML SOLN COMPARISON:  07/02/2009 FINDINGS: Cardiovascular: Heart is normal size. Slight aneurysmal dilatation of the ascending thoracic aorta, 4 cm. No filling defects in the pulmonary arteries to suggest pulmonary emboli. Coronary artery and aortic atherosclerosis. Mediastinum/Nodes: No mediastinal, hilar, or axillary adenopathy. Trachea and esophagus are unremarkable. Thyroid unremarkable. Lungs/Pleura: Small bilateral pleural effusions. Bilateral lower lobe airspace opacities. Patchy right upper lobe airspace opacity. Findings could reflect pneumonia. Upper Abdomen: Elevation of the left hemidiaphragm. No acute findings Musculoskeletal: Chest wall soft tissues are unremarkable. No acute bony abnormality. Review of the MIP images confirms the above findings. IMPRESSION: No evidence of pulmonary embolus. Small bilateral pleural effusions. Bilateral lower lobe and patchy right upper lobe airspace opacities concerning for pneumonia. Coronary artery disease. 4 cm ascending thoracic aortic aneurysm. Recommend annual imaging followup by CTA or MRA. This recommendation follows 2010 ACCF/AHA/AATS/ACR/ASA/SCA/SCAI/SIR/STS/SVM Guidelines for the Diagnosis and Management of Patients with  Thoracic Aortic Disease. Circulation. 2010; 121: W098-J191. Aortic aneurysm NOS (ICD10-I71.9) Aortic Atherosclerosis (ICD10-I70.0). Electronically Signed   By: Charlett Nose M.D.   On: 10/16/2023 22:20   US Venous Img Lower Bilateral (DVT) Result Date: 10/16/2023 CLINICAL DATA:  78 year old female  with history of positive D-dimer. EXAM: BILATERAL LOWER EXTREMITY VENOUS DOPPLER ULTRASOUND TECHNIQUE: Gray-scale sonography with graded compression, as well as color Doppler and duplex ultrasound were performed to evaluate the lower extremity deep venous systems from the level of the common femoral vein and including the common femoral, femoral, profunda femoral, popliteal and calf veins including the posterior tibial, peroneal and gastrocnemius veins when visible. The superficial great saphenous vein was also interrogated. Spectral Doppler was utilized to evaluate flow at rest and with distal augmentation maneuvers in the common femoral, femoral and popliteal veins. COMPARISON:  None Available. FINDINGS: RIGHT LOWER EXTREMITY Common Femoral Vein: No evidence of thrombus. Normal compressibility, respiratory phasicity and response to augmentation. Saphenofemoral Junction: No evidence of thrombus. Normal compressibility and flow on color Doppler imaging. Profunda Femoral Vein: No evidence of thrombus. Normal compressibility and flow on color Doppler imaging. Femoral Vein: No evidence of thrombus. Normal compressibility, respiratory phasicity and response to augmentation. Popliteal Vein: No evidence of thrombus. Normal compressibility, respiratory phasicity and response to augmentation. Calf Veins: No evidence of thrombus. Normal compressibility and flow on color Doppler imaging. Other Findings:  None. LEFT LOWER EXTREMITY Common Femoral Vein: No evidence of thrombus. Normal compressibility, respiratory phasicity and response to augmentation. Saphenofemoral Junction: No evidence of thrombus. Normal compressibility and flow  on color Doppler imaging. Profunda Femoral Vein: No evidence of thrombus. Normal compressibility and flow on color Doppler imaging. Femoral Vein: No evidence of thrombus. Normal compressibility, respiratory phasicity and response to augmentation. Popliteal Vein: No evidence of thrombus. Normal compressibility, respiratory phasicity and response to augmentation. Calf Veins: No evidence of thrombus. Normal compressibility and flow on color Doppler imaging. Other Findings:  None. IMPRESSION: No evidence of bilateral lower extremity deep venous thrombosis. Marliss Coots, MD [facility] Electronically Signed   By: Marliss Coots M.D.   On: 10/16/2023 21:29   DG Abd 2 Views Result Date: 10/16/2023 CLINICAL DATA:  Constipation EXAM: ABDOMEN - 2 VIEW COMPARISON:  None Available. FINDINGS: Supine and upright frontal views of the abdomen and pelvis are obtained. Bowel gas pattern is unremarkable without obstruction or ileus. Moderate fecal retention, most pronounced within the ascending and transverse colon. No masses or abnormal calcifications. No free gas in the greater peritoneal sac. Chronic elevation of the left hemidiaphragm. IMPRESSION: 1. Moderate fecal retention consistent with constipation. 2. No bowel obstruction or ileus. Electronically Signed   By: Sharlet Salina M.D.   On: 10/16/2023 19:37   DG Chest Port 1 View Result Date: 10/16/2023 CLINICAL DATA:  Weakness since yesterday. EXAM: PORTABLE CHEST 1 VIEW COMPARISON:  09/07/2023 FINDINGS: Chronic elevation of the left hemidiaphragm. Chronic cardiomegaly and aortic atherosclerosis. Chronic low volumes at the lung bases, similar to the prior examination. Upper lungs remain clear. No visible effusion. IMPRESSION: No active disease. Chronic elevation of the left hemidiaphragm. Chronic low volumes at the lung bases. Electronically Signed   By: Paulina Fusi M.D.   On: 10/16/2023 15:31   CT Head Wo  Contrast Result Date: 10/16/2023 CLINICAL DATA:  Mental status change of unknown cause. EXAM: CT HEAD WITHOUT CONTRAST TECHNIQUE: Contiguous axial images were obtained from the base of the skull through the vertex without intravenous contrast. RADIATION DOSE REDUCTION: This exam was performed according to the departmental dose-optimization program which includes automated exposure control, adjustment of the mA and/or kV according to patient size and/or use of iterative reconstruction technique. COMPARISON:  03/31/2016. FINDINGS: Brain: No evidence of acute infarction, hemorrhage, hydrocephalus, extra-axial collection  or mass lesion/mass effect. Stable changes from a previous right suboccipital craniectomy. Vascular: Tortuous basilar artery. Dense atherosclerotic calcification along the basilar artery, stable. Skull: No fracture or bone lesion. Sinuses/Orbits: Visualized globes orbits are unremarkable. Previous sinus surgery. Other: None. IMPRESSION: 1. No acute findings. 2. Previous right suboccipital craniectomy. Electronically Signed   By: Amie Portland M.D.   On: 10/16/2023 15:10    Microbiology: Recent Results (from the past 240 hours)  Microscopic Examination     Status: Abnormal   Collection Time: 10/12/23  3:10 PM   Urine  Result Value Ref Range Status   WBC, UA 6-10 (A) 0 - 5 /hpf Final   RBC, Urine 0-2 0 - 2 /hpf Final   Epithelial Cells (non renal) 0-10 0 - 10 /hpf Final   Mucus, UA Present (A) Not Estab. Final   Bacteria, UA Few (A) None seen/Few Final  Urine Culture     Status: Abnormal   Collection Time: 10/13/23  8:52 AM   Specimen: Urine   UR  Result Value Ref Range Status   Urine Culture, Routine Final report (A)  Final   Organism ID, Bacteria Escherichia coli (A)  Final    Comment: Cefazolin with an MIC <=16 predicts susceptibility to the oral agents cefaclor, cefdinir, cefpodoxime, cefprozil, cefuroxime, cephalexin, and loracarbef when used for therapy of uncomplicated  urinary tract infections due to E. coli, Klebsiella pneumoniae, and Proteus mirabilis. 50,000-100,000 colony forming units per mL    Antimicrobial Susceptibility Comment  Final    Comment:       ** S = Susceptible; I = Intermediate; R = Resistant **                    P = Positive; N = Negative             MICS are expressed in micrograms per mL    Antibiotic                 RSLT#1    RSLT#2    RSLT#3    RSLT#4 Amoxicillin/Clavulanic Acid    S Ampicillin                     S Cefazolin                      S Cefepime                       S Cefoxitin                      S Cefpodoxime                    S Ceftriaxone                    S Ciprofloxacin                  S Ertapenem                      S Gentamicin                     S Levofloxacin                   S Meropenem  S Nitrofurantoin                 S Piperacillin/Tazobactam        S Tetracycline                   S Tobramycin                     S Trimethoprim/Sulfa             S   Resp panel by RT-PCR (RSV, Flu A&B, Covid) Anterior Nasal Swab     Status: None   Collection Time: 10/16/23  2:52 PM   Specimen: Anterior Nasal Swab  Result Value Ref Range Status   SARS Coronavirus 2 by RT PCR NEGATIVE NEGATIVE Final    Comment: (NOTE) SARS-CoV-2 target nucleic acids are NOT DETECTED.  The SARS-CoV-2 RNA is generally detectable in upper respiratory specimens during the acute phase of infection. The lowest concentration of SARS-CoV-2 viral copies this assay can detect is 138 copies/mL. A negative result does not preclude SARS-Cov-2 infection and should not be used as the sole basis for treatment or other patient management decisions. A negative result may occur with  improper specimen collection/handling, submission of specimen other than nasopharyngeal swab, presence of viral mutation(s) within the areas targeted by this assay, and inadequate number of viral copies(<138 copies/mL). A negative  result must be combined with clinical observations, patient history, and epidemiological information. The expected result is Negative.  Fact Sheet for Patients:  BloggerCourse.com  Fact Sheet for Healthcare Providers:  SeriousBroker.it  This test is no t yet approved or cleared by the [place] FDA and  has been authorized for detection and/or diagnosis of SARS-CoV-2 by FDA under an Emergency Use Authorization (EUA). This EUA will remain  in effect (meaning this test can be used) for the duration of the COVID-19 declaration under Section 564(b)(1) of the Act, 21 U.S.C.section 360bbb-3(b)(1), unless the authorization is terminated  or revoked sooner.       Influenza A by PCR NEGATIVE NEGATIVE Final   Influenza B by PCR NEGATIVE NEGATIVE Final    Comment: (NOTE) The Xpert Xpress SARS-CoV-2/FLU/RSV plus assay is intended as an aid in the diagnosis of influenza from Nasopharyngeal swab specimens and should not be used as a sole basis for treatment. Nasal washings and aspirates are unacceptable for Xpert Xpress SARS-CoV-2/FLU/RSV testing.  Fact Sheet for Patients: BloggerCourse.com  Fact Sheet for Healthcare Providers: SeriousBroker.it  This test is not yet approved or cleared by the [place] FDA and has been authorized for detection and/or diagnosis of SARS-CoV-2 by FDA under an Emergency Use Authorization (EUA). This EUA will remain in effect (meaning this test can be used) for the duration of the COVID-19 declaration under Section 564(b)(1) of the Act, 21 U.S.C. section 360bbb-3(b)(1), unless the authorization is terminated or revoked.     Resp Syncytial Virus by PCR NEGATIVE NEGATIVE Final    Comment: (NOTE) Fact Sheet for Patients: BloggerCourse.com  Fact Sheet for Healthcare Providers: SeriousBroker.it  This  test is not yet approved or cleared by the [place] FDA and has been authorized for detection and/or diagnosis of SARS-CoV-2 by FDA under an Emergency Use Authorization (EUA). This EUA will remain in effect (meaning this test can be used) for the duration of the COVID-19 declaration under Section 564(b)(1) of the Act, 21 U.S.C. section 360bbb-3(b)(1), unless the authorization is terminated or revoked.  Performed at [facility], [address] [place], [place] [address]      Labs: Basic Metabolic Panel: Recent Labs  Lab 10/16/23 1332 10/16/23 1852 10/17/23 0025 10/17/23 0330 10/18/23 0623  NA 134*  --   --  133* 135  K 2.9*  --  3.3* 3.4* 4.2  CL 101  --   --  101 102  CO2 26  --   --  25 28  GLUCOSE 101*  --   --  114* 93  BUN 15  --   --  12 8  CREATININE 0.87  --   --  0.62 0.63  CALCIUM 7.7*  --   --  7.9* 8.4*  MG  --  1.7  --   --   --    Liver Function Tests: Recent Labs  Lab 10/16/23 1957 10/17/23 0330 10/18/23 0623  AST 20 18 16   ALT 13 12 10   ALKPHOS 45 40 37*  BILITOT 0.6 0.5 0.4  PROT 6.0* 5.3* 5.1*  ALBUMIN 2.9* 2.3* 2.4*   No results for input(s): "LIPASE", "AMYLASE" in the last 168 hours. Recent Labs  Lab 10/16/23 1852  AMMONIA 14   CBC: Recent Labs  Lab 10/16/23 1332 10/17/23 0330 10/18/23 0623  WBC 9.9 7.1 5.6  HGB 11.3* 10.2* 10.2*  HCT 35.0* 31.3* 31.1*  MCV 93.1 90.7 90.7  PLT 198 198 225   Cardiac Enzymes: No results for input(s): "CKTOTAL", "CKMB", "CKMBINDEX", "TROPONINI" in the last 168 hours. BNP: BNP (last 3 results) Recent Labs    10/16/23 1852  BNP 481.3*    ProBNP (last 3 results) No results for input(s): "PROBNP" in the last 8760 hours.  CBG: No results for input(s): "GLUCAP" in the last 168 hours.     Signed:  Silvano Bilis MD.  [facility] 10/19/2023, 11:44 AM

## 2023-10-19 NOTE — Progress Notes (Addendum)
   10/19/2023  Patient ID: Brooke [name], female   DOB: 1946/07/19, 78 y.o.   MRN: [id number]  Outreach to patient's daughter (DPR) for scheduled telephone visit to assist with medications was successful, but patient is currently hospitalized with expected d/c of this afternoon.  Daughter endorses medication changes will likely be made, so we have scheduled telephone visit for tomorrow afternoon to review current regimen and discuss medication adherence.  Patient's husband recently passed away, and she has moved in with her daughter who will assist in managing medications moving forward.  [name], PharmD, DPLA

## 2023-10-19 NOTE — Plan of Care (Signed)
  Problem: Education: Goal: Understanding of cardiac disease, CV risk reduction, and recovery process will improve Outcome: Progressing Goal: Individualized Educational Video(s) Outcome: Progressing   Problem: Activity: Goal: Ability to tolerate increased activity will improve Outcome: Progressing   Problem: Cardiac: Goal: Ability to achieve and maintain adequate cardiovascular perfusion will improve Outcome: Progressing   Problem: Health Behavior/Discharge Planning: Goal: Ability to safely manage health-related needs after discharge will improve Outcome: Progressing   Problem: Education: Goal: Knowledge of General Education information will improve Description: Including pain rating scale, medication(s)/side effects and non-pharmacologic comfort measures Outcome: Progressing   Problem: Health Behavior/Discharge Planning: Goal: Ability to manage health-related needs will improve Outcome: Progressing   Problem: Clinical Measurements: Goal: Ability to maintain clinical measurements within normal limits will improve Outcome: Progressing Goal: Will remain free from infection Outcome: Progressing Goal: Diagnostic test results will improve Outcome: Progressing Goal: Respiratory complications will improve Outcome: Progressing Goal: Cardiovascular complication will be avoided Outcome: Progressing   Problem: Activity: Goal: Risk for activity intolerance will decrease Outcome: Progressing   Problem: Nutrition: Goal: Adequate nutrition will be maintained Outcome: Progressing   Problem: Coping: Goal: Level of anxiety will decrease Outcome: Progressing   Problem: Elimination: Goal: Will not experience complications related to bowel motility Outcome: Progressing Goal: Will not experience complications related to urinary retention Outcome: Progressing   Problem: Pain Managment: Goal: General experience of comfort will improve and/or be controlled Outcome: Progressing    Problem: Safety: Goal: Ability to remain free from injury will improve Outcome: Progressing   Problem: Skin Integrity: Goal: Risk for impaired skin integrity will decrease Outcome: Progressing   Problem: Activity: Goal: Ability to tolerate increased activity will improve Outcome: Progressing   Problem: Clinical Measurements: Goal: Ability to maintain a body temperature in the normal range will improve Outcome: Progressing   Problem: Respiratory: Goal: Ability to maintain adequate ventilation will improve Outcome: Progressing Goal: Ability to maintain a clear airway will improve Outcome: Progressing

## 2023-10-19 NOTE — Progress Notes (Signed)
   Patient Name: Brooke [name] Date of Encounter: 10/19/2023 [facility] Cardiologist: New  Interval Summary  .    Cardiac CTA showed nonobstructive CAD. She denies chest pain or SOB. Possible D/c.  Vital Signs .    Vitals:   10/18/23 1935 10/18/23 2311 10/19/23 0422 10/19/23 0800  BP: 130/81 135/84 132/79 (!) 123/104  Pulse: 64 75 70 65  Resp: 16 17 16 16   Temp: 98.1 F (36.7 C) 98.1 F (36.7 C) 98.1 F (36.7 C) 98.5 F (36.9 C)  TempSrc: Oral Oral Oral Oral  SpO2: 96% 98% 98% 99%  Weight:      Height:        Intake/Output Summary (Last 24 hours) at 10/19/2023 0900 Last data filed at 10/18/2023 1900 Gross per 24 hour  Intake 240 ml  Output --  Net 240 ml      10/16/2023    7:55 PM 10/16/2023    1:36 PM 10/12/2023    2:43 PM  Last 3 Weights  Weight (lbs) 111 lb 1.8 oz 107 lb 109 lb 6.4 oz  Weight (kg) 50.4 kg 48.535 kg 49.624 kg      Telemetry/ECG    Nsr HR 60s - Personally Reviewed  Physical Exam .   GEN: No acute distress.   Neck: No JVD Cardiac: RRR, no murmurs, rubs, or gallops.  Respiratory: Clear to auscultation bilaterally. GI: Soft, nontender, non-distended  MS: No edema  Assessment & Plan .    NSTEMI Chest pain - chest pain difficult to characterize. She did not remember chest pain on arrival.  - EKG showed no ischemic changes - HS trop peak 433 now down trending - Chest CT showed coronary and aortic atherosclerosis - in the setting of sepsis form urinary source and recent death in the family - IV heparin not started d/t possible stress-induced CM. Also no recurrent chest pain and HS trop trending down - echo showed LVEF 60-65%, no Wma, G1DD, mildly reduced RVSF, mild to mod MR, mod dilated LA - Cardiac CTA showed coronary calcium score of 605, 85th percentile for age and sex matched, mild LAD stenosis and mid RCA and Lcx stenosis, overall mild nonobstructive cAD - continue ASA 81mg  daily, Lipitor 40mg  daily   HTN - Losartan 25mg   daily - BP good   Ascending aortic aneurysm - chest CT showed 4cm ascending aortic aneurysm - repeat imaging in 1 year   Murmur  - echo showed normal LVEF with mild to mod MR and mild AS   For questions or updates, please contact [facility] Please consult [email or web address] for contact info under        Signed, Abran Gavigan David Stall, PA-C

## 2023-10-19 NOTE — TOC Transition Note (Signed)
 Transition of Care [facility]) - Discharge Note   Patient Details  Name: Brooke [name] MRN: [id number] Date of Birth: January 01, 1946  [facility] CM/SW Contact:  Margarito Liner, LCSW Phone Number: 10/19/2023, 12:35 PM   Clinical Narrative:  Patient has orders to discharge home today. She is agreeable to home health services. No agency preference. She gave CSW permission to call her daughter. Daughter stated patient was set up with [facility] prior to admission. CSW called liaison and confirmed she was active with RN. They can add PT, OT, and social worker in case she needs placement from home. No further concerns. Daughter will pick her up around 3:30. CSW signing off.   Final next level of care: Home w Home Health Services Barriers to Discharge: No Barriers Identified   Patient Goals and CMS Choice            Discharge Placement                Patient to be transferred to facility by: Daughter Name of family member notified: [name] Patient and family notified of of transfer: 10/19/23  Discharge Plan and Services Additional resources added to the After Visit Summary for                            [facility] Arranged: RN, PT, OT, Social Work [employer] Agency: [employer] [facility] Home Health Services Date [facility] Agency Contacted: 10/19/23   Representative spoke with at [facility] Agency: Elnita Maxwell  Social Drivers of Health (SDOH) Interventions SDOH Screenings   Food Insecurity: No Food Insecurity (10/16/2023)  Housing: Low Risk  (10/16/2023)  Transportation Needs: No Transportation Needs (10/16/2023)  Utilities: Not At Risk (10/16/2023)  Alcohol Screen: Low Risk  (06/02/2023)  Depression (PHQ2-9): Low Risk  (10/14/2023)  Social Connections: Moderately Isolated (10/16/2023)  Tobacco Use: Low Risk  (10/16/2023)  Health Literacy: Adequate Health Literacy (06/02/2023)     Readmission Risk Interventions     No data to display

## 2023-10-20 ENCOUNTER — Ambulatory Visit: Payer: Medicare Other | Admitting: Pediatrics

## 2023-10-20 ENCOUNTER — Other Ambulatory Visit: Payer: Self-pay

## 2023-10-20 ENCOUNTER — Other Ambulatory Visit: Payer: Self-pay | Admitting: Pediatrics

## 2023-10-20 ENCOUNTER — Telehealth: Payer: Self-pay

## 2023-10-20 LAB — CALCIUM, IONIZED: Calcium, Ionized, Serum: 4.8 mg/dL (ref 4.5–5.6)

## 2023-10-20 NOTE — Transitions of Care (Post Inpatient/ED Visit) (Signed)
   10/20/2023  Name: Brooke [name] MRN: [id number] DOB: [birth year]  Today's TOC FU Call Status: Today's TOC FU Call Status:: Unsuccessful Call (1st Attempt)  Attempted to reach the patient regarding the most recent Inpatient/ED visit. Use mobile number which goes to Daughter, listed in (DPR). Patient attended PCP hospital follow up today at 1pm and had a telephonic outreach by pharmacy at 230 with notes in EPIC.   Follow Up Plan: Additional outreach attempts will be made to reach the patient to complete the Transitions of Care (Post Inpatient/ED visit) call.    Gabriel Cirri MSN, RN RN Case Sales executive Health  [facility]-Population Health Office Hours Wed/Thur  8:00 am-6:00 pm Direct Dial: [phone] Main Phone [phone]  Fax: [phone] [email or web address]

## 2023-10-20 NOTE — Progress Notes (Signed)
   10/20/2023  Patient ID: Brooke [name], female   DOB: 11-18-1945, 78 y.o.   MRN: [id number]  Subjective/objective Telephone visit with patient's daughter, Brooke [name]), to review current medications and assist with adherence  Medication management/adherence -Patient discharged yesterday-hospitalized for NSTEMI -Reviewed discharge medication list with daughter to address questions, concerns, and provide education on medications and their administration -Daughter is currently managing medications and using weekly pillbox for maintenance medications.  Only giving as needed medications if needed/requested by patient.  Likely that patient was previously using as needed medications routinely, possibly more than prescribed. -Daughter would like patient to receive medications in pill packs delivered to the home through [facility]  Assessment/plan  Medication management/adherence The following discrepancies were identified and addressed during medication review: -Fluoxetine 20 mg capsule prescription in chart reflects 2 capsules twice daily, but patient's home bottle is 20 mg tablets with directions to take once daily.  Recommended that patient take to 20 mg capsules in the morning based on dosing recommendations and long half-life of this medication. -Losartan 25 mg tablet directions state for patient to take twice daily, but cardiology note prior to hospital discharge states for patient to take once daily.  Recommended patient take 25 mg twice daily and to monitor blood pressure daily 30 minutes after first dose of medication.  If blood pressure consistently <110/60, decrease dose to once daily. -Trazodone 100 mg tablet daily and clonazepam 1 mg tablet daily prescribed to take at bedtime, but patient has only been taking clonazepam 1 mg since recently prescribed.  Daughter states trazodone 100 mg tablet seems to cause more residual fog the next day.  Recommended continuing this  regimen until seen by Dr. Evelene Croon next week.  -Contacting Dr. Evelene Croon to make her aware of above information.  Once patient is seen next week, will contact [facility] outpatient pharmacy to set up adherence packaging and home delivery. -Made patient aware that no controlled substances or as needed medications will be included and adherence packaging  Follow up: Will notify patient's daughter once everything is set up with [facility], so I can give her an idea of when first medication shipment will arrive  [name], PharmD, DPLA

## 2023-10-20 NOTE — Consult Note (Signed)
 [facility] Liaison Consult Note  10/20/2023  Brooke [name] [birth year] [id number]  Primary Care Provider:  Modena Nunnery, MD ([facility])  Patient is currently active with Care Management for chronic disease management services.  Patient has been engaged by a Tourist information centre manager.  Our community based plan of care has focused on disease management and community resource support.   Patient will receive a post hospital call and will be evaluated for assessments and disease process education.   Of note, Care Management services does not replace or interfere with any services that are needed or arranged by inpatient [facility] care management team.   For additional questions or referrals please contact:  Elliot Cousin, RN, BSN Hospital Liaison [place]   [facility], Population Health Office Hours MTWF  8:00 am-6:00 pm Direct Dial: [phone] mobile [email or web address]

## 2023-10-21 ENCOUNTER — Telehealth: Payer: Self-pay

## 2023-10-21 NOTE — Telephone Encounter (Signed)
 Requested Prescriptions  Pending Prescriptions Disp Refills   omeprazole (PRILOSEC) 20 MG capsule [Pharmacy Med Name: OMEPRAZOLE DR 20 MG CAPSULE] 180 capsule 0    Sig: TAKE 1 CAPSULE (20 MG TOTAL) BY MOUTH 2 (TWO) TIMES DAILY BEFORE A MEAL.     Gastroenterology: Proton Pump Inhibitors Passed - 10/21/2023  1:49 PM      Passed - Valid encounter within last 12 months    Recent Outpatient Visits           3 months ago Viral gastroenteritis   [facility] Jackolyn Confer, MD   3 months ago Irritable bowel syndrome with both constipation and diarrhea   [facility] Jackolyn Confer, MD   4 months ago Dizziness   [facility] Jackolyn Confer, MD   4 months ago Irritable bowel syndrome with both constipation and diarrhea   [facility] Jackolyn Confer, MD   5 months ago Allergic reaction to drug, subsequent encounter   [facility] Weber Cooks, NP       Future Appointments             In 2 weeks Fransico Michael, Cadence H, PA-C [facility] [place]

## 2023-10-21 NOTE — Transitions of Care (Post Inpatient/ED Visit) (Addendum)
 10/21/2023  Name: Brooke [name] MRN: [id number] DOB: [birth year]  Today's TOC FU Call Status: Today's TOC FU Call Status:: Successful TOC FU Call Completed TOC FU Call Complete Date: 10/21/23 Patient's Name and Date of Birth confirmed.  Transition Care Management Follow-up Telephone Call Date of Discharge: 10/19/23 Discharge Facility: [facility]) Type of Discharge: Inpatient Admission Primary Inpatient Discharge Diagnosis:: Non-ST elevation (NSTEMI) myocardial infarction How have you been since you were released from the hospital?: Better Any questions or concerns?: No  Items Reviewed: Did you receive and understand the discharge instructions provided?: Yes Medications obtained,verified, and reconciled?: Yes (Medications Reviewed) (Medication reconciliation completed based on recent discharge summary Patient taking medications as instructed and is aware of any changes or dosage adjustments medication regimen. Patient denies questions and reports no barriers to medication adherence) Any new allergies since your discharge?: No Dietary orders reviewed?: Yes Type of Diet Ordered:: Reg Heart Healthy Do you have support at home?: Yes People in Home: child(ren), adult Name of Support/Comfort Primary Source: Recently moved in with daughter  Medications Reviewed Today: Medications Reviewed Today     Reviewed by Johnnette Barrios, RN (Registered Nurse) on 10/21/23 at [phone]  Med List Status: <None>   Medication Order Taking? Sig Documenting Provider Last Dose Status Informant  aspirin EC 81 MG tablet [id number] Yes Take 1 tablet (81 mg total) by mouth daily. Swallow whole. Wouk, Wilfred Curtis, MD Taking Active   atorvastatin (LIPITOR) 40 MG tablet [id number] Yes Take 1 tablet (40 mg total) by mouth daily. Wouk, Wilfred Curtis, MD Taking Active   butalbital-aspirin-caffeine [facility]) [phone] MG capsule [id number] No Take 1 capsule by mouth daily as needed for headache  or migraine.  Patient not taking: Reported on 10/21/2023   Kathrynn Running, MD Not Taking Active            Med Note Sharon Seller, Tray Klayman L   Thu Oct 21, 2023  9:23 AM) Hold baby ASA if used   clonazePAM (KLONOPIN) 0.5 MG tablet [id number] Yes Take 1 tablet (0.5 mg total) by mouth 2 (two) times daily as needed for anxiety. Jackolyn Confer, MD Taking Active Self, Child, Pharmacy Records  clonazePAM [facility]) 1 MG tablet [id number] Yes Take 1 tablet (1 mg total) by mouth at bedtime. Larae Grooms, NP Taking Active Self, Child, Pharmacy Records  FLUoxetine (PROZAC) 20 MG capsule [id number]  Take 2 capsules (40 mg total) by mouth 2 (two) times daily.  Patient taking differently: Take 40 mg by mouth daily.   Jackolyn Confer, MD  Expired 10/20/23 2359 Self, Child, Pharmacy Records           Med Note [facility], MELISSA A   Sat Oct 16, 2023  4:32 PM)    furosemide (LASIX) 20 MG tablet [id number] Yes Take 1 tablet (20 mg total) by mouth daily. Wouk, Wilfred Curtis, MD Taking Active   gabapentin (NEURONTIN) 100 MG capsule [id number] Yes Take 1 capsule (100 mg total) by mouth 2 (two) times daily. Jackolyn Confer, MD Taking Active Self, Child, Pharmacy Records  ipratropium (ATROVENT) 0.06 % nasal spray [id number] Yes Place 1 spray into both nostrils 3 (three) times daily as needed for rhinitis. Jackolyn Confer, MD Taking Active Self, Child, Pharmacy Records           Med Note [facility], MELISSA A   Sat Oct 16, 2023  4:34 PM)    loperamide (IMODIUM A-D) 2 MG tablet [id number] No Take 2 tablets (4 mg total)  by mouth 4 (four) times daily as needed for diarrhea or loose stools.  Patient not taking: Reported on 10/20/2023   Larae Grooms, NP Not Taking Active Self, Child, Pharmacy Records  losartan (COZAAR) 25 MG tablet [id number] Yes Take 25 mg by mouth in the morning and at bedtime. [provider] Taking Active Self, Child, Pharmacy Records  montelukast (SINGULAIR) 10 MG tablet [id number] Yes Take 10 mg by  mouth daily. [provider] Taking Active Self, Child, Pharmacy Records  omeprazole (PRILOSEC) 20 MG capsule [id number] Yes Take 1 capsule (20 mg total) by mouth 2 (two) times daily before a meal. Jackolyn Confer, MD Taking Active Self, Child, Pharmacy Records  ondansetron [facility]) 4 MG tablet [id number] No Take 1 tablet (4 mg total) by mouth every 8 (eight) hours as needed for nausea or vomiting.  Patient not taking: Reported on 10/20/2023   Jackolyn Confer, MD Not Taking Active Self, Child, Pharmacy Records  promethazine (PHENERGAN) 12.5 MG tablet [id number] No Take 1 tablet (12.5 mg total) by mouth every 6 (six) hours as needed for nausea or vomiting (If Zofran is not working).  Patient not taking: Reported on 10/20/2023   Arnetha Courser, MD Not Taking Active Self, Child, Pharmacy Records  traZODone (DESYREL) 100 MG tablet [id number] No Take 100 mg by mouth at bedtime as needed for sleep.  Patient not taking: Reported on 10/20/2023   [provider] Not Taking Active Self, Child, Pharmacy Records           Med Note (Jaya Lapka L   Thu Oct 21, 2023  9:21 AM) Has PRN, using Klonopin at this time           Medication reconciliation / review completed based on most recent discharge summary and EHR medication list. Confirmed patient is taking all newly prescribed medications as instructed (any discrepancies are noted in review section)   Patient / Caregiver is aware of any changes to and / or  any dosage adjustments to medication regimen. Patient/ Caregiver denies questions at this time and reports no barriers to medication adherence.   Medication changes and updates noted and reviewed  START taking: aspirin EC Start taking on: October 20, 2023 atorvastatin (LIPITOR) Start taking on: October 20, 2023 furosemide (Lasix) CHANGE how you take: butalbital-aspirin-caffeine Benny Lennert) STOP taking: nitrofurantoin (macrocrystal-monohydrate) 100 MG capsule (Macrobid)   Home Care and  Equipment/Supplies: Were Home Health Services Ordered?: Yes [facility] of Home Health Agency:: [facility]  [phone] Has Agency set up a time to come to your home?: Yes First Home Health Visit Date: 10/22/23 Any new equipment or medical supplies ordered?: No  Functional Questionnaire: Do you need assistance with bathing/showering or dressing?: Yes Do you need assistance with meal preparation?: Yes Do you need assistance with eating?: No Do you have difficulty maintaining continence: No Do you need assistance with getting out of bed/getting out of a chair/moving?: Yes Do you have difficulty managing or taking your medications?: Yes  Follow up appointments reviewed: PCP Follow-up appointment confirmed?: Yes Date of PCP follow-up appointment?: 10/20/23 Follow-up Provider: Modena Nunnery Specialist [facility] Follow-up appointment confirmed?: Yes Date of Specialist follow-up appointment?: 11/08/23 Follow-Up Specialty Provider:: [facility]  [facility] with Cadence David Stall Reason Specialist Follow-Up Not Confirmed: Patient has Specialist Provider Number and will Call for Appointment (recc. to schedule in 4 weeks) Do you need transportation to your follow-up appointment?: No Do you understand care options if your condition(s) worsen?: Yes-patient verbalized understanding  SDOH Interventions Today  Flowsheet Row Most Recent Value  SDOH Interventions   Food Insecurity Interventions Intervention Not Indicated  Housing Interventions Intervention Not Indicated  Transportation Interventions Intervention Not Indicated, Patient Resources (Friends/Family), Payor Benefit  Utilities Interventions Intervention Not Indicated      Interventions Today    Flowsheet Row Most Recent Value  Chronic Disease   Chronic disease during today's visit Other  [UTI, NSTEMI]  General Interventions   General Interventions Discussed/Reviewed General Interventions Discussed, General Interventions  Reviewed, Doctor Visits, Referral to Nurse, Communication with  Doctor Visits Discussed/Reviewed Doctor Visits Discussed, Doctor Visits Reviewed, PCP, Specialist  PCP/Specialist Visits Compliance with follow-up visit  Communication with PCP/Specialists, RN, Pharmacists, Social Work  [Prior  Amb referral with visits scheduled with SW next 3/27  Pharmacist completed 3/19]  Exercise Interventions   Exercise Discussed/Reviewed Exercise Reviewed, Physical Activity  Physical Activity Discussed/Reviewed Physical Activity Reviewed  Education Interventions   Education Provided Provided Education  Mental Health Interventions   Mental Health Discussed/Reviewed Mental Health Discussed, Coping Strategies, Anxiety, Grief and Loss, Refer to Social Work for resources  Refer to Social Work for resources regarding Mental Health  [Recent death of spouse , moved out of her home]  Nutrition Interventions   Nutrition Discussed/Reviewed Nutrition Discussed, Nutrition Reviewed  Pharmacy Interventions   Pharmacy Dicussed/Reviewed Pharmacy Topics Reviewed, Pharmacy Topics Discussed, Medications and their functions, Medication Adherence, Referral to Pharmacist  Medication Adherence Not taking medication  [Pharmacist call completed 3/19 with interventions in place]  Referral to Pharmacist Drug interaction/side effects  Safety Interventions   Safety Discussed/Reviewed Safety Reviewed, Fall Risk, Home Safety  Home Safety Assistive Devices, Contact provider for referral to PT/OT  [[facility] 3/21]  Advanced Directive Interventions   Advanced Directives Discussed/Reviewed Advanced Directives Discussed         Benefits reviewed  Based on current information and Insurance plan -Reviewed benefits accessible to patient, including details about eligibility options for care and  available value based care options  if any areas of needs were identified.  Reviewed patient/  caregiver's ability to access and / or  ability  with navigating the benefits system..Amb Referral made if indicted , refer to orders section of note for details -Prior referral from previous TOC in progress   Reviewed goals for care Patient  and / or Caregiveverbalizes understanding of instructions and care plan provided. Patient / Caregiver was encouraged to make informed decisions about their care, actively participate in managing their health condition, and implement lifestyle changes as needed to promote independence and self-management of health care. There were no reported  barriers to care.   TOC program  Patient is at  risk for readmission and / or has history of  high utilization  Discussed VBCI  TOC program and weekly calls to patient to assess condition/status, medication management  and provide support/education as indicated . Patient  and / or Caregive voiced understanding and declined enrollment in the 30-day TOC Program at this time .  She recently competed the 30 day TOC program Prior Amb Referrals for SW and Pharmacy are in progress with calls/ visits scheduled .  She has a resumption of Care scheduled with [facility] 3/21 which includes an RN for Case Med and Disease Management .( Call placed and verified current referral with [facility] date) Will defer to Home Health to avoid duplication of services . Daughter feels with prior referrals and visits along with Home Health , she did not need additional calls at this time. She has moved  patient into her home where she will have 24/7 oversight. She mentioned the patient has been talking to her spouse and telling him she will see him soon. She also discussed her wishes including cremation  and where she wanted to be buried with her SIL    The Patient  and / or Caregiver  has been provided with contact information for the care management team and has been advised to call with any health-related questions or concerns. Patient was encouraged to Contact PCP with any questions or concerns  regarding ongoing medical care, any difficulty obtaining or picking up prescriptions, any changes or worsening in condition including signs / symptoms not relieved  with interventions Patient had no additional questions or concerns at this time.    Susa Loffler , BSN, RN [facility]   VBCI-Population Health RN Care Manager Direct Dial [phone]  Fax: [phone] Website: [email or web address]

## 2023-10-28 ENCOUNTER — Encounter: Payer: Self-pay | Admitting: Pediatrics

## 2023-10-28 ENCOUNTER — Ambulatory Visit: Admitting: Pediatrics

## 2023-10-28 ENCOUNTER — Ambulatory Visit: Payer: Self-pay | Admitting: *Deleted

## 2023-10-28 VITALS — BP 125/85 | HR 73 | Temp 97.6°F | Wt 107.0 lb

## 2023-10-28 DIAGNOSIS — I7121 Aneurysm of the ascending aorta, without rupture: Secondary | ICD-10-CM

## 2023-10-28 DIAGNOSIS — Z79899 Other long term (current) drug therapy: Secondary | ICD-10-CM

## 2023-10-28 DIAGNOSIS — G500 Trigeminal neuralgia: Secondary | ICD-10-CM

## 2023-10-28 DIAGNOSIS — R7989 Other specified abnormal findings of blood chemistry: Secondary | ICD-10-CM | POA: Diagnosis not present

## 2023-10-28 DIAGNOSIS — I214 Non-ST elevation (NSTEMI) myocardial infarction: Secondary | ICD-10-CM | POA: Diagnosis not present

## 2023-10-28 DIAGNOSIS — F419 Anxiety disorder, unspecified: Secondary | ICD-10-CM

## 2023-10-28 DIAGNOSIS — K582 Mixed irritable bowel syndrome: Secondary | ICD-10-CM

## 2023-10-28 DIAGNOSIS — R5381 Other malaise: Secondary | ICD-10-CM | POA: Diagnosis not present

## 2023-10-28 DIAGNOSIS — Z09 Encounter for follow-up examination after completed treatment for conditions other than malignant neoplasm: Secondary | ICD-10-CM | POA: Diagnosis not present

## 2023-10-28 DIAGNOSIS — F325 Major depressive disorder, single episode, in full remission: Secondary | ICD-10-CM

## 2023-10-28 NOTE — Progress Notes (Signed)
 Transitions of Care Note [facility] Follow Up   Brooke [name] is a 78 y.o. female presenting for Hospital Follow Up (Hospitalization Follow-up )  Summary of Hospitalization Dates of Hospitalization: 10/16/2023 until 10/19/2023 Chief reason for hospitalization: NSTEMI  Secondary problems:  Cystitis   Acute metabolic encephalopathy   Depression   COPD mixed type (HCC)   Hypotension   Constipation   Hyponatremia   Hiatal hernia   Essential hypertension   Rhinitis   Hypokalemia   Hypocalcemia   Iron deficiency   Pressure injury of skin   Elevated troponin   Ascending aortic aneurysm (HCC)   Demand ischemia (HCC)  Follow up recommendations: continue lasix, cardiology follow up  Called within 2 business days of discharge? yes, date provided: 10/21/2023  Brief summary : Admitted after recent UTI treatment and recent other admission. Had AMS found to have NSTEMI. For further details, please see D/C summary dated : 10/19/2023  Interval History Today, the patient reports she is feeling better Has stopped lasix because she does not like urinating a lot No chest pain, sob, palpations, orthopnea, dyspnea, PND, lower extremity edema.   Medications (current list documented below) 1. New medications prescribed at hospital discharge obtained by the patient? Yes   2. Patient taking the correct medications? Yes 3. Can patient/caregiver name the medications or carry an updated list? Yes 4. Medications reconciled at this visit? yes  Patient Empowerment and Education 1. Can patient/caregiver explain the reason for hospitalization and the nature of the care provided? yes 2. Can patient/caregiver explain how self-care/care in the home should be provided (BG checks, daily weights, diet restrictions, etc...)? yes 3. Does the patient/caregiver know what signs or symptoms indicate deterioration of their condition? Yes 4.  Does the patient/caregiver know what should be done if these  symptoms occur? yes   Additional educational needs were met today as follows: none  Home Status 1. Is the patient independent in performing ADL's? Somewhat, daughter helps a lot at baseline 2. Has the patient experienced a significant decline in physical functioning since hospitalization? no 3. Has the patient experienced a significant decline in cognitive functioning since hospitalization? no 4. Were Home Health services set up for the patient upon discharge? (Referrals are documented in the Case Manager [facility]) note {NOTES -> FILTERS -> AUTHOR TYPE: Case Manager.) yes    Is the patient receiving these services? yes 5. Is the patient receiving care management services? yes  Communication 1. I have communicated with other members of the patient's care team such as the PCP, inpatient team, specialty physicians, care management, home health agency, or allied health providers about the patient.   Social History Social History   Social History Narrative   Not on file     Substance Use History Tobacco :  reports that she has never smoked. She has never used smokeless tobacco.  Alcohol :  reports no history of alcohol use.  Recreational Drugs :  reports no history of drug use.   Review of Systems ROS  Current Medications: Current Outpatient Medications  Medication Sig Dispense Refill   losartan (COZAAR) 25 MG tablet Take 25 mg by mouth in the morning and at bedtime.     ondansetron (ZOFRAN) 4 MG tablet Take 1 tablet (4 mg total) by mouth every 8 (eight) hours as needed for nausea or vomiting. 20 tablet 0   aspirin EC (ASPIRIN 81) 81 MG tablet Take 1 tablet (81 mg total) by mouth daily. Swallow whole, 90 tablet  1   atorvastatin (LIPITOR) 40 MG tablet Take 1 tablet (40 mg total) by mouth daily. 90 tablet 1   clonazePAM (KLONOPIN) 0.5 MG tablet Take 1 tablet (0.5 mg total) by mouth 2 (two) times daily as needed for anxiety 90 tablet 1   clonazePAM (KLONOPIN) 1 MG tablet Take 1 mg by mouth  at bedtime.     FLUoxetine (PROZAC) 40 MG capsule Take 40 mg by mouth in the morning.     furosemide (LASIX) 20 MG tablet Take 20 mg by mouth as needed for fluid.     gabapentin (NEURONTIN) 100 MG capsule Take 1 capsule (100 mg total) by mouth in the morning and at bedtime. 90 capsule 3   ipratropium (ATROVENT) 0.06 % nasal spray Use 1 - 2 sprays into each nostril twice daily as needed for runny nose 15 mL 11   montelukast (SINGULAIR) 10 MG tablet Take 1 tablet (10 mg total) by mouth daily for allergies and wheezing 90 tablet 3   omeprazole (PRILOSEC) 20 MG capsule Take 1 capsule (20 mg total) by mouth daily. 90 capsule 3   omeprazole (PRILOSEC) 20 MG capsule Take 1 capsule (20 mg total) by mouth 2 (two) times daily before a meal 180 capsule 0   No current facility-administered medications for this visit.    Problem List: Patient Active Problem List   Diagnosis Date Noted   Ascending aortic aneurysm (HCC) 10/19/2023   Non-ST elevation (NSTEMI) myocardial infarction (HCC) 10/17/2023   Hiatal hernia 10/16/2023   Iron deficiency 10/16/2023   Polypharmacy 05/05/2022   Irritable bowel syndrome with both constipation and diarrhea 11/12/2020   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   Sensorineural hearing loss (SNHL) of right ear with unrestricted hearing of left ear 02/15/2017   Trigeminal neuralgia 10/28/2015   Anxiety 12/28/2012   Major depressive disorder with single episode, in full remission (HCC) 12/28/2012   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/14/2010    Objective:   Today's Vitals   10/28/23 1142  BP: 125/85  Pulse: 73  Temp: 97.6 F (36.4 C)  TempSrc: Oral  SpO2: 96%  Weight: 107 lb (48.5 kg)  PainSc: 9   PainLoc: Generalized   Body mass index is 20.9 kg/m.  Physical Exam Constitutional:      Appearance: Normal appearance.  HENT:     Head: Normocephalic and atraumatic.  Eyes:     Pupils: Pupils are equal, round, and reactive to light.  Cardiovascular:      Rate and Rhythm: Normal rate and regular rhythm.     Pulses: Normal pulses.     Heart sounds: Murmur heard.     Comments: Murmur at baseline Pulmonary:     Effort: Pulmonary effort is normal.     Breath sounds: Normal breath sounds.  Abdominal:     General: Abdomen is flat.     Palpations: Abdomen is soft.  Musculoskeletal:        General: Normal range of motion.     Cervical back: Normal range of motion.  Skin:    General: Skin is warm and dry.     Capillary Refill: Capillary refill takes less than 2 seconds.  Neurological:     General: No focal deficit present.     Mental Status: She is alert. Mental status is at baseline.  Psychiatric:        Mood and Affect: Mood normal.        Behavior: Behavior normal.  Assessment and Plan:   Assessment & Plan   Hospitalization reviewed, medications reviewed. Plan and follow up for acute concerns below.  Hospital discharge follow-up  Polypharmacy Assessment & Plan: Reviewed recent medication changes. Has upcoming appointment with clinical pharmacist to start home delivery/pill pack.   Non-ST elevation (NSTEMI) myocardial infarction (HCC) Elevated brain natriuretic peptide (BNP) level Assessment & Plan: No recurrence of symptoms. Has upcoming cardiology following. Ok to dc lasix and leave as prn (gave weight and symptom parameters). TTE preserved EF, no wall motion abnormalities which is reassuring. Coronary CT ordered showing mild/mod cad. Continue asa/statin and lasix per cardiology.  Orders: -     CBC with Differential/Platelet -     Basic metabolic panel with GFR -     Iron, TIBC and Ferritin Panel -     Brain natriuretic peptide  Trigeminal neuralgia Assessment & Plan: Previously managed w fiorinal. Discontinued since admission given new daily ASA. Managing with prn tylenol. CTM.   Major depressive disorder with single episode, in full remission [facility]) Anxiety Assessment & Plan: Worsened in setting of husband's  passing. She continues to manage with prozac and klonopin BID (sometimes takes 1 pills sometimes half). CTM.  Aneurysm of ascending aorta without rupture [facility]) Assessment & Plan: 4cm, continue surveillance.    Irritable bowel syndrome with both constipation and diarrhea Assessment & Plan: Intermittent. Encouraged GI follow up as previously discussed as she is overdue for colonoscopy.  Physical deconditioning Doing home PT/OT. Encouraged protein supplementation. Close follow up.  Diagnostic Tests, Consultations, and Other Outstanding Issues I have reviewed the following data from this admission:    Discharge summary  : Yes  Discharge/admit labs : Yes  Imaging studies : Yes  Procedures/operative reports : Yes  This plan was discussed with the patient and questions were answered. There were no further concerns. There were no barriers to care. Follow up as indicated, or sooner should any new problems arise, if conditions worsen, or if they are otherwise concerned.   Return in about 2 weeks (around 11/11/2023) for Chronic illness f/u, (virtual).  Future Appointments  Date Time Provider Department Center  11/11/2023 11:20 AM Jackolyn Confer, MD [facility]  01/26/2024  4:20 PM End, Cristal Deer, MD [facility] None    Patient Instructions  Pick up some voltaren gel  You can stop the lasix daily. This can move to as needed.  I recommend weighing yourself every day. Your dry weight is 107lbs. If you gain more than 2lbs overnight, please take one lasix/furosemide pill. If you gain more than 5lbs in 1 week, call us or your cardiologist.  CMS Transitions code 16109 CMS Transitions code 60454  moderate complexity MDM (equivalent to EL4) face-to-face visit within 14 days.  Called within 2 business days of discharge high complexity MDM (equivalent to [facility]) face-to-face visit within 7 days.  Called within 2 business days of discharge

## 2023-10-28 NOTE — Patient Instructions (Addendum)
 Pick up some voltaren gel  You can stop the lasix daily. This can move to as needed.  I recommend weighing yourself every day. Your dry weight is 107lbs. If you gain more than 2lbs overnight, please take one lasix/furosemide pill. If you gain more than 5lbs in 1 week, call us or your cardiologist.

## 2023-10-29 ENCOUNTER — Telehealth: Payer: Self-pay

## 2023-10-29 LAB — CBC WITH DIFFERENTIAL/PLATELET
Basophils Absolute: 0.1 10*3/uL (ref 0.0–0.2)
Basos: 1 %
EOS (ABSOLUTE): 0.1 10*3/uL (ref 0.0–0.4)
Eos: 1 %
Hematocrit: 36.8 % (ref 34.0–46.6)
Hemoglobin: 11.8 g/dL (ref 11.1–15.9)
Immature Grans (Abs): 0 10*3/uL (ref 0.0–0.1)
Immature Granulocytes: 0 %
Lymphocytes Absolute: 1.2 10*3/uL (ref 0.7–3.1)
Lymphs: 15 %
MCH: 29 pg (ref 26.6–33.0)
MCHC: 32.1 g/dL (ref 31.5–35.7)
MCV: 90 fL (ref 79–97)
Monocytes Absolute: 0.6 10*3/uL (ref 0.1–0.9)
Monocytes: 7 %
Neutrophils Absolute: 6.1 10*3/uL (ref 1.4–7.0)
Neutrophils: 76 %
Platelets: 431 10*3/uL (ref 150–450)
RBC: 4.07 x10E6/uL (ref 3.77–5.28)
RDW: 13 % (ref 11.7–15.4)
WBC: 8 10*3/uL (ref 3.4–10.8)

## 2023-10-29 LAB — BASIC METABOLIC PANEL WITH GFR
BUN/Creatinine Ratio: 17 (ref 12–28)
BUN: 13 mg/dL (ref 8–27)
CO2: 25 mmol/L (ref 20–29)
Calcium: 9.2 mg/dL (ref 8.7–10.3)
Chloride: 92 mmol/L — ABNORMAL LOW (ref 96–106)
Creatinine, Ser: 0.77 mg/dL (ref 0.57–1.00)
Glucose: 102 mg/dL — ABNORMAL HIGH (ref 70–99)
Potassium: 4.2 mmol/L (ref 3.5–5.2)
Sodium: 132 mmol/L — ABNORMAL LOW (ref 134–144)
eGFR: 79 mL/min/{1.73_m2} (ref >59)

## 2023-10-29 LAB — IRON,TIBC AND FERRITIN PANEL
Ferritin: 120 ng/mL (ref 15–150)
Iron Saturation: 19 % (ref 15–55)
Iron: 52 ug/dL (ref 27–139)
Total Iron Binding Capacity: 278 ug/dL (ref 250–450)
UIBC: 226 ug/dL (ref 118–369)

## 2023-10-29 LAB — BRAIN NATRIURETIC PEPTIDE: BNP: 30.4 pg/mL (ref 0.0–100.0)

## 2023-10-29 NOTE — Patient Outreach (Signed)
 Care Coordination   Follow Up Visit Note   10/29/2023 Name: Brooke [name] MRN: [id number] DOB: 09-25-1945  Brooke [name] is a 78 y.o. year old female who sees Evelene Croon, Atilano Median, MD for primary care. I spoke with  Brooke [name] 's daughter by phone on 10/28/23.  What matters to the patients health and wellness today?  Patient currently resides daughter who is patient's  primary caregiver . Patent's daughter denies having any additional community resource needs at this time.    Goals Addressed             This Visit's Progress    care coordination activities         EMOTIONAL / MENTAL HEALTH SUPPORT Keep all upcoming appointment discussed today Continue with compliance of taking medication prescribed by Doctor Daughter to continue to fill pill box weekly Self Support options  (Continue to work with clergy ongoing grief support ) Contact your provider with any new concerns or questions regarding patient's medical care         SDOH assessments and interventions completed:  No     Care Coordination Interventions:  Yes, provided  Interventions Today    Flowsheet Row Most Recent Value  Chronic Disease   Chronic disease during today's visit Other  [Polypharmacy -Anxiety-Major depressive disorder with single episode, in full remission Generalized weakness]  General Interventions   General Interventions Discussed/Reviewed General Interventions Reviewed, [employer] assessed for community resource/mental health needs following the death of her spouse.]  Doctor Visits Discussed/Reviewed Doctor Visits Reviewed, PCP  [10/28/23 PCP]  Level of Care Personal Care Services  [confirmed that patient has moved in with daughter-patient's daughter is patient's primary caregiver]  Exercise Interventions   Physical Activity Discussed/Reviewed Physical Activity Discussed  [Patient active with [facility]]  Mental Health Interventions   Mental Health Discussed/Reviewed Mental  Health Discussed, Grief and Loss, Coping Strategies  [[name] confirms that patient declined referral for grief counseling  at this time, Patient's preacher checking in regularly. No additional mental health needs at this time]  Pharmacy Interventions   Pharmacy Dicussed/Reviewed Pharmacy Topics Discussed  [daughter fmanages patient's medications-fills pill box 1 week at a time, compliance has improved]       Follow up plan: No further intervention required.   Encounter Outcome:  Patient Visit Completed

## 2023-10-29 NOTE — Patient Instructions (Signed)
 Visit Information  Thank you for taking time to visit with me today. Please don't hesitate to contact me if I can be of assistance to you.   Following are the goals we discussed today:   Goals Addressed             This Visit's Progress    COMPLETED: care coordination activities         EMOTIONAL / MENTAL HEALTH SUPPORT Keep all upcoming appointment discussed today Continue with compliance of taking medication prescribed by Doctor Daughter to continue to fill pill box weekly Self Support options  (Continue to work with clergy ongoing grief support ) Contact your provider with any new concerns or questions regarding patient's medical care          If you are experiencing a Mental Health or Behavioral Health Crisis or need someone to talk to, please call 911   Patient verbalizes understanding of instructions and care plan provided today and agrees to view in MyChart. Active MyChart status and patient understanding of how to access instructions and care plan via MyChart confirmed with patient.     No further follow up required: patient to contact this Child psychotherapist with any additional community resource needs.  Emmer Lillibridge, LCSW [place]  [facility], [facility] Licensed Clinical Social Worker Care Coordinator  Direct Dial: [phone]

## 2023-11-01 NOTE — Progress Notes (Signed)
   11/01/2023  Patient ID: Brooke [name], female   DOB: 06-21-1946, 78 y.o.   MRN: [id number]  Patient had a follow-up with PCP last week, and furosemide is now PRN; and trazodone was stopped.  Dr. Evelene Croon also states patient is not taking fiorinal, because she is taking ASA 81mg  daily.  She is also using clonazepam 0.5mg  BID.  Message sent to daughter to schedule telephone call to review medications, so I can then collaborate with [facility] for pill packaging and home delivery.  [name], PharmD, DPLA

## 2023-11-03 ENCOUNTER — Other Ambulatory Visit

## 2023-11-03 DIAGNOSIS — F325 Major depressive disorder, single episode, in full remission: Secondary | ICD-10-CM

## 2023-11-03 DIAGNOSIS — F419 Anxiety disorder, unspecified: Secondary | ICD-10-CM

## 2023-11-03 NOTE — Progress Notes (Signed)
   11/03/2023  Patient ID: Brooke [name], female   DOB: 08/15/1945, 78 y.o.   MRN: [id number]  Telephone follow-up with patient's daughter, [name] [facility]), to review medication list after recent PCP visit where some changes to pharmacotherapy were made.  Verified current medication list:  ASA 81mg  takes AM- [facility]  Atorvastatin 40mg  takes AM- [facility] Clonazepam 0.5mg  BID- needs [facility] Clonazepam 1mg  HS- we will send new rx to [facility] Fluoxetine 40mg  AM- we will send new rx to [facility] Furosemide 20mg  as needed -we will send new rx Gabapentin 100mg  in the morning and at bedtime- we will send new rx Ipratropium NS PRN- [facility] and [facility] on hold until pt requests Losartan 25mg  morning and bedtime- [facility] Montelukast 10mg  HS- [facility] Omeprazole 20mg  before breakfast and supper- we will send new rx  Coordinating with [facility] pharmacy to [facility] that have active refills, and working with PCP to send new prescriptions for those without refills and those that directions have recently changed.  Sending daughter MyChart message with [name] phone number as well as mine if she has any issues or future medication needs.  [name], PharmD, DPLA

## 2023-11-05 ENCOUNTER — Other Ambulatory Visit: Payer: Self-pay

## 2023-11-05 ENCOUNTER — Other Ambulatory Visit (HOSPITAL_COMMUNITY): Payer: Self-pay

## 2023-11-06 ENCOUNTER — Other Ambulatory Visit (HOSPITAL_COMMUNITY): Payer: Self-pay

## 2023-11-08 ENCOUNTER — Other Ambulatory Visit (HOSPITAL_COMMUNITY): Payer: Self-pay

## 2023-11-08 ENCOUNTER — Encounter: Payer: Self-pay | Admitting: Pediatrics

## 2023-11-08 ENCOUNTER — Other Ambulatory Visit: Payer: Self-pay

## 2023-11-08 ENCOUNTER — Telehealth: Payer: Self-pay

## 2023-11-08 ENCOUNTER — Ambulatory Visit: Admitting: Medical

## 2023-11-08 MED ORDER — MONTELUKAST SODIUM 10 MG PO TABS
10.0000 mg | ORAL_TABLET | Freq: Every day | ORAL | 3 refills | Status: DC
  Filled 2023-11-08 (×3): qty 90, 90d supply, fill #0
  Filled 2023-11-09: qty 30, 30d supply, fill #0
  Filled 2023-11-23 – 2023-12-02 (×2): qty 30, 30d supply, fill #1
  Filled 2023-12-21 – 2023-12-28 (×2): qty 30, 30d supply, fill #2
  Filled 2024-02-01: qty 30, 30d supply, fill #3
  Filled 2024-03-02: qty 30, 30d supply, fill #4
  Filled 2024-03-29 – 2024-04-04 (×2): qty 30, 30d supply, fill #5
  Filled 2024-05-04 – 2024-05-09 (×5): qty 30, 30d supply, fill #6
  Filled 2024-06-01: qty 30, 30d supply, fill #7
  Filled 2024-06-26: qty 30, 30d supply, fill #8
  Filled ????-??-??: fill #0

## 2023-11-08 MED ORDER — ASPIRIN 81 MG PO TBEC
81.0000 mg | DELAYED_RELEASE_TABLET | Freq: Every day | ORAL | 1 refills | Status: DC
  Filled 2023-11-08 (×2): qty 90, 90d supply, fill #0
  Filled 2023-11-09: qty 30, 30d supply, fill #0
  Filled 2023-11-23 – 2023-12-02 (×2): qty 30, 30d supply, fill #1
  Filled 2023-12-21 – 2023-12-28 (×2): qty 30, 30d supply, fill #2
  Filled ????-??-??: fill #0

## 2023-11-08 MED ORDER — TRAZODONE HCL 100 MG PO TABS
100.0000 mg | ORAL_TABLET | Freq: Every day | ORAL | 1 refills | Status: DC
  Filled 2023-11-08 (×2): qty 30, 30d supply, fill #0

## 2023-11-08 MED ORDER — LOSARTAN POTASSIUM 25 MG PO TABS
50.0000 mg | ORAL_TABLET | Freq: Two times a day (BID) | ORAL | 3 refills | Status: DC
  Filled 2023-11-08 (×3): qty 180, 45d supply, fill #0
  Filled ????-??-??: fill #0

## 2023-11-08 MED ORDER — BUSPIRONE HCL 10 MG PO TABS
10.0000 mg | ORAL_TABLET | Freq: Two times a day (BID) | ORAL | 3 refills | Status: DC
  Filled 2023-11-08: qty 60, 30d supply, fill #0

## 2023-11-08 MED ORDER — BUTALBITAL-ASPIRIN-CAFFEINE 50-325-40 MG PO CAPS: 1.0000 | ORAL_CAPSULE | Freq: Two times a day (BID) | ORAL | 1 refills | Status: DC

## 2023-11-08 MED ORDER — FLUOXETINE HCL 40 MG PO CAPS
40.0000 mg | ORAL_CAPSULE | Freq: Every day | ORAL | 3 refills | Status: DC
  Filled 2023-11-08 (×3): qty 90, 90d supply, fill #0
  Filled ????-??-??: fill #0

## 2023-11-08 MED ORDER — FUROSEMIDE 20 MG PO TABS
20.0000 mg | ORAL_TABLET | Freq: Every day | ORAL | 1 refills | Status: DC
  Filled 2023-11-08: qty 90, 90d supply, fill #0

## 2023-11-08 MED ORDER — OMEPRAZOLE 20 MG PO CPDR
20.0000 mg | DELAYED_RELEASE_CAPSULE | Freq: Two times a day (BID) | ORAL | 0 refills | Status: DC
  Filled 2023-11-08: qty 180, 90d supply, fill #0
  Filled 2023-11-09: qty 60, 30d supply, fill #0
  Filled 2023-11-23 – 2023-12-02 (×2): qty 60, 30d supply, fill #1
  Filled 2023-12-21 – 2023-12-28 (×2): qty 60, 30d supply, fill #2

## 2023-11-08 MED ORDER — CLONAZEPAM 0.5 MG PO TABS
0.5000 mg | ORAL_TABLET | Freq: Two times a day (BID) | ORAL | 1 refills | Status: DC
  Filled 2023-11-08: qty 90, 45d supply, fill #0

## 2023-11-08 MED ORDER — ATORVASTATIN CALCIUM 40 MG PO TABS
40.0000 mg | ORAL_TABLET | Freq: Every day | ORAL | 1 refills | Status: DC
  Filled 2023-11-08 (×3): qty 90, 90d supply, fill #0
  Filled 2023-11-09: qty 30, 30d supply, fill #0
  Filled 2023-11-10 – 2023-12-21 (×3): qty 90, 90d supply, fill #0
  Filled 2023-12-28: qty 30, 30d supply, fill #0
  Filled 2024-02-01: qty 30, 30d supply, fill #1
  Filled 2024-03-02: qty 30, 30d supply, fill #2
  Filled ????-??-??: fill #0

## 2023-11-08 MED ORDER — GABAPENTIN 100 MG PO CAPS
100.0000 mg | ORAL_CAPSULE | Freq: Two times a day (BID) | ORAL | 3 refills | Status: DC
  Filled 2023-11-08 – 2024-05-04 (×4): qty 90, 45d supply, fill #0
  Filled ????-??-??: fill #0

## 2023-11-08 MED ORDER — GABAPENTIN 300 MG PO CAPS
300.0000 mg | ORAL_CAPSULE | Freq: Two times a day (BID) | ORAL | 3 refills | Status: DC
Start: 2023-01-08 — End: 2023-11-08
  Filled 2023-11-08 (×2): qty 180, 90d supply, fill #0
  Filled ????-??-??: fill #0

## 2023-11-08 MED ORDER — OMEPRAZOLE 20 MG PO CPDR
20.0000 mg | DELAYED_RELEASE_CAPSULE | Freq: Every day | ORAL | 3 refills | Status: DC
  Filled 2023-11-08 (×3): qty 90, 90d supply, fill #0
  Filled ????-??-??: fill #0

## 2023-11-08 MED ORDER — IPRATROPIUM BROMIDE 0.06 % NA SOLN
1.0000 | Freq: Two times a day (BID) | NASAL | 11 refills | Status: DC | PRN
  Filled 2023-11-08: qty 15, 30d supply, fill #0
  Filled 2024-02-11: qty 15, 21d supply, fill #0

## 2023-11-08 MED ORDER — DIPHENOXYLATE-ATROPINE 2.5-0.025 MG PO TABS: ORAL_TABLET | ORAL | 3 refills | Status: DC

## 2023-11-08 MED ORDER — FLUOXETINE HCL 20 MG PO CAPS
20.0000 mg | ORAL_CAPSULE | Freq: Every day | ORAL | 3 refills | Status: DC
  Filled 2023-11-08 (×3): qty 90, 90d supply, fill #0
  Filled ????-??-??: fill #0

## 2023-11-08 NOTE — Assessment & Plan Note (Signed)
 Reviewed recent medication changes. Has upcoming appointment with clinical pharmacist to start home delivery/pill pack.

## 2023-11-08 NOTE — Assessment & Plan Note (Signed)
 Worsened in setting of husband's passing. She continues to manage with prozac and klonopin BID (sometimes takes 1 pills sometimes half). CTM.

## 2023-11-08 NOTE — Assessment & Plan Note (Signed)
 Intermittent. Encouraged GI follow up as previously discussed as she is overdue for colonoscopy.

## 2023-11-08 NOTE — Assessment & Plan Note (Signed)
 No recurrence of symptoms. Has upcoming cardiology following. Ok to dc lasix and leave as prn (gave weight and symptom parameters). TTE preserved EF, no wall motion abnormalities which is reassuring. Coronary CT ordered showing mild/mod cad. Continue asa/statin and lasix per cardiology.

## 2023-11-08 NOTE — Assessment & Plan Note (Signed)
 Previously managed w fiorinal. Discontinued since admission given new daily ASA. Managing with prn tylenol. CTM.

## 2023-11-08 NOTE — Assessment & Plan Note (Signed)
 4cm, continue surveillance.

## 2023-11-08 NOTE — Progress Notes (Signed)
   11/08/2023  Patient ID: Brooke [name], female   DOB: 04-17-1946, 78 y.o.   MRN: [id number]  Incoming call from patient's daughter, [name] [facility]), stating after tonight patient will be out of both clonazepam 0.5mg  and 1mg .  Contacted [facility], and order for 0.5mg  is in process that can be delivered tomorrow.  Pharmacy is still awaiting orders with updated directions for fluoxetine, furosemide, and losartan- as well as a refill for clonazepam 1mg .  I have pended these orders for Dr. Evelene Croon to sign if in agreement.  Pharmacy will be able to fill, package, and deliver medications (except for atorvastatin-90 day recently filled) once all orders are received.  Informed daughter that clonazepam 0.5mg  will be delivered tomorrow.  [name], PharmD, DPLA

## 2023-11-09 ENCOUNTER — Other Ambulatory Visit (HOSPITAL_COMMUNITY): Payer: Self-pay

## 2023-11-09 ENCOUNTER — Other Ambulatory Visit: Payer: Self-pay

## 2023-11-09 MED ORDER — LOSARTAN POTASSIUM 25 MG PO TABS
25.0000 mg | ORAL_TABLET | Freq: Two times a day (BID) | ORAL | 2 refills | Status: DC
  Filled 2023-11-09: qty 60, 30d supply, fill #0
  Filled 2023-11-23 – 2023-12-02 (×2): qty 60, 30d supply, fill #1
  Filled 2023-12-21 – 2023-12-28 (×2): qty 60, 30d supply, fill #2
  Filled 2024-02-01: qty 60, 30d supply, fill #3
  Filled 2024-03-02: qty 60, 30d supply, fill #4

## 2023-11-09 MED ORDER — GABAPENTIN 100 MG PO CAPS
100.0000 mg | ORAL_CAPSULE | Freq: Two times a day (BID) | ORAL | 3 refills | Status: DC
  Filled 2023-11-09: qty 60, 30d supply, fill #0
  Filled 2023-11-23 – 2023-12-02 (×2): qty 60, 30d supply, fill #1
  Filled 2023-12-21 – 2023-12-28 (×2): qty 60, 30d supply, fill #2
  Filled 2024-02-01: qty 60, 30d supply, fill #3
  Filled 2024-03-02: qty 60, 30d supply, fill #4
  Filled 2024-03-29 – 2024-04-04 (×2): qty 60, 30d supply, fill #5

## 2023-11-09 MED ORDER — FUROSEMIDE 20 MG PO TABS
ORAL_TABLET | ORAL | 1 refills | Status: DC
  Filled 2023-11-09: qty 30, 30d supply, fill #0
  Filled 2023-11-09: qty 90, 90d supply, fill #0

## 2023-11-09 MED ORDER — FLUOXETINE HCL 40 MG PO CAPS
40.0000 mg | ORAL_CAPSULE | Freq: Every morning | ORAL | 3 refills | Status: DC
  Filled 2023-11-09: qty 30, 30d supply, fill #0
  Filled 2023-11-23 – 2023-12-02 (×2): qty 30, 30d supply, fill #1
  Filled 2023-12-21 – 2023-12-28 (×2): qty 30, 30d supply, fill #2
  Filled 2024-02-01: qty 30, 30d supply, fill #3
  Filled 2024-03-02: qty 30, 30d supply, fill #4
  Filled 2024-03-29 – 2024-04-04 (×2): qty 30, 30d supply, fill #5
  Filled 2024-05-04 – 2024-05-09 (×5): qty 30, 30d supply, fill #6
  Filled 2024-06-01: qty 30, 30d supply, fill #7
  Filled 2024-06-26: qty 30, 30d supply, fill #8
  Filled 2024-08-01: qty 30, 30d supply, fill #9

## 2023-11-09 MED ORDER — CLONAZEPAM 1 MG PO TABS
1.0000 mg | ORAL_TABLET | Freq: Every day | ORAL | 0 refills | Status: DC
  Filled 2023-11-09: qty 30, 30d supply, fill #0

## 2023-11-09 NOTE — Progress Notes (Signed)
   11/09/2023  Patient ID: Brooke [name], female   DOB: Dec 17, 1945, 78 y.o.   MRN: [id number]  In basket message from [facility] stating pharmacy has now received all active medications for patient and verifying medications to be included in adherence packaging:  ASA, atorvastatin, fluoxetine, gabapentin, losartan, omeprazole, montelukast.  Furosemide, both clonazepam doses, ipratoprium nasal spray, and ondansetron will be sent in bottles when requested since they are PRN medications and/or controlled substances.  [name], PharmD, DPLA

## 2023-11-09 NOTE — Addendum Note (Signed)
 Addended by: Sabino Niemann A on: 11/09/2023 12:06 PM   Modules accepted: Orders

## 2023-11-10 ENCOUNTER — Other Ambulatory Visit: Payer: Self-pay

## 2023-11-10 ENCOUNTER — Other Ambulatory Visit (HOSPITAL_COMMUNITY): Payer: Self-pay

## 2023-11-11 ENCOUNTER — Encounter: Payer: Self-pay | Admitting: Pediatrics

## 2023-11-11 ENCOUNTER — Telehealth (INDEPENDENT_AMBULATORY_CARE_PROVIDER_SITE_OTHER): Admitting: Pediatrics

## 2023-11-11 VITALS — BP 134/82 | HR 96 | Wt 112.0 lb

## 2023-11-11 DIAGNOSIS — Z79899 Other long term (current) drug therapy: Secondary | ICD-10-CM

## 2023-11-11 DIAGNOSIS — R5381 Other malaise: Secondary | ICD-10-CM

## 2023-11-11 DIAGNOSIS — F325 Major depressive disorder, single episode, in full remission: Secondary | ICD-10-CM

## 2023-11-11 DIAGNOSIS — J449 Chronic obstructive pulmonary disease, unspecified: Secondary | ICD-10-CM | POA: Diagnosis not present

## 2023-11-11 DIAGNOSIS — E871 Hypo-osmolality and hyponatremia: Secondary | ICD-10-CM | POA: Diagnosis not present

## 2023-11-11 DIAGNOSIS — U071 COVID-19: Secondary | ICD-10-CM | POA: Diagnosis not present

## 2023-11-11 DIAGNOSIS — G500 Trigeminal neuralgia: Secondary | ICD-10-CM | POA: Diagnosis not present

## 2023-11-11 DIAGNOSIS — F419 Anxiety disorder, unspecified: Secondary | ICD-10-CM

## 2023-11-11 NOTE — Progress Notes (Signed)
 =  Telehealth Visit  I connected with  Brooke [name] on 11/11/23 by a video enabled telemedicine application and verified that I am speaking with the correct person using two identifiers.   I discussed the limitations of evaluation and management by telemedicine. The patient expressed understanding and agreed to proceed.  Subjective:    Patient ID: Brooke [name], female    DOB: 04-Dec-1945, 78 y.o.   MRN: [id number]  HPI: Brooke [name] is a 78 y.o. female  Chief Complaint  Patient presents with   Depression    Pt daughter states pt seems to be doing better     Discussed the use of AI scribe software for clinical note transcription with the patient, who gave verbal consent to proceed.  History of Present Illness   Brooke [name] is a 78 year old female who presents for a follow-up regarding her mood and medication management.  Her mood has shown improvement recently, with no significant mood swings or depressive episodes. She is currently stable on her medication regimen, which includes Klonopin (clonazepam) 0.5 mg taken twice daily, once in the morning and once at night. There have been no issues with her current regimen, and she does not require a higher dose at this time.  Her eating habits are good, and she is eating well, which is a positive sign of her improved mood and general well-being.  She has been spending time outside when the weather is warm, but the recent cold snap and pollen have limited her outdoor activities.      Relevant past medical, surgical, family and social history reviewed and updated as indicated. Interim medical history since our last visit reviewed. Allergies and medications reviewed and updated.  ROS per HPI unless specifically indicated above     Objective:    BP 134/82 Comment: Patient obtained  Pulse 96   Wt 112 lb (50.8 kg)   SpO2 96%   BMI 21.87 kg/m   Wt Readings from Last 3 Encounters:  11/11/23 112 lb (50.8 kg)  10/28/23 107 lb  (48.5 kg)  10/16/23 111 lb 1.8 oz (50.4 kg)     Physical Exam Constitutional:      General: She is not in acute distress.    Appearance: Normal appearance.  Neurological:     General: No focal deficit present.     Mental Status: She is alert. Mental status is at baseline.      LIMITED EXAM GIVEN VIDEO VISIT     Assessment & Plan:  Assessment & Plan   Major depressive disorder with single episode, in full remission [facility]) Physical deconditioning Assessment & Plan: Coping appropriately to husband's recent passing. Has not required higher dose of klnopin. Take 0.5mg  BID. Continue close follow up, encouraged ongoing good nutrition and spending time outside as able. No acute safety concerns.   Polypharmacy Assessment & Plan: Clinical pharmacy recently set up her pill packs. Confirmed current list. CTM.   Follow up plan: Return in about 4 weeks (around 12/09/2023) for (virtual), Mood.  Jackolyn Confer, MD   This visit was completed via video visit through MyChart due to the restrictions of the COVID-19 pandemic. All issues as above were discussed and addressed. Physical exam was done as above through visual confirmation on video through MyChart. If it was felt that the patient should be evaluated in the office, they were directed there. The patient verbally consented to this visit. Location of the patient: home Location of the provider: work  Those involved with this call:  Provider: Modena Nunnery, MD CMA:  Maggie Font Time spent on call:  10 minutes with patient face to face via video conference. More than 50% of this time was spent in counseling and coordination of care. 10 minutes total spent in review of patient's record and preparation of their chart. Total time spent on this encounter: 20 minutes.

## 2023-11-11 NOTE — Assessment & Plan Note (Signed)
 Coping appropriately to husband's recent passing. Has not required higher dose of klnopin. Take 0.5mg  BID. Continue close follow up, encouraged ongoing good nutrition and spending time outside as able. No acute safety concerns.

## 2023-11-11 NOTE — Patient Instructions (Signed)
 Continue current regimen We will plan on seeing each other in 1 month. Please reach out if any issues come up before then.

## 2023-11-11 NOTE — Assessment & Plan Note (Signed)
 Clinical pharmacy recently set up her pill packs. Confirmed current list. CTM.

## 2023-11-12 ENCOUNTER — Other Ambulatory Visit: Payer: Self-pay

## 2023-11-23 ENCOUNTER — Other Ambulatory Visit: Payer: Self-pay

## 2023-11-25 ENCOUNTER — Ambulatory Visit: Admitting: Medical

## 2023-12-02 ENCOUNTER — Other Ambulatory Visit: Payer: Self-pay

## 2023-12-06 ENCOUNTER — Other Ambulatory Visit: Payer: Self-pay

## 2023-12-08 ENCOUNTER — Telehealth (INDEPENDENT_AMBULATORY_CARE_PROVIDER_SITE_OTHER): Admitting: Pediatrics

## 2023-12-08 DIAGNOSIS — F419 Anxiety disorder, unspecified: Secondary | ICD-10-CM

## 2023-12-08 DIAGNOSIS — F325 Major depressive disorder, single episode, in full remission: Secondary | ICD-10-CM | POA: Diagnosis not present

## 2023-12-08 NOTE — Patient Instructions (Signed)
 Continue current regimen Follow up in 3 months.  Please reach out if you need me earlier.

## 2023-12-10 ENCOUNTER — Encounter: Payer: Self-pay | Admitting: Pediatrics

## 2023-12-10 ENCOUNTER — Other Ambulatory Visit (HOSPITAL_COMMUNITY): Payer: Self-pay

## 2023-12-10 ENCOUNTER — Other Ambulatory Visit: Payer: Self-pay

## 2023-12-10 MED ORDER — CLONAZEPAM 0.5 MG PO TABS
0.5000 mg | ORAL_TABLET | Freq: Two times a day (BID) | ORAL | 1 refills | Status: DC
  Filled 2023-12-10 – 2023-12-21 (×3): qty 90, 45d supply, fill #0
  Filled 2024-02-03 (×2): qty 90, 45d supply, fill #1

## 2023-12-10 NOTE — Progress Notes (Signed)
 Telehealth Visit  I connected with  [name] on 12/10/23 by a video enabled telemedicine application and verified that I am speaking with the correct person using two identifiers.   I discussed the limitations of evaluation and management by telemedicine. The patient expressed understanding and agreed to proceed.  Subjective:    Patient ID: Brooke [name], female    DOB: Dec 08, 1945, 78 y.o.   MRN: [id number]  HPI: Brooke [name] is a 78 y.o. female  Chief Complaint  Patient presents with   Depression    Pt daughter states things seem to be going fine for pt with the current medication regimen     Discussed the use of AI scribe software for clinical note transcription with the patient, who gave verbal consent to proceed.  History of Present Illness   Brooke [name] is a 78 year old female who presents for medication management and follow-up after the recent loss of her husband.  She is adjusting well following the loss of her husband a little over a month ago. She feels 'pretty good' and is eating well, with stable weight. No significant changes in mood or daily functioning have been noted.  She is currently taking Klonopin , 0.5 mg in the morning and at lunchtime, and 1 mg at night to aid with sleep. She has enough medication for about another week and will need refills. Her medications are delivered to her.  She prefers video consultations and has scheduled her next follow-up for August 6th around 11:20 AM.      Relevant past medical, surgical, family and social history reviewed and updated as indicated. Interim medical history since our last visit reviewed. Allergies and medications reviewed and updated.  ROS per HPI unless specifically indicated above     Objective:     There were no vitals taken for this visit.  Wt Readings from Last 3 Encounters:  11/11/23 112 lb (50.8 kg)  10/28/23 107 lb (48.5 kg)  10/16/23 111 lb 1.8 oz (50.4 kg)     Physical  Exam Constitutional:      General: She is not in acute distress.    Appearance: Normal appearance.  Neurological:     General: No focal deficit present.     Mental Status: She is alert. Mental status is at baseline.      LIMITED EXAM GIVEN VIDEO VISIT     Assessment & Plan:  Assessment & Plan   Major depressive disorder with single episode, in full remission (HCC) Anxiety Anxiety well-managed with current medication. Adjusting well after husband's loss. - Continue Klonopin  0.5 mg twice daily and 1 mg at night. - Send prescription refills for Klonopin  for three months with automatic refill. - Schedule follow-up video consultation for August 6th at 11:20 AM.  -     clonazePAM ; Take 1 tablet (0.5 mg total) by mouth 2 (two) times daily.  Dispense: 90 tablet; Refill: 1   Follow up plan: Return in about 3 months (around 03/09/2024) for Mood.  Hadassah Letters, MD   This visit was completed via video visit through MyChart due to the restrictions of the COVID-19 pandemic. All issues as above were discussed and addressed. Physical exam was done as above through visual confirmation on video through MyChart. If it was felt that the patient should be evaluated in the office, they were directed there. The patient verbally consented to this visit.  Location of the patient: home Location of the provider: work Those involved with  this call:  Provider: Geraldine Kling, MD CMA: Mancil Seat, CMA Time spent on call: 15 minutes with patient face to face via video conference. More than 50% of this time was spent in counseling and coordination of care. 15 minutes total spent in review of patient's record and preparation of their chart. Total time spent on this encounter: 30 minutes.

## 2023-12-13 ENCOUNTER — Other Ambulatory Visit (HOSPITAL_COMMUNITY): Payer: Self-pay

## 2023-12-17 ENCOUNTER — Other Ambulatory Visit: Payer: Self-pay

## 2023-12-17 ENCOUNTER — Other Ambulatory Visit (HOSPITAL_COMMUNITY): Payer: Self-pay

## 2023-12-17 ENCOUNTER — Telehealth: Payer: Self-pay

## 2023-12-17 MED ORDER — CLONAZEPAM 1 MG PO TABS
1.0000 mg | ORAL_TABLET | Freq: Every evening | ORAL | 1 refills | Status: DC | PRN
  Filled 2023-12-17: qty 30, 30d supply, fill #0
  Filled 2024-01-17: qty 30, 30d supply, fill #1

## 2023-12-17 NOTE — Progress Notes (Signed)
   12/17/2023  Patient ID: Brooke [name], female   DOB: 1946/04/08, 78 y.o.   MRN: [id number]  Returning missed call/voicemail from patient's daughter stating patient is out of clonazepam  1mg  used at bedtime for sleep and is running low on 0.5mg  clonazepam  used BID as needed for anxiety.  Patient had recent virtual visit with Dr. Juliette Oh where she indicated therapy was to be continued, but the pharmacy does not have active refills for clonazepam  1mg .  Patient has enough 0.5mg  tablets to make it through the weekend, per daughter.  Order pending for clonazepam  1mg  hs prn for Dr. Juliette Oh to sign if I agreement; and I will request [facility] to fill active refill for 0.5mg , also.  Linn Rich, PharmD, DPLA

## 2023-12-21 ENCOUNTER — Other Ambulatory Visit: Payer: Self-pay

## 2023-12-22 ENCOUNTER — Other Ambulatory Visit (HOSPITAL_COMMUNITY): Payer: Self-pay

## 2023-12-28 ENCOUNTER — Other Ambulatory Visit (HOSPITAL_COMMUNITY): Payer: Self-pay

## 2023-12-28 ENCOUNTER — Other Ambulatory Visit: Payer: Self-pay

## 2023-12-31 ENCOUNTER — Other Ambulatory Visit: Payer: Self-pay

## 2024-01-11 ENCOUNTER — Ambulatory Visit: Admitting: Nurse Practitioner

## 2024-01-11 ENCOUNTER — Ambulatory Visit: Payer: Self-pay | Admitting: *Deleted

## 2024-01-11 NOTE — Telephone Encounter (Signed)
 FYI Only or Action Required?: Action required by provider  Patient was last seen in primary care on 12/08/2023 by Hadassah Letters, MD. Called Nurse Triage reporting Dysuria. Symptoms began today. Interventions attempted: Nothing. Symptoms are: gradually worsening.  Triage Disposition: See HCP Within 4 Hours (Or PCP Triage)  Patient/caregiver understands and will follow disposition?: yes         Copied from CRM [id number]. Topic: Clinical - Red Word Triage >> Jan 11, 2024  8:20 AM Emylou G wrote: [facility] that prompted transfer to Nurse Triage: burning urine..thinks it's an UTI? Reason for Disposition  [1] SEVERE pain with urination (e.g., excruciating) AND [2] not improved after 2 hours of pain medicine and Sitz bath    Not severe pain hx UTIs  Answer Assessment - Initial Assessment Questions 1. SEVERITY: "How bad is the pain?"  (e.g., Scale 1-10; mild, moderate, or severe)   - MILD (1-3): complains slightly about urination hurting   - MODERATE (4-7): interferes with normal activities     - SEVERE (8-10): excruciating, unwilling or unable to urinate because of the pain      Mild  2. FREQUENCY: "How many times have you had painful urination today?"      Na  3. PATTERN: "Is pain present every time you urinate or just sometimes?"      Na  4. ONSET: "When did the painful urination start?"      This am  5. FEVER: "Do you have a fever?" If Yes, ask: "What is your temperature, how was it measured, and when did it start?"     na 6. PAST UTI: "Have you had a urine infection before?" If Yes, ask: "When was the last time?" and "What happened that time?"      Burning with urination 7. CAUSE: "What do you think is causing the painful urination?"  (e.g., UTI, scratch, Herpes sore)     UTi  8. OTHER SYMPTOMS: "Do you have any other symptoms?" (e.g., blood in urine, flank pain, genital sores, urgency, vaginal discharge)     Burning with urination 9. PREGNANCY: "Is there any chance you are  pregnant?" "When was your last menstrual period?"     Na   Patient daughter reports patient does not want to come in for OV but appt has been scheduled with other provider due to patient could not make appt in time this am with PCP. Daughter reports if PCP ok with daughter collecting urine sample she will come in and get specimen container. Please advise . Appt was made for 4 pm today .  Protocols used: Urination Pain - Female-A-AH

## 2024-01-12 ENCOUNTER — Other Ambulatory Visit: Payer: Self-pay

## 2024-01-12 ENCOUNTER — Other Ambulatory Visit: Payer: Self-pay | Admitting: Pediatrics

## 2024-01-12 ENCOUNTER — Other Ambulatory Visit: Payer: Self-pay | Admitting: Obstetrics and Gynecology

## 2024-01-17 ENCOUNTER — Other Ambulatory Visit (HOSPITAL_COMMUNITY): Payer: Self-pay

## 2024-01-17 ENCOUNTER — Other Ambulatory Visit: Payer: Self-pay

## 2024-01-20 ENCOUNTER — Other Ambulatory Visit (HOSPITAL_COMMUNITY): Payer: Self-pay

## 2024-01-26 ENCOUNTER — Ambulatory Visit: Admitting: Internal Medicine

## 2024-02-01 ENCOUNTER — Other Ambulatory Visit: Payer: Self-pay

## 2024-02-01 ENCOUNTER — Other Ambulatory Visit: Payer: Self-pay | Admitting: Pediatrics

## 2024-02-02 ENCOUNTER — Other Ambulatory Visit: Payer: Self-pay

## 2024-02-02 ENCOUNTER — Other Ambulatory Visit: Payer: Self-pay | Admitting: Pediatrics

## 2024-02-02 MED ORDER — OMEPRAZOLE 20 MG PO CPDR
20.0000 mg | DELAYED_RELEASE_CAPSULE | Freq: Two times a day (BID) | ORAL | 0 refills | Status: DC
  Filled 2024-02-02: qty 60, 30d supply, fill #0
  Filled 2024-03-02: qty 60, 30d supply, fill #1
  Filled 2024-03-29 – 2024-04-04 (×2): qty 60, 30d supply, fill #2

## 2024-02-02 NOTE — Telephone Encounter (Signed)
 Requested Prescriptions  Pending Prescriptions Disp Refills   aspirin  EC (ASPIRIN  LOW DOSE) 81 MG tablet 90 tablet 1    Sig: Take 1 tablet (81 mg total) by mouth daily. Swallow whole,     Analgesics:  NSAIDS - aspirin  Passed - 02/02/2024  5:22 PM      Passed - Cr in normal range and within 360 days    Creatinine, Ser  Date Value Ref Range Status  10/28/2023 0.77 0.57 - 1.00 mg/dL Final         Passed - eGFR is 10 or above and within 360 days    GFR calc Af Amer  Date Value Ref Range Status  05/12/2016 >60 >60 mL/min Final    Comment:    (NOTE) The eGFR has been calculated using the CKD EPI equation. This calculation has not been validated in all clinical situations. eGFR's persistently <60 mL/min signify possible Chronic Kidney Disease.    GFR, Estimated  Date Value Ref Range Status  10/18/2023 >60 >60 mL/min Final    Comment:    (NOTE) Calculated using the CKD-EPI Creatinine Equation (2021)    eGFR  Date Value Ref Range Status  10/28/2023 79 >59 mL/min/1.73 Final         Passed - Patient is not pregnant      Passed - Valid encounter within last 12 months    Recent Outpatient Visits           1 month ago Major depressive disorder with single episode, in full remission [facility])   [place] [facility] Herold Hadassah SQUIBB, MD   2 months ago Major depressive disorder with single episode, in full remission [facility])   [place] [facility] Herold Hadassah SQUIBB, MD   3 months ago Hospital discharge follow-up   [place] [facility] Herold Hadassah SQUIBB, MD   3 months ago Urinary tract infection without hematuria, site unspecified   [place] [facility] Melvin Pao, NP   4 months ago COVID-19 virus infection   [place] [facility] Herold Hadassah SQUIBB, MD       Future Appointments             In 1 month Herold, Hadassah SQUIBB, MD [place] [facility]             omeprazole  (PRILOSEC) 20  MG capsule 180 capsule 0    Sig: Take 1 capsule (20 mg total) by mouth 2 (two) times daily before a meal     Gastroenterology: Proton Pump Inhibitors Passed - 02/02/2024  5:22 PM      Passed - Valid encounter within last 12 months    Recent Outpatient Visits           1 month ago Major depressive disorder with single episode, in full remission [facility])   [place] [facility] Herold Hadassah SQUIBB, MD   2 months ago Major depressive disorder with single episode, in full remission [facility])   [place] [facility] Herold Hadassah SQUIBB, MD   3 months ago Hospital discharge follow-up   [place] [facility] Herold Hadassah SQUIBB, MD   3 months ago Urinary tract infection without hematuria, site unspecified   [place] [facility] Melvin Pao, NP   4 months ago COVID-19 virus infection    [facility] [place], Hadassah SQUIBB, MD       Future Appointments  In 1 month Herold, Hadassah SQUIBB, MD [facility]

## 2024-02-03 ENCOUNTER — Other Ambulatory Visit: Payer: Self-pay

## 2024-02-03 ENCOUNTER — Other Ambulatory Visit (HOSPITAL_COMMUNITY): Payer: Self-pay

## 2024-02-08 ENCOUNTER — Other Ambulatory Visit (HOSPITAL_COMMUNITY): Payer: Self-pay

## 2024-02-08 ENCOUNTER — Other Ambulatory Visit: Payer: Self-pay

## 2024-02-10 ENCOUNTER — Other Ambulatory Visit (HOSPITAL_COMMUNITY): Payer: Self-pay

## 2024-02-11 ENCOUNTER — Other Ambulatory Visit: Payer: Self-pay

## 2024-02-11 ENCOUNTER — Other Ambulatory Visit (HOSPITAL_COMMUNITY): Payer: Self-pay

## 2024-02-11 ENCOUNTER — Other Ambulatory Visit: Payer: Self-pay | Admitting: Pediatrics

## 2024-02-14 ENCOUNTER — Other Ambulatory Visit (HOSPITAL_COMMUNITY): Payer: Self-pay

## 2024-02-14 ENCOUNTER — Other Ambulatory Visit: Payer: Self-pay

## 2024-02-14 MED ORDER — CLONAZEPAM 1 MG PO TABS
1.0000 mg | ORAL_TABLET | Freq: Every evening | ORAL | 0 refills | Status: DC | PRN
  Filled 2024-02-14 – 2024-02-15 (×3): qty 25, 25d supply, fill #0

## 2024-02-14 NOTE — Telephone Encounter (Signed)
 Requested medication (s) are due for refill today: yes  Requested medication (s) are on the active medication list: yes  Last refill:  12/17/23  Future visit scheduled: yes  Notes to clinic:  Unable to refill per protocol, cannot delegate.      Requested Prescriptions  Pending Prescriptions Disp Refills   clonazePAM  (KLONOPIN ) 1 MG tablet 30 tablet 1    Sig: Take 1 tablet (1 mg total) by mouth at bedtime as needed.     Not Delegated - Psychiatry: Anxiolytics/Hypnotics 2 Failed - 02/14/2024  8:20 AM      Failed - This refill cannot be delegated      Failed - Urine Drug Screen completed in last 360 days      Passed - Patient is not pregnant      Passed - Valid encounter within last 6 months    Recent Outpatient Visits           2 months ago Major depressive disorder with single episode, in full remission [facility])   [place] [facility] Herold Hadassah SQUIBB, MD   3 months ago Major depressive disorder with single episode, in full remission [facility])   [place] [facility] Herold Hadassah SQUIBB, MD   3 months ago Hospital discharge follow-up   [place] [facility] Herold Hadassah SQUIBB, MD   4 months ago Urinary tract infection without hematuria, site unspecified   [place] [facility] Melvin Pao, NP   5 months ago COVID-19 virus infection   [place] [facility] Herold Hadassah SQUIBB, MD       Future Appointments             In 3 weeks Herold, Hadassah SQUIBB, MD [facility]

## 2024-02-15 ENCOUNTER — Other Ambulatory Visit: Payer: Self-pay

## 2024-02-15 ENCOUNTER — Other Ambulatory Visit (HOSPITAL_COMMUNITY): Payer: Self-pay

## 2024-02-18 ENCOUNTER — Other Ambulatory Visit: Payer: Self-pay

## 2024-02-18 ENCOUNTER — Other Ambulatory Visit (HOSPITAL_COMMUNITY): Payer: Self-pay

## 2024-02-24 ENCOUNTER — Other Ambulatory Visit: Payer: Self-pay

## 2024-03-02 ENCOUNTER — Other Ambulatory Visit: Payer: Self-pay | Admitting: Pediatrics

## 2024-03-02 ENCOUNTER — Other Ambulatory Visit: Payer: Self-pay

## 2024-03-02 NOTE — Telephone Encounter (Signed)
 Requested Prescriptions  Refused Prescriptions Disp Refills   losartan  (COZAAR ) 25 MG tablet 90 tablet 2    Sig: Take 1 tablet (25 mg total) by mouth in the morning and at bedtime.     Cardiovascular:  Angiotensin Receptor Blockers Passed - 03/02/2024  4:39 PM      Passed - Cr in normal range and within 180 days    Creatinine, Ser  Date Value Ref Range Status  10/28/2023 0.77 0.57 - 1.00 mg/dL Final         Passed - K in normal range and within 180 days    Potassium  Date Value Ref Range Status  10/28/2023 4.2 3.5 - 5.2 mmol/L Final         Passed - Patient is not pregnant      Passed - Last BP in normal range    BP Readings from Last 1 Encounters:  11/11/23 134/82         Passed - Valid encounter within last 6 months    Recent Outpatient Visits           2 months ago Major depressive disorder with single episode, in full remission [facility])   [place] [facility] Herold Hadassah SQUIBB, MD   3 months ago Major depressive disorder with single episode, in full remission [facility])   [place] [facility] Herold Hadassah SQUIBB, MD   4 months ago Hospital discharge follow-up   [place] [facility] Herold Hadassah SQUIBB, MD   4 months ago Urinary tract infection without hematuria, site unspecified   [place] [facility] Melvin Pao, NP   5 months ago COVID-19 virus infection    [facility] Herold Hadassah SQUIBB, MD       Future Appointments             In 6 days Herold, Hadassah SQUIBB, MD [facility], [facility]

## 2024-03-03 ENCOUNTER — Other Ambulatory Visit: Payer: Self-pay

## 2024-03-06 ENCOUNTER — Other Ambulatory Visit: Payer: Self-pay | Admitting: Family Medicine

## 2024-03-06 ENCOUNTER — Other Ambulatory Visit (HOSPITAL_COMMUNITY): Payer: Self-pay

## 2024-03-06 ENCOUNTER — Other Ambulatory Visit: Payer: Self-pay | Admitting: Pediatrics

## 2024-03-06 DIAGNOSIS — F419 Anxiety disorder, unspecified: Secondary | ICD-10-CM

## 2024-03-06 DIAGNOSIS — F325 Major depressive disorder, single episode, in full remission: Secondary | ICD-10-CM

## 2024-03-06 MED ORDER — LOSARTAN POTASSIUM 25 MG PO TABS
25.0000 mg | ORAL_TABLET | Freq: Two times a day (BID) | ORAL | 3 refills | Status: DC
  Filled 2024-03-06 – 2024-03-08 (×2): qty 60, 30d supply, fill #0
  Filled 2024-03-29 – 2024-04-04 (×2): qty 60, 30d supply, fill #1
  Filled 2024-05-04 – 2024-05-09 (×5): qty 60, 30d supply, fill #2
  Filled 2024-06-01: qty 60, 30d supply, fill #3

## 2024-03-07 ENCOUNTER — Other Ambulatory Visit (HOSPITAL_COMMUNITY): Payer: Self-pay

## 2024-03-07 ENCOUNTER — Other Ambulatory Visit: Payer: Self-pay

## 2024-03-07 NOTE — Telephone Encounter (Signed)
 Requested medications are due for refill today.  yes  Requested medications are on the active medications list.  yes  Last refill. 12/10/2023 #90 1 rf  Future visit scheduled.   yes  Notes to clinic.  Refill not delegated.    Requested Prescriptions  Pending Prescriptions Disp Refills   clonazePAM  (KLONOPIN ) 0.5 MG tablet 90 tablet 1    Sig: Take 1 tablet (0.5 mg total) by mouth 2 (two) times daily.     Not Delegated - Psychiatry: Anxiolytics/Hypnotics 2 Failed - 03/07/2024 11:39 AM      Failed - This refill cannot be delegated      Failed - Urine Drug Screen completed in last 360 days      Passed - Patient is not pregnant      Passed - Valid encounter within last 6 months    Recent Outpatient Visits           3 months ago Major depressive disorder with single episode, in full remission [facility])   [place] [facility] Herold Hadassah SQUIBB, MD   3 months ago Major depressive disorder with single episode, in full remission [facility])   [place] [facility] Herold Hadassah SQUIBB, MD   4 months ago Hospital discharge follow-up   [place] [facility] Herold Hadassah SQUIBB, MD   4 months ago Urinary tract infection without hematuria, site unspecified   [place] [facility] Melvin Pao, NP   5 months ago COVID-19 virus infection   [place] [facility], Hadassah SQUIBB, MD       Future Appointments             [place] Herold, Hadassah SQUIBB, MD [place] [facility], [facility]

## 2024-03-07 NOTE — Telephone Encounter (Signed)
 Requested medications are due for refill today.  yes  Requested medications are on the active medications list.  yes  Last refill. 02/14/2024 #25 0 rf  Future visit scheduled.   yes  Notes to clinic.  Refill not delegated.    Requested Prescriptions  Pending Prescriptions Disp Refills   clonazePAM  (KLONOPIN ) 1 MG tablet 25 tablet 0    Sig: Take 1 tablet (1 mg total) by mouth at bedtime as needed for up to 25 days.     Not Delegated - Psychiatry: Anxiolytics/Hypnotics 2 Failed - 03/07/2024 11:40 AM      Failed - This refill cannot be delegated      Failed - Urine Drug Screen completed in last 360 days      Passed - Patient is not pregnant      Passed - Valid encounter within last 6 months    Recent Outpatient Visits           3 months ago Major depressive disorder with single episode, in full remission [facility])   [place] [facility] Herold Hadassah SQUIBB, MD   3 months ago Major depressive disorder with single episode, in full remission [facility])   [place] [facility] Herold Hadassah SQUIBB, MD   4 months ago Hospital discharge follow-up   [place] [facility] Herold Hadassah SQUIBB, MD   4 months ago Urinary tract infection without hematuria, site unspecified   [place] [facility] Melvin Pao, NP   5 months ago COVID-19 virus infection   [place] [facility] [place], Hadassah SQUIBB, MD       Future Appointments             Tomorrow Herold, Hadassah SQUIBB, MD [place] [facility], [facility]

## 2024-03-08 ENCOUNTER — Telehealth: Admitting: Pediatrics

## 2024-03-08 ENCOUNTER — Other Ambulatory Visit: Payer: Self-pay

## 2024-03-08 ENCOUNTER — Other Ambulatory Visit (HOSPITAL_COMMUNITY): Payer: Self-pay

## 2024-03-08 DIAGNOSIS — R4189 Other symptoms and signs involving cognitive functions and awareness: Secondary | ICD-10-CM | POA: Diagnosis not present

## 2024-03-08 DIAGNOSIS — F419 Anxiety disorder, unspecified: Secondary | ICD-10-CM | POA: Diagnosis not present

## 2024-03-08 DIAGNOSIS — Z79899 Other long term (current) drug therapy: Secondary | ICD-10-CM | POA: Diagnosis not present

## 2024-03-08 DIAGNOSIS — F325 Major depressive disorder, single episode, in full remission: Secondary | ICD-10-CM | POA: Diagnosis not present

## 2024-03-08 MED ORDER — CLONAZEPAM 1 MG PO TABS
1.0000 mg | ORAL_TABLET | Freq: Every evening | ORAL | 0 refills | Status: DC | PRN
  Filled 2024-03-08 – 2024-03-13 (×2): qty 30, 30d supply, fill #0

## 2024-03-08 MED ORDER — CLONAZEPAM 0.5 MG PO TABS
0.2500 mg | ORAL_TABLET | Freq: Two times a day (BID) | ORAL | 1 refills | Status: DC | PRN
  Filled 2024-03-08 – 2024-03-13 (×2): qty 90, 45d supply, fill #0
  Filled 2024-03-31 – 2024-04-13 (×3): qty 90, 45d supply, fill #1

## 2024-03-08 NOTE — Progress Notes (Signed)
 Telehealth Visit  I connected with  [name] on 03/14/24 by a video enabled telemedicine application and verified that I am speaking with the correct person using two identifiers.   I discussed the limitations of evaluation and management by telemedicine. The patient expressed understanding and agreed to proceed.  Subjective:    Patient ID: Brooke [name], female    DOB: 05/22/1946, 78 y.o.   MRN: [id number]  HPI: Brooke [name] is a 78 y.o. female  Chief Complaint  Patient presents with   Depression    Pt needing refills     Discussed the use of AI scribe software for clinical note transcription with the patient, who gave verbal consent to proceed.  History of Present Illness   Brooke [name] is a 78 year old female who presents with concerns of memory issues and medication management. She is accompanied by her daughter, who is her primary caregiver.  She is experiencing memory issues, including repetitive questioning and forgetfulness. She frequently asks the same questions multiple times, such as inquiring about coffee availability, and forgets recent conversations. She also has difficulty remembering events related to her late husband, such as his medical treatments.  Her current medication regimen includes 0.5 mg taken twice a day and 1 mg at night. Her daughter manages her medications. The patient and her daughter report no dizziness or confusion with the current dosage. There is concern about her frequent use of Advil, which her daughter tries to limit due to potential stomach issues.  Her sleep is reported as fine, and her mood is described as 'pretty good,' though she experiences some ups and downs. She notes improvement in her headaches with the current medication regimen.  There are concerns about her bowel habits, as she is unaware of when she is having bowel movements, leading to accidents. Her daughter notes that she makes a mess in the bathroom, which is  challenging to manage.  Socially, she has no friends and limited family interaction, as she historically did not engage with others, including her daughter and grandson. Her daughter is her primary support, fulfilling a promise to her late husband to care for her.      Relevant past medical, surgical, family and social history reviewed and updated as indicated. Interim medical history since our last visit reviewed. Allergies and medications reviewed and updated.  ROS per HPI unless specifically indicated above     Objective:    There were no vitals taken for this visit.  Wt Readings from Last 3 Encounters:  11/11/23 112 lb (50.8 kg)  10/28/23 107 lb (48.5 kg)  10/16/23 111 lb 1.8 oz (50.4 kg)     Physical Exam Constitutional:      General: She is not in acute distress.    Appearance: Normal appearance.  Neurological:     General: No focal deficit present.     Mental Status: She is alert. Mental status is at baseline.      LIMITED EXAM GIVEN VIDEO VISIT     Assessment & Plan:  Assessment & Plan   Polypharmacy Assessment & Plan: We have consolidated several of her medications but I continue to be concerned about the benzo she is on, particularly given recent increased dosing in setting of husband's death. Will re-evaluate with patient in person.   Major depressive disorder with single episode, in full remission [facility]) Anxiety Assessment & Plan: Stable w comorbid anxiety, manages with prozac  40mg  and klonopin . Discuss benzo titration if needed.  Orders: -     clonazePAM ; Take 1 tablet (1 mg total) by mouth at bedtime as needed.  Dispense: 30 tablet; Refill: 0 -     clonazePAM ; Take 0.5-1 tablets (0.25-0.5 mg total) by mouth 2 (two) times daily as needed for anxiety.  Dispense: 90 tablet; Refill: 1   Cognitive impairment Assessment & Plan: Cognitive impairment with memory issues and confusion concerns discussed today with patient's daughter. Will need to do in person  [facility] and ADL/iADL assessment. I have previously been concerned about polypharmacy contributing to confusion so will need to reassess.  - Perform ATN blood testing - Conduct cognitive evaluation to differentiate between medication effects (given previous concerns for polypharmacy and dementia.    Follow up plan: Return in about 2 weeks (around 03/22/2024) for (virtual).  [name], MD   This visit was completed via video visit through MyChart due to the restrictions of the COVID-19 pandemic. All issues as above were discussed and addressed. Physical exam was done as above through visual confirmation on video through MyChart. If it was felt that the patient should be evaluated in the office, they were directed there. The patient verbally consented to this visit. Location of the patient: home Location of the provider: work Those involved with this call:  Provider: Hadassah Nett, MD CMA: Cena Maffucci, CMA Time spent on call: 15 minutes with patient face to face via video conference. More than 50% of this time was spent in counseling and coordination of care. 15 minutes total spent in review of patient's record and preparation of their chart. Total time spent on this encounter: 30 minutes.

## 2024-03-09 ENCOUNTER — Other Ambulatory Visit: Payer: Self-pay

## 2024-03-13 ENCOUNTER — Other Ambulatory Visit: Payer: Self-pay

## 2024-03-13 ENCOUNTER — Other Ambulatory Visit (HOSPITAL_COMMUNITY): Payer: Self-pay

## 2024-03-14 ENCOUNTER — Encounter: Payer: Self-pay | Admitting: Pediatrics

## 2024-03-14 DIAGNOSIS — R4189 Other symptoms and signs involving cognitive functions and awareness: Secondary | ICD-10-CM | POA: Insufficient documentation

## 2024-03-14 NOTE — Assessment & Plan Note (Signed)
 We have consolidated several of her medications but I continue to be concerned about the benzo she is on, particularly given recent increased dosing in setting of husband's death. Will re-evaluate with patient in person.

## 2024-03-14 NOTE — Assessment & Plan Note (Deleted)
 Cognitive impairment with memory issues and confusion concerns discussed today with patient's daughter. Will need to do in person [facility] and ADL/iADL assessment. I have previously been concerned about polypharmacy contributing to confusion so will need to reassess.  - Perform ATN blood testing - Conduct cognitive evaluation to differentiate between medication effects (given previous concerns for polypharmacy and dementia.

## 2024-03-14 NOTE — Assessment & Plan Note (Signed)
 Stable w comorbid anxiety, manages with prozac  40mg  and klonopin . Discuss benzo titration if needed.

## 2024-03-14 NOTE — Assessment & Plan Note (Signed)
 Cognitive impairment with memory issues and confusion concerns discussed today with patient's daughter. Will need to do in person [facility] and ADL/iADL assessment. I have previously been concerned about polypharmacy contributing to confusion so will need to reassess.  - Perform ATN blood testing - Conduct cognitive evaluation to differentiate between medication effects (given previous concerns for polypharmacy and dementia.

## 2024-03-24 ENCOUNTER — Other Ambulatory Visit: Payer: Self-pay

## 2024-03-27 ENCOUNTER — Other Ambulatory Visit: Payer: Self-pay

## 2024-03-29 ENCOUNTER — Other Ambulatory Visit: Payer: Self-pay | Admitting: Obstetrics and Gynecology

## 2024-03-29 ENCOUNTER — Other Ambulatory Visit: Payer: Self-pay

## 2024-03-31 ENCOUNTER — Other Ambulatory Visit (HOSPITAL_COMMUNITY): Payer: Self-pay

## 2024-03-31 ENCOUNTER — Ambulatory Visit (INDEPENDENT_AMBULATORY_CARE_PROVIDER_SITE_OTHER): Admitting: Pediatrics

## 2024-03-31 ENCOUNTER — Other Ambulatory Visit: Payer: Self-pay | Admitting: Pediatrics

## 2024-03-31 ENCOUNTER — Other Ambulatory Visit: Payer: Self-pay

## 2024-03-31 VITALS — BP 138/85 | HR 67 | Temp 97.7°F | Wt 112.4 lb

## 2024-03-31 DIAGNOSIS — R4189 Other symptoms and signs involving cognitive functions and awareness: Secondary | ICD-10-CM | POA: Diagnosis not present

## 2024-03-31 DIAGNOSIS — Z79899 Other long term (current) drug therapy: Secondary | ICD-10-CM

## 2024-03-31 DIAGNOSIS — F419 Anxiety disorder, unspecified: Secondary | ICD-10-CM | POA: Diagnosis not present

## 2024-03-31 DIAGNOSIS — F325 Major depressive disorder, single episode, in full remission: Secondary | ICD-10-CM

## 2024-03-31 DIAGNOSIS — R5381 Other malaise: Secondary | ICD-10-CM

## 2024-03-31 MED ORDER — CLONAZEPAM 0.5 MG PO TABS
0.5000 mg | ORAL_TABLET | Freq: Every evening | ORAL | 0 refills | Status: DC | PRN
Start: 2024-03-31 — End: 2024-04-20
  Filled 2024-03-31 – 2024-04-13 (×3): qty 30, 30d supply, fill #0

## 2024-03-31 NOTE — Progress Notes (Signed)
 Office Visit  BP 138/85   Pulse 67   Temp 97.7 F (36.5 C) (Oral)   Wt 112 lb 6.4 oz (51 kg)   BMI 21.95 kg/m    Subjective:    Patient ID: [name], female    DOB: 09-21-1945, 78 y.o.   MRN: [id number]  HPI: WILLADENE MOUNSEY is a 78 y.o. female  Chief Complaint  Patient presents with   Depression    Discussed the use of AI scribe software for clinical note transcription with the patient, who gave verbal consent to proceed.  History of Present Illness   TARENA GOCKLEY is a 78 year old female who presents for a memory evaluation due to concerns about potential dementia.  She is experiencing memory issues, which have been a concern for some time. Her memory feels stable and about the same as before, with no significant changes noted by her.  She is currently taking Klonopin , with a dosage of 0.5 mg twice a day and a stronger dose at night. Her daughter notes that the Klonopin  was increased after her husband passed away, and she has been using it consistently three times a day. The medication helps her, and she is hesitant to change the dosage.  There is also a concern about her behavior, as she sometimes acts out or is difficult to manage, which has made it challenging for her daughter to find someone willing to stay with her.  Her daughter also mentions that her eating habits have changed; she is eating more now than she did when her husband was alive. Her daughter prepares meals for her, including breakfast, lunch, and dinner, and notes that her weight is stable, providing a cushion in case of falls.  No significant changes in her memory are noticed by her, and she has not discussed these issues at home.        Relevant past medical, surgical, family and social history reviewed and updated as indicated. Interim medical history since our last visit reviewed. Allergies and medications reviewed and updated.  ROS per HPI unless specifically indicated above      Objective:    BP 138/85   Pulse 67   Temp 97.7 F (36.5 C) (Oral)   Wt 112 lb 6.4 oz (51 kg)   BMI 21.95 kg/m   Wt Readings from Last 3 Encounters:  03/31/24 112 lb 6.4 oz (51 kg)  11/11/23 112 lb (50.8 kg)  10/28/23 107 lb (48.5 kg)     Physical Exam Constitutional:      Appearance: Normal appearance.  Pulmonary:     Effort: Pulmonary effort is normal.  Musculoskeletal:        General: Normal range of motion.  Skin:    Comments: Normal skin color  Neurological:     General: No focal deficit present.     Mental Status: She is alert. Mental status is at baseline.  Psychiatric:        Mood and Affect: Mood normal.        Behavior: Behavior normal.        Thought Content: Thought content normal.         03/31/2024   11:12 AM 12/08/2023   10:57 AM 10/28/2023   11:48 AM 10/14/2023    9:52 AM 06/02/2023    1:17 PM  Depression screen PHQ 2/9  Decreased Interest 3 0 2 1 0  Down, Depressed, Hopeless 3 0 2 1 0  PHQ - 2 Score 6 0  4 2 0  Altered sleeping 0 0 2 0 0  Tired, decreased energy 1 0 2 0 0  Change in appetite 1 0 0 0 0  Feeling bad or failure about yourself  0 0 0 0 0  Trouble concentrating 2 0 1 0 0  Moving slowly or fidgety/restless 2 0 0 0 0  Suicidal thoughts 0 0 0 0 0  PHQ-9 Score 12 0 9 2 0  Difficult doing work/chores Not difficult at all Not difficult at all Somewhat difficult  Not difficult at all       03/31/2024   11:12 AM 12/08/2023   10:58 AM 10/28/2023   11:48 AM 06/02/2023    1:17 PM  GAD 7 : Generalized Anxiety Score  Nervous, Anxious, on Edge 2 0 3 0  Control/stop worrying 2 0 1 0  Worry too much - different things 1 0 1 0  Trouble relaxing 1 0 1 0  Restless 0 0 0 0  Easily annoyed or irritable 2 0 0 0  Afraid - awful might happen 1 0 0 0  Total GAD 7 Score 9 0 6 0  Anxiety Difficulty Somewhat difficult Not difficult at all Somewhat difficult Not difficult at all   [facility] 11/30--->WILL BE SCANNED TO CHART    Assessment & Plan:   Assessment & Plan   Cognitive impairment Assessment & Plan: Patterns consistent with dementia, including memory issues and behavioral changes. Clonazepam  and other chronic medications may contribute to confusion but continues to decline discontinuation. [facility] 11/30 today. Family concerned about behavior impact. - Refer to [facility] to assess for memory care facility and needs. - Order blood work to rule out reversible causes of cognitive changes. - Trial reduction of clonazepam  to half the dose at night and monitor effects. - Initiate social work consult to explore support options for the family. - Consider palliative care introduction for additional support. - Schedule follow-up in 1-2 weeks to review lab results and care plan.   Orders: -     CBC with Differential/Platelet -     TSH -     Basic metabolic panel with GFR -     ATN PROFILE -     Lipid panel -     Hemoglobin A1c -     Vitamin B12 -     Folate -     Iron , TIBC and Ferritin Panel -     VITAMIN D  25 Hydroxy (Vit-D Deficiency, Fractures) -     Ambulatory referral to Geriatrics -     AMB Referral VBCI Care Management -     Ambulatory referral to Home Health  Polypharmacy Assessment & Plan: Suspect may be contributing to debility, gait issues as well as cognitive decline. Referrals as above.  Orders: -     Ambulatory referral to Geriatrics -     AMB Referral VBCI Care Management -     Ambulatory referral to Home Health  Major depressive disorder with single episode, in full remission [facility]) Anxiety Assessment & Plan: Chronic, stable but overall declined since husband's death. Unclear how much of her functional impairment is from primary psychiatric disorder vs cognitive impairment/dementia. Plan to refer to multiple services as above.  Orders: -     clonazePAM ; Take 1 tablet (0.5 mg total) by mouth at bedtime as needed.  Dispense: 30 tablet; Refill: 0 -     Ambulatory referral to Geriatrics -     AMB  Referral VBCI Care Management -  Ambulatory referral to Home Health  Physical deconditioning Debility Cognitive impairment/dementia, poor gait, functional decline, physical deconditioning, caregiver stress. Discussed in detail above, referrals placed. -     Ambulatory referral to Geriatrics -     AMB Referral VBCI Care Management -     Ambulatory referral to Home Health    Follow up plan: No follow-ups on file.  [name], MD

## 2024-04-04 ENCOUNTER — Other Ambulatory Visit: Payer: Self-pay

## 2024-04-05 ENCOUNTER — Other Ambulatory Visit: Payer: Self-pay | Admitting: Pediatrics

## 2024-04-05 ENCOUNTER — Telehealth: Payer: Self-pay

## 2024-04-05 ENCOUNTER — Other Ambulatory Visit (HOSPITAL_COMMUNITY): Payer: Self-pay

## 2024-04-05 ENCOUNTER — Other Ambulatory Visit: Payer: Self-pay

## 2024-04-05 LAB — IRON,TIBC AND FERRITIN PANEL
Ferritin: 60 ng/mL (ref 15–150)
Iron Saturation: 32 % (ref 15–55)
Iron: 85 ug/dL (ref 27–139)
Total Iron Binding Capacity: 266 ug/dL (ref 250–450)
UIBC: 181 ug/dL (ref 118–369)

## 2024-04-05 LAB — BASIC METABOLIC PANEL WITH GFR
BUN/Creatinine Ratio: 14 (ref 12–28)
BUN: 12 mg/dL (ref 8–27)
CO2: 24 mmol/L (ref 20–29)
Calcium: 9 mg/dL (ref 8.7–10.3)
Chloride: 98 mmol/L (ref 96–106)
Creatinine, Ser: 0.83 mg/dL (ref 0.57–1.00)
Glucose: 81 mg/dL (ref 70–99)
Potassium: 4.4 mmol/L (ref 3.5–5.2)
Sodium: 136 mmol/L (ref 134–144)
eGFR: 72 mL/min/1.73 (ref >59)

## 2024-04-05 LAB — ATN PROFILE
A -- Beta-amyloid 42/40 Ratio: 0.117 (ref >0.102)
Beta-amyloid 40: 176.38 pg/mL
Beta-amyloid 42: 20.6 pg/mL
N -- NfL, Plasma: 10.7 pg/mL — AB (ref 0.00–6.04)
T -- p-tau181: 0.37 pg/mL (ref 0.00–0.97)

## 2024-04-05 LAB — CBC WITH DIFFERENTIAL/PLATELET
Basophils Absolute: 0.1 x10E3/uL (ref 0.0–0.2)
Basos: 1 %
EOS (ABSOLUTE): 0.2 x10E3/uL (ref 0.0–0.4)
Eos: 3 %
Hematocrit: 37.9 % (ref 34.0–46.6)
Hemoglobin: 12.1 g/dL (ref 11.1–15.9)
Immature Grans (Abs): 0 x10E3/uL (ref 0.0–0.1)
Immature Granulocytes: 0 %
Lymphocytes Absolute: 1.2 x10E3/uL (ref 0.7–3.1)
Lymphs: 19 %
MCH: 30.1 pg (ref 26.6–33.0)
MCHC: 31.9 g/dL (ref 31.5–35.7)
MCV: 94 fL (ref 79–97)
Monocytes Absolute: 0.5 x10E3/uL (ref 0.1–0.9)
Monocytes: 9 %
Neutrophils Absolute: 4.2 x10E3/uL (ref 1.4–7.0)
Neutrophils: 68 %
Platelets: 237 x10E3/uL (ref 150–450)
RBC: 4.02 x10E6/uL (ref 3.77–5.28)
RDW: 13.2 % (ref 11.7–15.4)
WBC: 6.2 x10E3/uL (ref 3.4–10.8)

## 2024-04-05 LAB — LIPID PANEL
Chol/HDL Ratio: 2.3 ratio (ref 0.0–4.4)
Cholesterol, Total: 150 mg/dL (ref 100–199)
HDL: 64 mg/dL (ref >39)
LDL Chol Calc (NIH): 74 mg/dL (ref 0–99)
Triglycerides: 57 mg/dL (ref 0–149)
VLDL Cholesterol Cal: 12 mg/dL (ref 5–40)

## 2024-04-05 LAB — TSH: TSH: 1.81 u[IU]/mL (ref 0.450–4.500)

## 2024-04-05 LAB — HEMOGLOBIN A1C
Est. average glucose Bld gHb Est-mCnc: 108 mg/dL
Hgb A1c MFr Bld: 5.4 % (ref 4.8–5.6)

## 2024-04-05 LAB — VITAMIN B12: Vitamin B-12: 293 pg/mL (ref 232–1245)

## 2024-04-05 LAB — FOLATE: Folate: 4.2 ng/mL (ref >3.0)

## 2024-04-05 LAB — VITAMIN D 25 HYDROXY (VIT D DEFICIENCY, FRACTURES): Vit D, 25-Hydroxy: 10.5 ng/mL — ABNORMAL LOW (ref 30.0–100.0)

## 2024-04-05 MED ORDER — ATORVASTATIN CALCIUM 40 MG PO TABS
40.0000 mg | ORAL_TABLET | Freq: Every day | ORAL | 0 refills | Status: DC
  Filled 2024-04-05: qty 30, 30d supply, fill #0
  Filled 2024-05-04 – 2024-05-09 (×5): qty 30, 30d supply, fill #1
  Filled 2024-06-01: qty 30, 30d supply, fill #2

## 2024-04-05 NOTE — Telephone Encounter (Signed)
 Requested Prescriptions  Pending Prescriptions Disp Refills   atorvastatin  (LIPITOR) 40 MG tablet 90 tablet 0    Sig: Take 1 tablet (40 mg total) by mouth daily.     Cardiovascular:  Antilipid - Statins Failed - 04/05/2024  4:09 PM      Failed - Lipid Panel in normal range within the last 12 months    Cholesterol, Total  Date Value Ref Range Status  03/31/2024 150 100 - 199 mg/dL Final   LDL Chol Calc (NIH)  Date Value Ref Range Status  03/31/2024 74 0 - 99 mg/dL Final   HDL  Date Value Ref Range Status  03/31/2024 64 >39 mg/dL Final   Triglycerides  Date Value Ref Range Status  03/31/2024 57 0 - 149 mg/dL Final         Passed - Patient is not pregnant      Passed - Valid encounter within last 12 months    Recent Outpatient Visits           5 days ago Polypharmacy   [place] [facility] Herold Hadassah SQUIBB, MD   4 weeks ago Polypharmacy   [place] [facility] Herold Hadassah SQUIBB, MD   3 months ago Major depressive disorder with single episode, in full remission [facility])   [place] [facility] Herold Hadassah SQUIBB, MD   4 months ago Major depressive disorder with single episode, in full remission [facility])   [place] [facility] Herold Hadassah SQUIBB, MD   5 months ago Hospital discharge follow-up   [place] [facility] Herold Hadassah SQUIBB, MD

## 2024-04-05 NOTE — Progress Notes (Signed)
 Complex Care Management Note Care Guide Note  04/05/2024 Name: Brooke [name] MRN: [id number] DOB: Feb 10, 1946   Complex Care Management Outreach Attempts: An unsuccessful telephone outreach was attempted today to offer the patient information about available complex care management services.  Follow Up Plan:  Additional outreach attempts will be made to offer the patient complex care management information and services.   Encounter Outcome:  No Answer  Jeoffrey Buffalo , RMA     [place]  [facility], [facility] Guide  Direct Dial: [phone]  Website: [email or web address]

## 2024-04-05 NOTE — Progress Notes (Signed)
 Complex Care Management Note  Care Guide Note 04/05/2024 Name: Brooke [name] MRN: [id number] DOB: [birth year]  Brooke [name] is a 78 y.o. year old female who sees Herold, Hadassah SQUIBB, MD for primary care. I reached out to [name] by phone today to offer complex care management services.  Ms. Affinito was given information about Complex Care Management services today including:   The Complex Care Management services include support from the care team which includes your Nurse Care Manager, Clinical Social Worker, or Pharmacist.  The Complex Care Management team is here to help remove barriers to the health concerns and goals most important to you. Complex Care Management services are voluntary, and the patient may decline or stop services at any time by request to their care team member.   Complex Care Management Consent Status: Patient agreed to services and verbal consent obtained.   Follow up plan:  Telephone appointment with complex care management team member scheduled for:  04/14/2024  Encounter Outcome:  Patient Scheduled  Jeoffrey Buffalo , RMA     [place]  [facility], [facility] Guide  Direct Dial: [phone]  Website: [email or web address]

## 2024-04-06 ENCOUNTER — Other Ambulatory Visit: Payer: Self-pay

## 2024-04-06 ENCOUNTER — Other Ambulatory Visit: Payer: Self-pay | Admitting: Pediatrics

## 2024-04-06 ENCOUNTER — Other Ambulatory Visit (HOSPITAL_COMMUNITY): Payer: Self-pay

## 2024-04-06 ENCOUNTER — Ambulatory Visit: Payer: Self-pay | Admitting: Pediatrics

## 2024-04-06 DIAGNOSIS — E559 Vitamin D deficiency, unspecified: Secondary | ICD-10-CM | POA: Insufficient documentation

## 2024-04-06 MED ORDER — VITAMIN D (ERGOCALCIFEROL) 1.25 MG (50000 UNIT) PO CAPS
50000.0000 [IU] | ORAL_CAPSULE | ORAL | 3 refills | Status: DC
  Filled 2024-04-06: qty 12, 84d supply, fill #0

## 2024-04-06 NOTE — Progress Notes (Signed)
 Sent 50,000 q7 day vitamin D  supplementation.  Brooke [name], MD

## 2024-04-07 ENCOUNTER — Other Ambulatory Visit: Payer: Self-pay

## 2024-04-09 ENCOUNTER — Encounter: Payer: Self-pay | Admitting: Pediatrics

## 2024-04-09 NOTE — Assessment & Plan Note (Signed)
 Suspect may be contributing to debility, gait issues as well as cognitive decline. Referrals as above.

## 2024-04-09 NOTE — Assessment & Plan Note (Signed)
 Chronic, stable but overall declined since husband's death. Unclear how much of her functional impairment is from primary psychiatric disorder vs cognitive impairment/dementia. Plan to refer to multiple services as above.

## 2024-04-09 NOTE — Assessment & Plan Note (Signed)
 Patterns consistent with dementia, including memory issues and behavioral changes. Clonazepam  and other chronic medications may contribute to confusion but continues to decline discontinuation. [facility] 11/30 today. Family concerned about behavior impact. - Refer to [facility] to assess for memory care facility and needs. - Order blood work to rule out reversible causes of cognitive changes. - Trial reduction of clonazepam  to half the dose at night and monitor effects. - Initiate social work consult to explore support options for the family. - Consider palliative care introduction for additional support. - Schedule follow-up in 1-2 weeks to review lab results and care plan.

## 2024-04-10 NOTE — Progress Notes (Signed)
 Appt scheduled

## 2024-04-12 ENCOUNTER — Telehealth: Payer: Self-pay

## 2024-04-12 NOTE — Progress Notes (Signed)
 Complex Care Management Care Guide Note  04/12/2024 Name: NYIMAH SHADDUCK MRN: [id number] DOB: 06-Oct-1945  JAMILET AMBROISE is a 78 y.o. year old female who is a primary care patient of Herold Hadassah SQUIBB, MD and is actively engaged with the care management team. I reached out to [name] by phone today to assist with re-scheduling  with the Licensed Clinical Child psychotherapist.  Follow up plan: Unsuccessful telephone outreach attempt made. A HIPAA compliant phone message was left for the patient providing contact information and requesting a return call.  Dreama [facility]  [facility], [facility] VBCI Assistant Direct Dial: [phone]  Fax: [phone]

## 2024-04-13 ENCOUNTER — Telehealth: Payer: Self-pay | Admitting: Pediatrics

## 2024-04-13 ENCOUNTER — Other Ambulatory Visit (HOSPITAL_COMMUNITY): Payer: Self-pay

## 2024-04-13 ENCOUNTER — Other Ambulatory Visit: Payer: Self-pay

## 2024-04-13 NOTE — Telephone Encounter (Signed)
 Copied from CRM [id number]. Topic: Clinical - Prescription Issue >> Apr 13, 2024 10:48 AM Zebedee SAUNDERS wrote: Reason for CRM:Pt's daughter called Tacey Palma [phone] stated pharmacy [place] [facility] [address] [place] [address] Phone: [phone] Fax: [phone], needs script written 0.5 mg 3x daily for insurance purposes. Ms. Tacey would like a call back at [phone].

## 2024-04-14 ENCOUNTER — Telehealth

## 2024-04-14 ENCOUNTER — Other Ambulatory Visit (HOSPITAL_COMMUNITY): Payer: Self-pay

## 2024-04-19 NOTE — Addendum Note (Signed)
 Addended by: HEROLD HADASSAH SQUIBB on: 04/19/2024 12:12 PM   Modules accepted: Orders

## 2024-04-19 NOTE — Telephone Encounter (Signed)
 She is referring to the Clonazepam 

## 2024-04-20 ENCOUNTER — Other Ambulatory Visit: Payer: Self-pay | Admitting: Pediatrics

## 2024-04-20 ENCOUNTER — Other Ambulatory Visit (HOSPITAL_COMMUNITY): Payer: Self-pay

## 2024-04-20 DIAGNOSIS — F325 Major depressive disorder, single episode, in full remission: Secondary | ICD-10-CM

## 2024-04-20 DIAGNOSIS — F419 Anxiety disorder, unspecified: Secondary | ICD-10-CM

## 2024-04-20 MED ORDER — CLONAZEPAM 0.5 MG PO TABS
0.5000 mg | ORAL_TABLET | Freq: Three times a day (TID) | ORAL | 1 refills | Status: DC | PRN
  Filled 2024-04-20 – 2024-04-27 (×2): qty 90, 30d supply, fill #0

## 2024-04-20 NOTE — Progress Notes (Signed)
 Requesting update klonopin  script for insurance.  Brooke [name], MD

## 2024-04-21 NOTE — Progress Notes (Signed)
 Complex Care Management Note Care Guide Note  04/21/2024 Name: Brooke [name] MRN: [id number] DOB: August 10, 1945   Complex Care Management Outreach Attempts: An unsuccessful telephone outreach was attempted today to offer the patient information about available complex care management services.  Follow Up Plan:  No further outreach attempts will be made at this time. We have been unable to contact the patient to offer or enroll patient in complex care management services.  Encounter Outcome:  No Answer  Dreama [facility]  [facility], [facility] VBCI Assistant Direct Dial: [phone]  Fax: [phone]

## 2024-04-26 ENCOUNTER — Other Ambulatory Visit: Payer: Self-pay

## 2024-04-26 ENCOUNTER — Other Ambulatory Visit (HOSPITAL_COMMUNITY): Payer: Self-pay

## 2024-04-26 ENCOUNTER — Other Ambulatory Visit: Payer: Self-pay | Admitting: Pediatrics

## 2024-04-27 ENCOUNTER — Other Ambulatory Visit (HOSPITAL_COMMUNITY): Payer: Self-pay

## 2024-04-27 MED ORDER — OMEPRAZOLE 20 MG PO CPDR
20.0000 mg | DELAYED_RELEASE_CAPSULE | Freq: Two times a day (BID) | ORAL | 1 refills | Status: DC
  Filled 2024-05-04 – 2024-05-09 (×5): qty 60, 30d supply, fill #0
  Filled 2024-06-01: qty 60, 30d supply, fill #1
  Filled 2024-06-26: qty 60, 30d supply, fill #2
  Filled 2024-08-01: qty 60, 30d supply, fill #3

## 2024-04-27 NOTE — Telephone Encounter (Signed)
 Requested Prescriptions  Pending Prescriptions Disp Refills   omeprazole  (PRILOSEC) 20 MG capsule 180 capsule 1    Sig: Take 1 capsule (20 mg total) by mouth 2 (two) times daily before a meal     Gastroenterology: Proton Pump Inhibitors Passed - 04/27/2024  4:25 PM      Passed - Valid encounter within last 12 months    Recent Outpatient Visits           3 weeks ago Cognitive impairment   [place] [facility] Herold Hadassah SQUIBB, MD   1 month ago Polypharmacy   [place] [facility] Herold Hadassah SQUIBB, MD   4 months ago Major depressive disorder with single episode, in full remission   [place] [facility] Herold Hadassah SQUIBB, MD   5 months ago Major depressive disorder with single episode, in full remission   [facility] Herold Hadassah SQUIBB, MD   6 months ago Hospital discharge follow-up    [facility] Herold Hadassah SQUIBB, MD

## 2024-04-28 ENCOUNTER — Other Ambulatory Visit: Payer: Self-pay

## 2024-05-03 ENCOUNTER — Other Ambulatory Visit: Payer: Self-pay

## 2024-05-03 ENCOUNTER — Other Ambulatory Visit (HOSPITAL_COMMUNITY): Payer: Self-pay

## 2024-05-03 ENCOUNTER — Telehealth: Admitting: Pediatrics

## 2024-05-03 DIAGNOSIS — R4189 Other symptoms and signs involving cognitive functions and awareness: Secondary | ICD-10-CM | POA: Diagnosis not present

## 2024-05-03 DIAGNOSIS — F325 Major depressive disorder, single episode, in full remission: Secondary | ICD-10-CM

## 2024-05-03 DIAGNOSIS — F419 Anxiety disorder, unspecified: Secondary | ICD-10-CM

## 2024-05-03 MED ORDER — CLONAZEPAM 0.5 MG PO TABS
0.5000 mg | ORAL_TABLET | Freq: Two times a day (BID) | ORAL | 1 refills | Status: DC | PRN
Start: 2024-05-28 — End: 2024-06-22
  Filled 2024-05-03 – 2024-06-13 (×2): qty 60, 30d supply, fill #0

## 2024-05-03 NOTE — Progress Notes (Signed)
 Telehealth Visit  I connected with  Brooke [name] on 05/09/24 by a video enabled telemedicine application and verified that I am speaking with the correct person using two identifiers.   I discussed the limitations of evaluation and management by telemedicine. The patient expressed understanding and agreed to proceed.  Subjective:    Patient ID: Brooke [name], female    DOB: 1946-02-26, 78 y.o.   MRN: [id number]  HPI: Brooke [name] is a 78 y.o. female  Chief Complaint  Patient presents with   Depression    Pt daughter informed me that she cut back on patient klonpin and she seems to be better the patient is unaware of this change     Discussed the use of AI scribe software for clinical note transcription with the patient, who gave verbal consent to proceed.  Daughter provided the bulk of the history.  History of Present Illness   Brooke [name] is a 78 year old female who presents for medication management and follow-up on referrals.  She is currently taking one 0.5 mg dose of an unspecified medication in the morning. Her daughter notes that she seems 'a little better' with this regimen. Gabapentin  was being administered at a dose of 100 mg twice daily.  There have been issues with referrals to a memory clinic, as the initial referral was denied and no follow-up communication was received. Her daughter mentions that the referral was supposed to be sent through a memory specialist, but it was canceled about a month ago without further notice.  Her mood has been 'so-so' and 'off and on,' but overall stable. She mentions that she is eating well, possibly 'too much.'      Relevant past medical, surgical, family and social history reviewed and updated as indicated. Interim medical history since our last visit reviewed. Allergies and medications reviewed and updated.  ROS per HPI unless specifically indicated above     Objective:    There were no vitals taken for this  visit.  Wt Readings from Last 3 Encounters:  03/31/24 112 lb 6.4 oz (51 kg)  11/11/23 112 lb (50.8 kg)  10/28/23 107 lb (48.5 kg)     Physical Exam Constitutional:      General: She is not in acute distress.    Appearance: Normal appearance.  Neurological:     General: No focal deficit present.     Mental Status: She is alert. Mental status is at baseline.      LIMITED EXAM GIVEN VIDEO VISIT     Assessment & Plan:  Assessment & Plan   Cognitive impairment Assessment & Plan: Ongoing concerns. Cognitive impairment. Previous referral to memory clinic unsuccessful, will help troubleshoot with referral team. Of note, patient has reduced klonopin  0.5mg  to BID which seems to have helped her energy and cognition some. Recommend she discontinue gabapentin  as well. Neurology evaluation pending.   Orders: -     Ambulatory referral to Neurology  Major depressive disorder with single episode, in full remission Anxiety Assessment & Plan: Doing better on reduced dosed klonopin . CTM.   Orders: -     clonazePAM ; Take 1 tablet (0.5 mg total) by mouth 2 (two) times daily as needed for anxiety.  Dispense: 60 tablet; Refill: 1   Follow up plan: 1 mo  [name], MD   This visit was completed via video visit through MyChart due to the restrictions of the COVID-19 pandemic. All issues as above were discussed and addressed. Physical  exam was done as above through visual confirmation on video through MyChart. If it was felt that the patient should be evaluated in the office, they were directed there. The patient verbally consented to this visit.  Location of the patient: home Location of the provider: work Those involved with this call:  Provider: Hadassah Nett, MD CMA: Cena Maffucci, CMA Time spent on call: 15 minutes with patient face to face via video conference. More than 50% of this time was spent in counseling and coordination of care. 15 minutes total spent in review of patient's  record and preparation of their chart. Total time spent on this encounter: 30 minutes.

## 2024-05-04 ENCOUNTER — Other Ambulatory Visit: Payer: Self-pay

## 2024-05-04 ENCOUNTER — Other Ambulatory Visit: Payer: Self-pay | Admitting: Pediatrics

## 2024-05-04 ENCOUNTER — Other Ambulatory Visit (HOSPITAL_COMMUNITY): Payer: Self-pay

## 2024-05-04 DIAGNOSIS — F419 Anxiety disorder, unspecified: Secondary | ICD-10-CM

## 2024-05-05 ENCOUNTER — Other Ambulatory Visit: Payer: Self-pay

## 2024-05-05 MED ORDER — GABAPENTIN 100 MG PO CAPS
100.0000 mg | ORAL_CAPSULE | Freq: Two times a day (BID) | ORAL | 1 refills | Status: DC
  Filled 2024-05-05 – 2024-05-08 (×2): qty 60, 30d supply, fill #0

## 2024-05-05 NOTE — Telephone Encounter (Signed)
 Requested Prescriptions  Pending Prescriptions Disp Refills   gabapentin  (NEURONTIN ) 100 MG capsule 180 capsule 1    Sig: Take 1 capsule (100 mg total) by mouth 2 (two) times daily. In the morning and at bedtime.     Neurology: Anticonvulsants - gabapentin  Passed - 05/05/2024  2:31 PM      Passed - Cr in normal range and within 360 days    Creatinine, Ser  Date Value Ref Range Status  03/31/2024 0.83 0.57 - 1.00 mg/dL Final         Passed - Completed PHQ-2 or PHQ-9 in the last 360 days      Passed - Valid encounter within last 12 months    Recent Outpatient Visits           2 days ago Cognitive impairment   [place] [facility] Herold Hadassah SQUIBB, MD   1 month ago Cognitive impairment   [place] [facility] Herold Hadassah SQUIBB, MD   1 month ago Polypharmacy   [place] [facility] Herold Hadassah SQUIBB, MD   4 months ago Major depressive disorder with single episode, in full remission   [place] [facility] Herold Hadassah SQUIBB, MD   5 months ago Major depressive disorder with single episode, in full remission   [place] [facility] Herold Hadassah SQUIBB, MD

## 2024-05-08 ENCOUNTER — Other Ambulatory Visit (HOSPITAL_COMMUNITY): Payer: Self-pay

## 2024-05-08 ENCOUNTER — Other Ambulatory Visit: Payer: Self-pay

## 2024-05-09 ENCOUNTER — Other Ambulatory Visit (HOSPITAL_COMMUNITY): Payer: Self-pay

## 2024-05-09 ENCOUNTER — Other Ambulatory Visit: Payer: Self-pay

## 2024-05-09 ENCOUNTER — Encounter: Payer: Self-pay | Admitting: Pediatrics

## 2024-05-09 NOTE — Assessment & Plan Note (Signed)
 Doing better on reduced dosed klonopin . CTM.

## 2024-05-09 NOTE — Assessment & Plan Note (Addendum)
 Ongoing concerns. Cognitive impairment. Previous referral to memory clinic unsuccessful, will help troubleshoot with referral team. Of note, patient has reduced klonopin  0.5mg  to BID which seems to have helped her energy and cognition some. Recommend she discontinue gabapentin  as well. Neurology evaluation pending.

## 2024-05-10 ENCOUNTER — Other Ambulatory Visit: Payer: Self-pay

## 2024-05-10 ENCOUNTER — Emergency Department

## 2024-05-10 ENCOUNTER — Inpatient Hospital Stay
Admission: EM | Admit: 2024-05-10 | Discharge: 2024-05-12 | DRG: 640 | Disposition: A | Discharge status: Home Health | Attending: Student in an Organized Health Care Education/Training Program | Admitting: Student in an Organized Health Care Education/Training Program

## 2024-05-10 DIAGNOSIS — R4189 Other symptoms and signs involving cognitive functions and awareness: Secondary | ICD-10-CM | POA: Diagnosis present

## 2024-05-10 DIAGNOSIS — G9341 Metabolic encephalopathy: Secondary | ICD-10-CM | POA: Diagnosis present

## 2024-05-10 DIAGNOSIS — Z7982 Long term (current) use of aspirin: Secondary | ICD-10-CM

## 2024-05-10 DIAGNOSIS — R112 Nausea with vomiting, unspecified: Secondary | ICD-10-CM | POA: Diagnosis present

## 2024-05-10 DIAGNOSIS — J45909 Unspecified asthma, uncomplicated: Secondary | ICD-10-CM | POA: Diagnosis present

## 2024-05-10 DIAGNOSIS — Z90711 Acquired absence of uterus with remaining cervical stump: Secondary | ICD-10-CM

## 2024-05-10 DIAGNOSIS — K5900 Constipation, unspecified: Secondary | ICD-10-CM | POA: Diagnosis not present

## 2024-05-10 DIAGNOSIS — R109 Unspecified abdominal pain: Secondary | ICD-10-CM | POA: Diagnosis not present

## 2024-05-10 DIAGNOSIS — K5641 Fecal impaction: Secondary | ICD-10-CM | POA: Diagnosis not present

## 2024-05-10 DIAGNOSIS — H9041 Sensorineural hearing loss, unilateral, right ear, with unrestricted hearing on the contralateral side: Secondary | ICD-10-CM | POA: Diagnosis present

## 2024-05-10 DIAGNOSIS — R4182 Altered mental status, unspecified: Secondary | ICD-10-CM

## 2024-05-10 DIAGNOSIS — Z882 Allergy status to sulfonamides status: Secondary | ICD-10-CM | POA: Diagnosis not present

## 2024-05-10 DIAGNOSIS — Z888 Allergy status to other drugs, medicaments and biological substances status: Secondary | ICD-10-CM

## 2024-05-10 DIAGNOSIS — E860 Dehydration: Secondary | ICD-10-CM | POA: Diagnosis present

## 2024-05-10 DIAGNOSIS — I10 Essential (primary) hypertension: Secondary | ICD-10-CM | POA: Diagnosis not present

## 2024-05-10 DIAGNOSIS — E785 Hyperlipidemia, unspecified: Secondary | ICD-10-CM | POA: Diagnosis not present

## 2024-05-10 DIAGNOSIS — Z79899 Other long term (current) drug therapy: Secondary | ICD-10-CM | POA: Diagnosis not present

## 2024-05-10 DIAGNOSIS — R1084 Generalized abdominal pain: Secondary | ICD-10-CM | POA: Diagnosis not present

## 2024-05-10 DIAGNOSIS — Z800 Family history of malignant neoplasm of digestive organs: Secondary | ICD-10-CM | POA: Diagnosis not present

## 2024-05-10 DIAGNOSIS — Z743 Need for continuous supervision: Secondary | ICD-10-CM | POA: Diagnosis not present

## 2024-05-10 DIAGNOSIS — F419 Anxiety disorder, unspecified: Secondary | ICD-10-CM | POA: Diagnosis present

## 2024-05-10 DIAGNOSIS — Z8249 Family history of ischemic heart disease and other diseases of the circulatory system: Secondary | ICD-10-CM | POA: Diagnosis not present

## 2024-05-10 DIAGNOSIS — R197 Diarrhea, unspecified: Secondary | ICD-10-CM | POA: Diagnosis present

## 2024-05-10 DIAGNOSIS — R110 Nausea: Secondary | ICD-10-CM

## 2024-05-10 DIAGNOSIS — F325 Major depressive disorder, single episode, in full remission: Secondary | ICD-10-CM | POA: Diagnosis present

## 2024-05-10 DIAGNOSIS — E871 Hypo-osmolality and hyponatremia: Secondary | ICD-10-CM | POA: Diagnosis not present

## 2024-05-10 DIAGNOSIS — K5730 Diverticulosis of large intestine without perforation or abscess without bleeding: Secondary | ICD-10-CM | POA: Diagnosis not present

## 2024-05-10 DIAGNOSIS — A084 Viral intestinal infection, unspecified: Secondary | ICD-10-CM

## 2024-05-10 DIAGNOSIS — Z1152 Encounter for screening for COVID-19: Secondary | ICD-10-CM

## 2024-05-10 LAB — URINALYSIS, ROUTINE W REFLEX MICROSCOPIC
Bilirubin Urine: NEGATIVE
Glucose, UA: NEGATIVE mg/dL
Hgb urine dipstick: NEGATIVE
Ketones, ur: 5 mg/dL — AB
Leukocytes,Ua: NEGATIVE
Nitrite: NEGATIVE
Protein, ur: NEGATIVE mg/dL
Specific Gravity, Urine: 1.032 — ABNORMAL HIGH (ref 1.005–1.030)
pH: 6 (ref 5.0–8.0)

## 2024-05-10 LAB — COMPREHENSIVE METABOLIC PANEL WITH GFR
ALT: 15 U/L (ref 0–44)
AST: 26 U/L (ref 15–41)
Albumin: 3.9 g/dL (ref 3.5–5.0)
Alkaline Phosphatase: 58 U/L (ref 38–126)
Anion gap: 11 (ref 5–15)
BUN: 14 mg/dL (ref 8–23)
CO2: 23 mmol/L (ref 22–32)
Calcium: 8.9 mg/dL (ref 8.9–10.3)
Chloride: 94 mmol/L — ABNORMAL LOW (ref 98–111)
Creatinine, Ser: 0.83 mg/dL (ref 0.44–1.00)
GFR, Estimated: >60 mL/min (ref >60)
Glucose, Bld: 94 mg/dL (ref 70–99)
Potassium: 4.1 mmol/L (ref 3.5–5.1)
Sodium: 128 mmol/L — ABNORMAL LOW (ref 135–145)
Total Bilirubin: 1.1 mg/dL (ref 0.0–1.2)
Total Protein: 7.3 g/dL (ref 6.5–8.1)

## 2024-05-10 LAB — CBC
HCT: 37.8 % (ref 36.0–46.0)
Hemoglobin: 12.7 g/dL (ref 12.0–15.0)
MCH: 30 pg (ref 26.0–34.0)
MCHC: 33.6 g/dL (ref 30.0–36.0)
MCV: 89.2 fL (ref 80.0–100.0)
Platelets: 241 K/uL (ref 150–400)
RBC: 4.24 MIL/uL (ref 3.87–5.11)
RDW: 12.6 % (ref 11.5–15.5)
WBC: 6.4 K/uL (ref 4.0–10.5)
nRBC: 0 % (ref 0.0–0.2)

## 2024-05-10 LAB — TROPONIN I (HIGH SENSITIVITY): Troponin I (High Sensitivity): 5 ng/L (ref <18)

## 2024-05-10 LAB — RESP PANEL BY RT-PCR (RSV, FLU A&B, COVID)  RVPGX2
Influenza A by PCR: NEGATIVE
Influenza B by PCR: NEGATIVE
Resp Syncytial Virus by PCR: NEGATIVE
SARS Coronavirus 2 by RT PCR: NEGATIVE

## 2024-05-10 LAB — TSH: TSH: 1.32 u[IU]/mL (ref 0.350–4.500)

## 2024-05-10 LAB — LIPASE, BLOOD: Lipase: 47 U/L (ref 11–51)

## 2024-05-10 MED ORDER — SODIUM CHLORIDE 0.9 % IV SOLN: INTRAVENOUS | Status: AC

## 2024-05-10 MED ORDER — SODIUM CHLORIDE 0.9 % IV BOLUS
1000.0000 mL | Freq: Once | INTRAVENOUS | Status: AC
  Administered 2024-05-10: 1000 mL via INTRAVENOUS

## 2024-05-10 MED ORDER — OXYCODONE HCL 5 MG PO TABS
5.0000 mg | ORAL_TABLET | ORAL | Status: DC | PRN
  Administered 2024-05-11 (×2): 5 mg via ORAL
  Filled 2024-05-10 (×3): qty 1

## 2024-05-10 MED ORDER — MORPHINE SULFATE (PF) 2 MG/ML IV SOLN: 2.0000 mg | INTRAVENOUS | Status: DC | PRN

## 2024-05-10 MED ORDER — IOHEXOL 300 MG/ML  SOLN
100.0000 mL | Freq: Once | INTRAMUSCULAR | Status: AC | PRN
  Administered 2024-05-10: 100 mL via INTRAVENOUS

## 2024-05-10 MED ORDER — FLUOXETINE HCL 20 MG PO CAPS
40.0000 mg | ORAL_CAPSULE | Freq: Every morning | ORAL | Status: DC
  Administered 2024-05-11 – 2024-05-12 (×2): 40 mg via ORAL
  Filled 2024-05-10 (×3): qty 2

## 2024-05-10 MED ORDER — ACETAMINOPHEN 500 MG PO TABS
1000.0000 mg | ORAL_TABLET | Freq: Once | ORAL | Status: AC
  Administered 2024-05-10: 1000 mg via ORAL
  Filled 2024-05-10: qty 2

## 2024-05-10 MED ORDER — LOSARTAN POTASSIUM 25 MG PO TABS
25.0000 mg | ORAL_TABLET | Freq: Every day | ORAL | Status: DC
  Administered 2024-05-11 – 2024-05-12 (×2): 25 mg via ORAL
  Filled 2024-05-10 (×2): qty 1

## 2024-05-10 MED ORDER — PANTOPRAZOLE SODIUM 40 MG IV SOLR
40.0000 mg | Freq: Two times a day (BID) | INTRAVENOUS | Status: DC
  Administered 2024-05-10 – 2024-05-12 (×4): 40 mg via INTRAVENOUS
  Filled 2024-05-10 (×4): qty 10

## 2024-05-10 MED ORDER — ACETAMINOPHEN 325 MG PO TABS: 650.0000 mg | ORAL_TABLET | Freq: Four times a day (QID) | ORAL | Status: DC | PRN

## 2024-05-10 MED ORDER — ASPIRIN 81 MG PO TBEC
81.0000 mg | DELAYED_RELEASE_TABLET | Freq: Every day | ORAL | Status: DC
  Administered 2024-05-10 – 2024-05-12 (×3): 81 mg via ORAL
  Filled 2024-05-10 (×3): qty 1

## 2024-05-10 MED ORDER — ONDANSETRON HCL 4 MG/2ML IJ SOLN
4.0000 mg | Freq: Once | INTRAMUSCULAR | Status: AC
  Administered 2024-05-10: 4 mg via INTRAVENOUS
  Filled 2024-05-10: qty 2

## 2024-05-10 MED ORDER — ONDANSETRON HCL 4 MG/2ML IJ SOLN
4.0000 mg | Freq: Four times a day (QID) | INTRAMUSCULAR | Status: DC | PRN
  Administered 2024-05-11 – 2024-05-12 (×4): 4 mg via INTRAVENOUS
  Filled 2024-05-10 (×4): qty 2

## 2024-05-10 MED ORDER — ONDANSETRON HCL 4 MG PO TABS: 4.0000 mg | ORAL_TABLET | Freq: Four times a day (QID) | ORAL | Status: DC | PRN

## 2024-05-10 MED ORDER — ACETAMINOPHEN 650 MG RE SUPP: 650.0000 mg | Freq: Four times a day (QID) | RECTAL | Status: DC | PRN

## 2024-05-10 MED ORDER — POLYETHYLENE GLYCOL 3350 17 G PO PACK
17.0000 g | PACK | Freq: Every day | ORAL | Status: DC | PRN
Start: 2024-05-10 — End: 2024-05-11

## 2024-05-10 MED ORDER — ATORVASTATIN CALCIUM 20 MG PO TABS
40.0000 mg | ORAL_TABLET | Freq: Every day | ORAL | Status: DC
  Administered 2024-05-11 – 2024-05-12 (×3): 40 mg via ORAL
  Filled 2024-05-10 (×3): qty 2

## 2024-05-10 MED ORDER — CLONAZEPAM 0.5 MG PO TABS
0.5000 mg | ORAL_TABLET | Freq: Two times a day (BID) | ORAL | Status: DC | PRN
  Administered 2024-05-11 (×2): 0.5 mg via ORAL
  Filled 2024-05-10 (×2): qty 1

## 2024-05-10 MED ORDER — ENOXAPARIN SODIUM 40 MG/0.4ML IJ SOSY
40.0000 mg | PREFILLED_SYRINGE | INTRAMUSCULAR | Status: DC
  Administered 2024-05-10 – 2024-05-11 (×2): 40 mg via SUBCUTANEOUS
  Filled 2024-05-10 (×2): qty 0.4

## 2024-05-10 NOTE — ED Triage Notes (Signed)
 First Nurse Note: Patient to ED via [facility] from home for lower abd pain with N/V. Ongoing for a few days. Vs WNL. Aox3- altered to year (baseline).   Cbg 81

## 2024-05-10 NOTE — ED Triage Notes (Signed)
 See first nurse note.

## 2024-05-10 NOTE — H&P (Signed)
 History and Physical    [name] FMW:[id number] DOB: Jun 30, 1946 DOA: 05/10/2024  DOS: the patient was seen and examined on 05/10/2024  PCP: Herold Hadassah SQUIBB, MD   Patient coming from: Home  I have personally briefly reviewed patient's old medical records in [facility]  Chief Complaint: Confusion  HPI: Brooke [name] is a pleasant 78 y.o. female with medical history significant for asthma, HTN, HLD, depression, history of hyponatremia in the past who was brought in to ED at [facility] for confusion/disorientation, nausea, vomiting, abdominal discomfort noticed by the daughter at home.  Patient is alert awake and oriented x 3 at baseline.  She stated that she had burning urination and suprapubic abdominal pain, 5/10 in intensity, intermittent, burning nonradiating, associate with some nausea 1 episode of vomiting.  Patient was getting confused at times per daughter.  She was concerned and brought into the emergency room for evaluation.  Patient denies any fever, chills, chest pain, shortness of breath, palpitations.  ED Course: Upon arrival to the ED, patient is found to be hyponatremic at 128, no leukocytosis, UA pending hospitalist service was consulted for evaluation for admission for acute change in mental status.  Review of Systems:  ROS  All other systems negative except as noted in the HPI.  Past Medical History:  Diagnosis Date   Asthma    Essential hypertension 10/16/2023    Past Surgical History:  Procedure Laterality Date   PARTIAL HYSTERECTOMY     TEMPORAL ARTERY BIOPSY / LIGATION       reports that she has never smoked. She has never used smokeless tobacco. She reports that she does not drink alcohol and does not use drugs.  Allergies  Allergen Reactions   Nystatin  Swelling   Sulfa Antibiotics Rash    Family History  Problem Relation Age of Onset   CAD Father    Colon cancer Father     Prior to Admission medications   Medication Sig Start Date End  Date Taking? Authorizing Provider  aspirin  EC (ASPIRIN  81) 81 MG tablet Take 1 tablet (81 mg total) by mouth daily. Swallow whole, 10/20/23     atorvastatin  (LIPITOR) 40 MG tablet Take 1 tablet (40 mg total) by mouth daily. 04/05/24   Herold Hadassah SQUIBB, MD  clonazePAM  (KLONOPIN ) 0.5 MG tablet Take 1 tablet (0.5 mg total) by mouth 2 (two) times daily as needed for anxiety. 05/28/24   Herold Hadassah SQUIBB, MD  FLUoxetine  (PROZAC ) 40 MG capsule Take 1 capsule (40 mg total) by mouth every morning. 11/09/23   Herold Hadassah SQUIBB, MD  furosemide  (LASIX ) 20 MG tablet Take 1 tablet by mouth as needed (If you gain more than 2lbs overnight)  If you gain more than 5lbs in 1 week, call Dr. Herold or your cardiologist. 11/09/23   Herold Hadassah SQUIBB, MD  gabapentin  (NEURONTIN ) 100 MG capsule Take 1 capsule (100 mg total) by mouth 2 (two) times daily. In the morning and at bedtime. 05/05/24   Herold Hadassah SQUIBB, MD  ipratropium (ATROVENT ) 0.06 % nasal spray Place 1-2 sprays into both nostrils 2 (two) times daily as needed for runny nose. 05/12/23     losartan  (COZAAR ) 25 MG tablet Take 1 tablet (25 mg total) by mouth in the morning and at bedtime. 03/06/24   Herold Hadassah SQUIBB, MD  montelukast  (SINGULAIR ) 10 MG tablet Take 1 tablet (10 mg total) by mouth daily for allergies and wheezing 08/24/23     omeprazole  (PRILOSEC) 20 MG capsule Take  1 capsule (20 mg total) by mouth 2 (two) times daily before a meal 04/27/24   Herold Hadassah SQUIBB, MD  ondansetron  (ZOFRAN ) 4 MG tablet Take 1 tablet (4 mg total) by mouth every 8 (eight) hours as needed for nausea or vomiting. 10/07/23   Herold Hadassah SQUIBB, MD  Vitamin D , Ergocalciferol , (DRISDOL ) 1.25 MG (50000 UNIT) CAPS capsule Take 1 capsule (50,000 Units total) by mouth every 7 (seven) days. 04/06/24   Herold Hadassah SQUIBB, MD    Physical Exam: Vitals:   05/10/24 1110 05/10/24 1250 05/10/24 1300 05/10/24 1400  BP:  (!) 138/93 120/86 138/88  Pulse:  81 77   Resp:   (!) 30 19  Temp:      TempSrc:      SpO2:  95% 98%  100%  Height: 5' 2 (1.575 m)       Physical Exam   Constitutional: Alert, awake, calm, comfortable HEENT: Neck supple Respiratory: Clear to auscultation B/L, no wheezing, no rales.  Cardiovascular: Regular rate and rhythm, no murmurs / rubs / gallops. No extremity edema. 2+ pedal pulses. No carotid bruits.  Abdomen: Soft, no tenderness, Bowel sounds positive.  Musculoskeletal: no clubbing / cyanosis. Good ROM, no contractures. Normal muscle tone.  Skin: no rashes, lesions, ulcers. Neurologic: CN 2-12 grossly intact. Sensation intact, No focal deficit identified Psychiatric: Alert and oriented x 3. Normal mood.    Labs on Admission: I have personally reviewed following labs and imaging studies  CBC: Recent Labs  Lab 05/10/24 1115  WBC 6.4  HGB 12.7  HCT 37.8  MCV 89.2  PLT 241   Basic Metabolic Panel: Recent Labs  Lab 05/10/24 1115  NA 128*  K 4.1  CL 94*  CO2 23  GLUCOSE 94  BUN 14  CREATININE 0.83  CALCIUM  8.9   GFR: CrCl cannot be calculated (Unknown ideal weight.). Liver Function Tests: Recent Labs  Lab 05/10/24 1115  AST 26  ALT 15  ALKPHOS 58  BILITOT 1.1  PROT 7.3  ALBUMIN 3.9   Recent Labs  Lab 05/10/24 1115  LIPASE 47   No results for input(s): AMMONIA in the last 168 hours. Coagulation Profile: No results for input(s): INR, PROTIME in the last 168 hours. Cardiac Enzymes: Recent Labs  Lab 05/10/24 1400  TROPONINIHS 5   BNP (last 3 results) Recent Labs    10/16/23 1852 10/28/23 1153  BNP 481.3* 30.4   HbA1C: No results for input(s): HGBA1C in the last 72 hours. CBG: No results for input(s): GLUCAP in the last 168 hours. Lipid Profile: No results for input(s): CHOL, HDL, LDLCALC, TRIG, CHOLHDL, LDLDIRECT in the last 72 hours. Thyroid  Function Tests: No results for input(s): TSH, T4TOTAL, FREET4, T3FREE, THYROIDAB in the last 72 hours. Anemia Panel: No results for input(s): VITAMINB12,  FOLATE, FERRITIN, TIBC, IRON , RETICCTPCT in the last 72 hours. Urine analysis:    Component Value Date/Time   COLORURINE AMBER (A) 10/16/2023 1049   APPEARANCEUR CLEAR (A) 10/16/2023 1049   APPEARANCEUR Hazy (A) 10/12/2023 1510   LABSPEC 1.017 10/16/2023 1049   PHURINE 5.0 10/16/2023 1049   GLUCOSEU NEGATIVE 10/16/2023 1049   HGBUR NEGATIVE 10/16/2023 1049   BILIRUBINUR NEGATIVE 10/16/2023 1049   BILIRUBINUR Negative 10/12/2023 1510   KETONESUR 5 (A) 10/16/2023 1049   PROTEINUR NEGATIVE 10/16/2023 1049   NITRITE NEGATIVE 10/16/2023 1049   LEUKOCYTESUR NEGATIVE 10/16/2023 1049    Radiological Exams on Admission: I have personally reviewed images CT ABDOMEN PELVIS W CONTRAST Result Date: 05/10/2024 EXAM:  CT ABDOMEN AND PELVIS WITH CONTRAST 05/10/2024 01:40:57 PM TECHNIQUE: CT of the abdomen and pelvis was performed with the administration of 100 mL iohexol  300 mg/mL solution. Multiplanar reformatted images are provided for review. Automated exposure control, iterative reconstruction, and/or weight-based adjustment of the mA/kV was utilized to reduce the radiation dose to as low as reasonably achievable. COMPARISON: None available. CLINICAL HISTORY: Abdominal pain, acute, nonlocalized. FINDINGS: LOWER CHEST: Chronic elevation of the left hemidiaphragm. Right lung base clear. LIVER: The liver is unremarkable. GALLBLADDER AND BILE DUCTS: Gallbladder is unremarkable. No biliary ductal dilatation. SPLEEN: No acute abnormality. PANCREAS: No acute abnormality. ADRENAL GLANDS: No acute abnormality. KIDNEYS, URETERS AND BLADDER: Within the right kidney, a simple fluid attenuation cortical lesion measures 11 mm on postcontrast enhancement on contrast series. According to the Bosniak classification system of renal cystic masses, the finding is consistent with Bosniak 1 and the recommendation is no further work-up is required. No stones in the kidneys or ureters. No hydronephrosis. No perinephric or  periureteral stranding. Urinary bladder is unremarkable. GI AND BOWEL: The stomach is partially imaged due to elevation of the left hemidiaphragm. Small bowel is normal. Pedicles not identified. No secondary signs of appendicitis. Stool in the cecum and ascending colon. Stool throughout the transverse and descending colon. Diverticula of the sigmoid colon without acute inflammation. Stool and gas within the rectum. There is no bowel obstruction. PERITONEUM AND RETROPERITONEUM: No ascites. No free air. VASCULATURE: Aorta is normal in caliber. LYMPH NODES: No lymphadenopathy. REPRODUCTIVE ORGANS: Post hysterectomy. BONES AND SOFT TISSUES: Degenerative spurring of the spine. No acute osseous abnormality. No focal soft tissue abnormality. IMPRESSION: 1. Moderate Colonic stool burden without obstruction; sigmoid diverticulosis without acute diverticulitis. 2. Chronic elevation of the left hemidiaphragm. Electronically signed by: Norleen Boxer MD 05/10/2024 02:16 PM EDT RP Workstation: [id number]    EKG: N/A    Assessment/Plan Active Problems:   Sensorineural hearing loss (SNHL) of right ear with unrestricted hearing of left ear   Anxiety   Major depressive disorder with single episode, in full remission   Cognitive impairment   Hyponatremia    Assessment and Plan: 78 year old female with history of sensorineural hearing loss, anxiety/depression, cognitive impairment, HTN, HLD who came in to ED for confusion, nausea, vomiting, abdominal pain and dysuria.  1.  Acute change in mental status in the setting of cognitive impairment - Likely due to hyponatremia at 128, questionable urinary tract infection, no fever no leukocytosis - Patient has burning urination and abdominal discomfort in the suprapubic area - Will check urine analysis and add antibiotic if needed - She has a history of mild cognitive impairment and has a sodium of 128 that could have contributed as well. - I will continue her supportive  care  2.  Hyponatremia - She appears to be dehydrated - Could be related to fluoxetine , nausea vomiting diarrhea due to GI losses - Will give her gentle hydration with normal saline - Continue to monitor symptoms.  3.  Nausea/vomiting/abdominal pain - Not sure whether this was related to acute gastroenteritis - Will give her antinausea medication, IV fluid and Protonix  hopefully that will help  4.  HTN/HLD - Will resume home dose of the medications    DVT prophylaxis: Lovenox  Code Status: Full Code Family Communication: Daughter at bedside Disposition Plan: Home Consults called: None Admission status: Observation, Med-Surg   Nena Rebel, MD [facility] 05/10/2024, 3:30 PM

## 2024-05-10 NOTE — ED Provider Notes (Signed)
 [facility] Provider Note    Event Date/Time   First MD Initiated Contact with Patient 05/10/24 1247     (approximate)   History   Abdominal Pain   HPI  Brooke [name] is a 78 y.o. female past medical history significant for cognitive impairment, depression, polypharmacy, who presents to the emergency department with abdominal pain.  Patient and endorses abdominal pain with nausea and vomiting over the past couple of days.  Daughter at bedside states that she has been more confused and altered over the past couple of days.  Patient has recently been followed by primary care physician and they have discontinued multiple medications including her clonazepam  and given that they thought that this may be causing her memory issues.  Denies any dysuria, urinary urgency or frequency.  No falls or trauma     Physical Exam   Triage Vital Signs: ED Triage Vitals  Encounter Vitals Group     BP 05/10/24 1109 (!) 134/94     Girls Systolic BP Percentile --      Girls Diastolic BP Percentile --      Boys Systolic BP Percentile --      Boys Diastolic BP Percentile --      Pulse Rate 05/10/24 1109 77     Resp 05/10/24 1109 18     Temp 05/10/24 1109 98.2 F (36.8 C)     Temp Source 05/10/24 1109 Oral     SpO2 05/10/24 1109 98 %     Weight --      Height 05/10/24 1110 5' 2 (1.575 m)     Head Circumference --      Peak Flow --      Pain Score 05/10/24 1110 9     Pain Loc --      Pain Education --      Exclude from Growth Chart --     Most recent vital signs: Vitals:   05/10/24 1300 05/10/24 1400  BP: 120/86 138/88  Pulse: 77   Resp: (!) 30 19  Temp:    SpO2: 98% 100%    Physical Exam Constitutional:      Appearance: She is well-developed.  HENT:     Head: Atraumatic.  Eyes:     Conjunctiva/sclera: Conjunctivae normal.  Cardiovascular:     Rate and Rhythm: Regular rhythm.  Pulmonary:     Effort: Pulmonary effort is normal. No respiratory  distress.  Abdominal:     General: There is no distension.     Tenderness: There is generalized abdominal tenderness. There is no right CVA tenderness or left CVA tenderness.  Musculoskeletal:        General: Normal range of motion.     Cervical back: Normal range of motion.  Skin:    General: Skin is warm.     Capillary Refill: Capillary refill takes less than 2 seconds.  Neurological:     Mental Status: She is alert. Mental status is at baseline.     IMPRESSION / MDM / ASSESSMENT AND PLAN / ED COURSE  I reviewed the triage vital signs and the nursing notes.  Differential diagnosis including diverticulitis, viral gastroenteritis, pancreatitis, acute cholecystitis, small bowel obstruction, constipation, dehydration, urinary tract infection, ACS, intracranial hemorrhage, polypharmacy, viral illness including COVID/influenza   No tachycardic or bradycardic dysrhythmias while on cardiac telemetry.  RADIOLOGY CT scan showed moderate stool burden diverticulosis but no signs of acute diverticulitis.  Chronic left hemidiaphragm elevation.  My evaluation of CT scan  of the head with no signs of acute intracranial hemorrhage or infarction.  Read currently pending.  LABS (all labs ordered are listed, but only abnormal results are displayed) Labs interpreted as -    Labs Reviewed  COMPREHENSIVE METABOLIC PANEL WITH GFR - Abnormal; Notable for the following components:      Result Value   Sodium 128 (*)    Chloride 94 (*)    All other components within normal limits  RESP PANEL BY RT-PCR (RSV, FLU A&B, COVID)  RVPGX2  LIPASE, BLOOD  CBC  URINALYSIS, ROUTINE W REFLEX MICROSCOPIC  CBC  CREATININE, SERUM  TSH  TROPONIN I (HIGH SENSITIVITY)     MDM    Patient with hyponatremia with a sodium of 128 which has decreased when compared to prior.  Creatinine at baseline with no significant abnormality of her potassium.  Troponin is negative have low suspicion for ACS.  COVID and  influenza testing are negative.  Lipase normal have a low suspicion for acute pancreatitis.  Likely with ongoing nausea and dehydration secondary to severe constipation likely in the setting of multiple medication changes.  Given her altered mental status and hyponatremia will discuss with hospitalist team for admission.  Requesting a urine.  Will do an In-N-Out urine and they will follow-up.  If any signs of infection they will start some antibiotics.   PROCEDURES:  Critical Care performed: No  Procedures  Patient's presentation is most consistent with acute presentation with potential threat to life or bodily function.   MEDICATIONS ORDERED IN ED: Medications  sodium chloride  0.9 % bolus 1,000 mL (has no administration in time range)  enoxaparin  (LOVENOX ) injection 40 mg (has no administration in time range)  acetaminophen  (TYLENOL ) tablet 650 mg (has no administration in time range)    Or  acetaminophen  (TYLENOL ) suppository 650 mg (has no administration in time range)  oxyCODONE (Oxy IR/ROXICODONE) immediate release tablet 5 mg (has no administration in time range)  morphine (PF) 2 MG/ML injection 2 mg (has no administration in time range)  polyethylene glycol (MIRALAX  / GLYCOLAX ) packet 17 g (has no administration in time range)  ondansetron  (ZOFRAN ) tablet 4 mg (has no administration in time range)    Or  ondansetron  (ZOFRAN ) injection 4 mg (has no administration in time range)  0.9 %  sodium chloride  infusion (has no administration in time range)  aspirin  EC tablet 81 mg (has no administration in time range)  atorvastatin  (LIPITOR) tablet 40 mg (has no administration in time range)  clonazePAM  (KLONOPIN ) tablet 0.5 mg (has no administration in time range)  FLUoxetine  (PROZAC ) capsule 40 mg (has no administration in time range)  losartan  (COZAAR ) tablet 25 mg (has no administration in time range)  pantoprazole  (PROTONIX ) injection 40 mg (has no administration in time range)   sodium chloride  0.9 % bolus 1,000 mL (0 mLs Intravenous Stopped 05/10/24 1454)  ondansetron  (ZOFRAN ) injection 4 mg (4 mg Intravenous Given 05/10/24 1348)  acetaminophen  (TYLENOL ) tablet 1,000 mg (1,000 mg Oral Given 05/10/24 1348)  iohexol  (OMNIPAQUE ) 300 MG/ML solution 100 mL (100 mLs Intravenous Contrast Given 05/10/24 1334)    FINAL CLINICAL IMPRESSION(S) / ED DIAGNOSES   Final diagnoses:  Generalized abdominal pain  Nausea  Dehydration  Hyponatremia  Altered mental status, unspecified altered mental status type     Rx / DC Orders   ED Discharge Orders     None        Note:  This document was prepared using Dragon voice recognition software and  may include unintentional dictation errors.   Suzanne Kirsch, MD 05/10/24 [phone]

## 2024-05-11 DIAGNOSIS — Z882 Allergy status to sulfonamides status: Secondary | ICD-10-CM | POA: Diagnosis not present

## 2024-05-11 DIAGNOSIS — E860 Dehydration: Secondary | ICD-10-CM | POA: Diagnosis not present

## 2024-05-11 DIAGNOSIS — K5901 Slow transit constipation: Secondary | ICD-10-CM | POA: Diagnosis not present

## 2024-05-11 DIAGNOSIS — Z1152 Encounter for screening for COVID-19: Secondary | ICD-10-CM | POA: Diagnosis not present

## 2024-05-11 DIAGNOSIS — R110 Nausea: Secondary | ICD-10-CM | POA: Diagnosis not present

## 2024-05-11 DIAGNOSIS — Z7982 Long term (current) use of aspirin: Secondary | ICD-10-CM | POA: Diagnosis not present

## 2024-05-11 DIAGNOSIS — R197 Diarrhea, unspecified: Secondary | ICD-10-CM | POA: Diagnosis present

## 2024-05-11 DIAGNOSIS — I10 Essential (primary) hypertension: Secondary | ICD-10-CM | POA: Diagnosis present

## 2024-05-11 DIAGNOSIS — E871 Hypo-osmolality and hyponatremia: Secondary | ICD-10-CM | POA: Diagnosis not present

## 2024-05-11 DIAGNOSIS — F419 Anxiety disorder, unspecified: Secondary | ICD-10-CM | POA: Diagnosis present

## 2024-05-11 DIAGNOSIS — R4189 Other symptoms and signs involving cognitive functions and awareness: Secondary | ICD-10-CM | POA: Diagnosis present

## 2024-05-11 DIAGNOSIS — R112 Nausea with vomiting, unspecified: Secondary | ICD-10-CM | POA: Diagnosis present

## 2024-05-11 DIAGNOSIS — Z79899 Other long term (current) drug therapy: Secondary | ICD-10-CM | POA: Diagnosis not present

## 2024-05-11 DIAGNOSIS — Z90711 Acquired absence of uterus with remaining cervical stump: Secondary | ICD-10-CM | POA: Diagnosis not present

## 2024-05-11 DIAGNOSIS — Z8249 Family history of ischemic heart disease and other diseases of the circulatory system: Secondary | ICD-10-CM | POA: Diagnosis not present

## 2024-05-11 DIAGNOSIS — J45909 Unspecified asthma, uncomplicated: Secondary | ICD-10-CM | POA: Diagnosis present

## 2024-05-11 DIAGNOSIS — H9041 Sensorineural hearing loss, unilateral, right ear, with unrestricted hearing on the contralateral side: Secondary | ICD-10-CM | POA: Diagnosis present

## 2024-05-11 DIAGNOSIS — F325 Major depressive disorder, single episode, in full remission: Secondary | ICD-10-CM | POA: Diagnosis present

## 2024-05-11 DIAGNOSIS — G9341 Metabolic encephalopathy: Secondary | ICD-10-CM | POA: Diagnosis present

## 2024-05-11 DIAGNOSIS — Z800 Family history of malignant neoplasm of digestive organs: Secondary | ICD-10-CM | POA: Diagnosis not present

## 2024-05-11 DIAGNOSIS — Z888 Allergy status to other drugs, medicaments and biological substances status: Secondary | ICD-10-CM | POA: Diagnosis not present

## 2024-05-11 DIAGNOSIS — E785 Hyperlipidemia, unspecified: Secondary | ICD-10-CM | POA: Diagnosis present

## 2024-05-11 DIAGNOSIS — R1084 Generalized abdominal pain: Secondary | ICD-10-CM | POA: Diagnosis not present

## 2024-05-11 DIAGNOSIS — K5900 Constipation, unspecified: Secondary | ICD-10-CM | POA: Diagnosis present

## 2024-05-11 DIAGNOSIS — R4182 Altered mental status, unspecified: Secondary | ICD-10-CM | POA: Diagnosis not present

## 2024-05-11 LAB — PROTIME-INR
INR: 1.2 (ref 0.8–1.2)
Prothrombin Time: 15.5 s — ABNORMAL HIGH (ref 11.4–15.2)

## 2024-05-11 LAB — COMPREHENSIVE METABOLIC PANEL WITH GFR
ALT: 14 U/L (ref 0–44)
AST: 20 U/L (ref 15–41)
Albumin: 3.2 g/dL — ABNORMAL LOW (ref 3.5–5.0)
Alkaline Phosphatase: 50 U/L (ref 38–126)
Anion gap: 9 (ref 5–15)
BUN: 11 mg/dL (ref 8–23)
CO2: 19 mmol/L — ABNORMAL LOW (ref 22–32)
Calcium: 8.3 mg/dL — ABNORMAL LOW (ref 8.9–10.3)
Chloride: 103 mmol/L (ref 98–111)
Creatinine, Ser: 0.83 mg/dL (ref 0.44–1.00)
GFR, Estimated: >60 mL/min (ref >60)
Glucose, Bld: 63 mg/dL — ABNORMAL LOW (ref 70–99)
Potassium: 3.7 mmol/L (ref 3.5–5.1)
Sodium: 131 mmol/L — ABNORMAL LOW (ref 135–145)
Total Bilirubin: 1.1 mg/dL (ref 0.0–1.2)
Total Protein: 6 g/dL — ABNORMAL LOW (ref 6.5–8.1)

## 2024-05-11 LAB — GLUCOSE, CAPILLARY
Glucose-Capillary: 60 mg/dL — ABNORMAL LOW (ref 70–99)
Glucose-Capillary: 66 mg/dL — ABNORMAL LOW (ref 70–99)
Glucose-Capillary: 93 mg/dL (ref 70–99)

## 2024-05-11 LAB — CBC
HCT: 36.3 % (ref 36.0–46.0)
Hemoglobin: 12.1 g/dL (ref 12.0–15.0)
MCH: 30.1 pg (ref 26.0–34.0)
MCHC: 33.3 g/dL (ref 30.0–36.0)
MCV: 90.3 fL (ref 80.0–100.0)
Platelets: 189 K/uL (ref 150–400)
RBC: 4.02 MIL/uL (ref 3.87–5.11)
RDW: 12.5 % (ref 11.5–15.5)
WBC: 5.4 K/uL (ref 4.0–10.5)
nRBC: 0 % (ref 0.0–0.2)

## 2024-05-11 MED ORDER — GLUCOSE 40 % PO GEL
1.0000 | Freq: Once | ORAL | Status: AC
  Administered 2024-05-11: 31 g via ORAL
  Filled 2024-05-11: qty 1

## 2024-05-11 MED ORDER — ORAL CARE MOUTH RINSE: 15.0000 mL | OROMUCOSAL | Status: DC | PRN

## 2024-05-11 MED ORDER — LACTULOSE 10 GM/15ML PO SOLN
10.0000 g | Freq: Every day | ORAL | Status: DC
  Administered 2024-05-11: 10 g via ORAL
  Filled 2024-05-11 (×2): qty 30

## 2024-05-11 MED ORDER — POLYETHYLENE GLYCOL 3350 17 G PO PACK
17.0000 g | PACK | Freq: Two times a day (BID) | ORAL | Status: DC
  Administered 2024-05-11 (×2): 17 g via ORAL
  Filled 2024-05-11 (×3): qty 1

## 2024-05-11 MED ORDER — SMOG ENEMA
960.0000 mL | Freq: Once | RECTAL | Status: DC
  Filled 2024-05-11: qty 960

## 2024-05-11 MED ORDER — INFLUENZA VAC SPLIT HIGH-DOSE 0.5 ML IM SUSY
0.5000 mL | PREFILLED_SYRINGE | INTRAMUSCULAR | Status: DC
  Filled 2024-05-11: qty 0.5

## 2024-05-11 NOTE — Evaluation (Signed)
 Occupational Therapy Evaluation Patient Details Name: Brooke [name] MRN: [id number] DOB: April 12, 1946 Today's Date: 05/11/2024   History of Present Illness   Pt is a 78 year old female came in to ED for confusion, nausea, vomiting, abdominal pain and dysuria.    PMH significant for  asthma, HTN, HLD, depression, history of hyponatremia, cognitive impairment     Clinical Impressions Chart reviewed to date, pt greeted semi supine in bed, alert, oriented to self, place, and grossly to situation. She reports she lives with her daughter, amb with a SPC (limited outside of the house) and performs dressing, grooming, feeding,toileting with MOD I, requires assist for bathing. 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 Pt presents with deficits in strength, endurance, activity tolerance, balance, cognition, affecting safe and optimal ADL completion. Pt performs bed mobility with MIN A, STS with HHA with MIN A, CGA with RW, short amb transfer with RW with CGA-MIN A. UB/LB dressing simulated with MIN A. SET UP for feeding tasks. Pt is performing ADL/functional mobility below PLOF, will benefit from acute OT to address functional deficits and to facilitate optimal ADL/functional mobility performance.     If plan is discharge home, recommend the following:   A little help with walking and/or transfers;A little help with bathing/dressing/bathroom;Supervision due to cognitive status     Functional Status Assessment   Patient has had a recent decline in their functional status and demonstrates the ability to make significant improvements in function in a reasonable and predictable amount of time.     Equipment Recommendations   Tub/shower bench, 2WW     Recommendations for Other Services         Precautions/Restrictions   Precautions Precautions: Fall Recall of Precautions/Restrictions: Impaired Restrictions Weight Bearing Restrictions Per Provider Order: No     Mobility Bed  Mobility Overal bed mobility: Needs Assistance Bed Mobility: Supine to Sit     Supine to sit: Min assist, HOB elevated, Used rails          Transfers Overall transfer level: Needs assistance Equipment used: Rolling walker (2 wheels) Transfers: Sit to/from Stand Sit to Stand: Contact guard assist, Min assist           General transfer comment: CGA with RW, MIN A via HHA      Balance Overall balance assessment: Needs assistance Sitting-balance support: Feet supported Sitting balance-Leahy Scale: Fair     Standing balance support: Bilateral upper extremity supported, During functional activity, Reliant on assistive device for balance Standing balance-Leahy Scale: Fair                             ADL either performed or assessed with clinical judgement   ADL Overall ADL's : Needs assistance/impaired Eating/Feeding: Set up;Sitting   Grooming: Standing;Supervision/safety           Upper Body Dressing : Minimal assistance;Sitting   Lower Body Dressing: Minimal assistance;Sitting/lateral leans   Toilet Transfer: CGA-Minimal assistance;Rolling walker (2 wheels);Cueing for safety;Cueing for sequencing Toilet Transfer Details (indicate cue type and reason): short amb transfer to bedside chair, attempted via HHA with pt requiring MOD A                 Vision Baseline Vision/History: 1 Wears glasses Patient Visual Report: No change from baseline       Perception         Praxis         Pertinent Vitals/Pain Pain  Assessment Pain Assessment: Faces Faces Pain Scale: Hurts even more Pain Location: stomach, sacrum Pain Descriptors / Indicators: Discomfort Pain Intervention(s): Repositioned, Monitored during session     Extremity/Trunk Assessment Upper Extremity Assessment Upper Extremity Assessment: Generalized weakness   Lower Extremity Assessment Lower Extremity Assessment: Generalized weakness       Communication  Communication Communication: Impaired Factors Affecting Communication: Hearing impaired   Cognition Arousal: Alert Behavior During Therapy: Flat affect Cognition: History of cognitive impairments, No family/caregiver present to determine baseline, Cognition impaired   Orientation impairments: Time (per chart this is baseline) Awareness: Online awareness impaired Memory impairment (select all impairments): Declarative long-term memory Attention impairment (select first level of impairment): Sustained attention Executive functioning impairment (select all impairments): Reasoning, Problem solving                   Following commands: Impaired Following commands impaired: Follows one step commands with increased time     Cueing  General Comments   Cueing Techniques: Verbal cues;Tactile cues;Visual cues  vss throughout   Exercises Other Exercises Other Exercises: edu re role of OT, role of rehab, discharge recommendations   Shoulder Instructions      Home Living Family/patient expects to be discharged to:: Private residence Living Arrangements: Children Available Help at Discharge: Family;Available 24 hours/day Type of Home: House Home Access: Stairs to enter [employer] of Steps:  a few   Home Layout: One level     Bathroom Shower/Tub: Tub/shower unit         Home Equipment: Cane - single point;BSC/3in1          Prior Functioning/Environment Prior Level of Function : Needs assist;Patient poor historian/Family not available             Mobility Comments: pt reports use of SPC in the house, rarely goes into community for amb ADLs Comments: Pt reports she performs dressing, feeding, grooming, toileting with MOD I, reports increased difficulties with transfer into shower so has been doing sponge baths    OT Problem List: Decreased strength;Decreased range of motion;Decreased activity tolerance;Decreased safety awareness;Impaired balance  (sitting and/or standing);Decreased knowledge of use of DME or AE;Decreased knowledge of precautions   OT Treatment/Interventions: Self-care/ADL training;Therapeutic exercise;Balance training;Therapeutic activities;Energy conservation;DME and/or AE instruction;Patient/family education      OT Goals(Current goals can be found in the care plan section)   Acute Rehab OT Goals Patient Stated Goal: go home OT Goal Formulation: With patient Time For Goal Achievement: 05/25/24 Potential to Achieve Goals: Good   OT Frequency:  Min 2X/week    Co-evaluation              AM-PAC OT 6 Clicks Daily Activity     Outcome Measure Help from another person eating meals?: None Help from another person taking care of personal grooming?: None Help from another person toileting, which includes using toliet, bedpan, or urinal?: A Little Help from another person bathing (including washing, rinsing, drying)?: A Little Help from another person to put on and taking off regular upper body clothing?: A Little Help from another person to put on and taking off regular lower body clothing?: A Little 6 Click Score: 20   End of Session Equipment Utilized During Treatment: Rolling walker (2 wheels) Nurse Communication: Mobility status  Activity Tolerance: Patient tolerated treatment well Patient left: in chair;with call bell/phone within reach;with chair alarm set  OT Visit Diagnosis: Other abnormalities of gait and mobility (R26.89);Muscle weakness (generalized) (M62.81)  Time: 9078-9062 OT Time Calculation (min): 16 min Charges:  OT General Charges $OT Visit: 1 Visit OT Evaluation $OT Eval Moderate Complexity: 1 Mod  Therisa Sheffield, OTD OTR/L  05/11/24, 10:01 AM

## 2024-05-11 NOTE — Plan of Care (Signed)
  Problem: Education: Goal: Knowledge of General Education information will improve Description: Including pain rating scale, medication(s)/side effects and non-pharmacologic comfort measures Outcome: Progressing   Problem: Health Behavior/Discharge Planning: Goal: Ability to manage health-related needs will improve Outcome: Progressing   Problem: Clinical Measurements: Goal: Ability to maintain clinical measurements within normal limits will improve Outcome: Progressing   Problem: Activity: Goal: Risk for activity intolerance will decrease Outcome: Progressing   Problem: Coping: Goal: Level of anxiety will decrease Outcome: Progressing   Problem: Pain Managment: Goal: General experience of comfort will improve and/or be controlled Outcome: Progressing   Problem: Safety: Goal: Ability to remain free from injury will improve Outcome: Progressing   Problem: Skin Integrity: Goal: Risk for impaired skin integrity will decrease Outcome: Progressing

## 2024-05-11 NOTE — Plan of Care (Signed)
  Problem: Education: Goal: Knowledge of General Education information will improve Description: Including pain rating scale, medication(s)/side effects and non-pharmacologic comfort measures Outcome: Progressing   Problem: Clinical Measurements: Goal: Ability to maintain clinical measurements within normal limits will improve Outcome: Progressing   Problem: Activity: Goal: Risk for activity intolerance will decrease Outcome: Progressing   Problem: Coping: Goal: Level of anxiety will decrease Outcome: Progressing   Problem: Elimination: Goal: Will not experience complications related to urinary retention Outcome: Progressing   Problem: Pain Managment: Goal: General experience of comfort will improve and/or be controlled Outcome: Progressing   Problem: Safety: Goal: Ability to remain free from injury will improve Outcome: Progressing   Problem: Skin Integrity: Goal: Risk for impaired skin integrity will decrease Outcome: Progressing

## 2024-05-11 NOTE — Progress Notes (Signed)
 Hypoglycemic Event  CBG: 60 mg/dL  Treatment: 8 oz juice/soda  Symptoms: None  Follow-up CBG Time:  0640  CBG Result: 66 mg/dL  Possible Reasons for Event: Inadequate meal intake  Comments/MD notified:Dr. Delayne Solian notified    Brooke [name]

## 2024-05-11 NOTE — Progress Notes (Signed)
 PROGRESS NOTE GURPREET MARIANI    DOB: July 23, 1946, 78 y.o.  FMW:[id number]    Code Status: Full Code   DOA: 05/10/2024   LOS: 0  Brief hospital course  LASHANE [name] is a 78 y.o. female with a PMH significant for asthma, HTN, HLD, depression, history of hyponatremia in the past who was brought in to ED at [facility] for confusion/disorientation, nausea, vomiting, abdominal discomfort.  ED Course: hyponatremic at 128, no leukocytosis. UA not significant for pyuria  Significant findings included CT head negative for acute changes Abdominal CT showed significant stool burden without obstruction.  They were initially treated with IVF.   05/11/24 -appears to be at mental baseline. Na+ improving with supportive care. Complains of constipation.   Assessment & Plan  Active Problems:   Sensorineural hearing loss (SNHL) of right ear with unrestricted hearing of left ear   Anxiety   Major depressive disorder with single episode, in full remission   Cognitive impairment   Hyponatremia  Acute change in mental status in the setting of cognitive impairment- resolved back to baseline. Etiology related to electrolyte abnormalities and dehydration. Has had nausea with poor PO intake and diarrhea. No clear pyuria on UA or other evidence of infection.    Hyponatremia- [id number] with supportive care. Continues to have some nausea but improving and tolerating small amount of PO. Contributing to this could be poor PO intake or the other way around. Also on fluoxetine - can consider weaning from this.  - continue supportive care including antiemetics and fluid intake - BMP am - discuss weaning from fluoxetine     Nausea/vomiting/abdominal pain  constipation- stool burden on abdominal CT. Has had 2 large bowel movements with the addition of bowel regimen. Abdominal pain has improved.    HTN/HLD - Will resume home dose of the medications  Body mass index is 20.2 kg/m.  VTE ppx: enoxaparin  (LOVENOX ) injection 40  mg Start: 05/10/24 2200 SCDs Start: 05/10/24 1521  Diet:     Diet   Diet regular Room service appropriate? Yes; Fluid consistency: Thin   Consultants: None   Subjective 05/11/24    Pt reports having strong tenesmus (this is seeing her prior to the 2 Bms). Nausea has improved. Tolerated small amount of breakfast and feels hungry for lunch.    Objective  Blood pressure 136/81, pulse 80, temperature (!) 97.4 F (36.3 C), resp. rate 16, height 5' 2 (1.575 m), weight 50.1 kg, SpO2 95%.  Intake/Output Summary (Last 24 hours) at 05/11/2024 0751 Last data filed at 05/11/2024 9378 Gross per 24 hour  Intake 2939.84 ml  Output 100 ml  Net 2839.84 ml   Filed Weights   05/10/24 2006 05/10/24 2359  Weight: 55.4 kg 50.1 kg    Physical Exam:  General: awake, alert, NAD HEENT: atraumatic, clear conjunctiva, anicteric sclera, MMM, hearing grossly normal Respiratory: normal respiratory effort. Cardiovascular: extremities well perfused, quick capillary refill, normal S1/S2, RRR, no JVD, murmurs Gastrointestinal: soft, NT, ND Nervous: A&O x3. no gross focal neurologic deficits, normal speech Extremities: moves all equally, no edema, normal tone Skin: dry, intact, normal temperature, normal color. No rashes, lesions or ulcers on exposed skin Psychiatry: normal mood, congruent affect  Labs   I have personally reviewed the following labs and imaging studies CBC    Component Value Date/Time   WBC 5.4 05/11/2024 0400   RBC 4.02 05/11/2024 0400   HGB 12.1 05/11/2024 0400   HGB 12.1 03/31/2024 1153   HCT 36.3 05/11/2024 0400   HCT  37.9 03/31/2024 1153   PLT 189 05/11/2024 0400   PLT 237 03/31/2024 1153   MCV 90.3 05/11/2024 0400   MCV 94 03/31/2024 1153   MCH 30.1 05/11/2024 0400   MCHC 33.3 05/11/2024 0400   RDW 12.5 05/11/2024 0400   RDW 13.2 03/31/2024 1153   LYMPHSABS 1.2 03/31/2024 1153   MONOABS 0.8 09/07/2023 1443   EOSABS 0.2 03/31/2024 1153   BASOSABS 0.1 03/31/2024 1153       Latest Ref Rng & Units 05/11/2024    4:00 AM 05/10/2024   11:15 AM 03/31/2024   11:53 AM  BMP  Glucose 70 - 99 mg/dL 63  94  81   BUN 8 - 23 mg/dL 11  14  12    Creatinine 0.44 - 1.00 mg/dL 9.16  9.16  9.16   BUN/Creat Ratio 12 - 28   14   Sodium 135 - 145 mmol/L 131  128  136   Potassium 3.5 - 5.1 mmol/L 3.7  4.1  4.4   Chloride 98 - 111 mmol/L 103  94  98   CO2 22 - 32 mmol/L 19  23  24    Calcium  8.9 - 10.3 mg/dL 8.3  8.9  9.0     CT Head Wo Contrast Result Date: 05/10/2024 EXAM: CT HEAD WITHOUT CONTRAST 05/10/2024 03:43:38 PM TECHNIQUE: CT of the head was performed without the administration of intravenous contrast. Automated exposure control, iterative reconstruction, and/or weight based adjustment of the mA/kV was utilized to reduce the radiation dose to as low as reasonably achievable. COMPARISON: Head CT dated 10/16/2023. CLINICAL HISTORY: Mental status change, unknown cause. First Nurse Note: Patient to ED via [facility] from home for lower abd pain with N/V. Ongoing for a few days. FINDINGS: BRAIN AND VENTRICLES: Residual enhancement of the central vessels, leptomeninges, and falx from the earlier abdomen and pelvis CT with contrast today. Mild cerebral atrophy and small vessel disease. Mild atrophic ventriculomegaly. No midline shift. No cortical based acute infarct, hemorrhage, mass, or mass effect are seen Although the presence of onboard contrast does limit evaluation for subtle subarachnoid hemorrhage. Patchy calcifications in the carotid siphons, distal vertebral and basilar arteries with vertebrobasilar dolichoectasia. No large vessel occlusion is suspected. ORBITS: No acute orbital findings with old lens replacements again shown. SINUSES: Chronic sinonasal surgical changes with minimal mucosal disease in the sphenoid sinus. Other sinuses are clear. There is bilaterally increased fluid in the mastoid tips. Above this, the remaining mastoids are clear. SOFT TISSUES AND SKULL:  Right-sided suboccipital craniectomy changes are again noted with overlying wire mesh. No skull fractures or focal skull lesions are seen. No acute soft tissue abnormality. IMPRESSION: 1. No acute intracranial CT  abnormality. 2. Mild cerebral atrophy and small vessel disease. 3. Patchy calcifications in the carotid siphons, distal vertebral and basilar arteries with vertebrobasilar dolichoectasia. Persistent vessel enhancement from earlier contrast dose for abdomen and pelvis CT. No large vessel occlusion is suspected. Electronically signed by: Francis Quam MD 05/10/2024 04:26 PM EDT RP Workstation: [id number]   CT ABDOMEN PELVIS W CONTRAST Result Date: 05/10/2024 EXAM: CT ABDOMEN AND PELVIS WITH CONTRAST 05/10/2024 01:40:57 PM TECHNIQUE: CT of the abdomen and pelvis was performed with the administration of 100 mL iohexol  300 mg/mL solution. Multiplanar reformatted images are provided for review. Automated exposure control, iterative reconstruction, and/or weight-based adjustment of the mA/kV was utilized to reduce the radiation dose to as low as reasonably achievable. COMPARISON: None available. CLINICAL HISTORY: Abdominal pain, acute, nonlocalized. FINDINGS: LOWER CHEST: Chronic elevation  of the left hemidiaphragm. Right lung base clear. LIVER: The liver is unremarkable. GALLBLADDER AND BILE DUCTS: Gallbladder is unremarkable. No biliary ductal dilatation. SPLEEN: No acute abnormality. PANCREAS: No acute abnormality. ADRENAL GLANDS: No acute abnormality. KIDNEYS, URETERS AND BLADDER: Within the right kidney, a simple fluid attenuation cortical lesion measures 11 mm on postcontrast enhancement on contrast series. According to the Bosniak classification system of renal cystic masses, the finding is consistent with Bosniak 1 and the recommendation is no further work-up is required. No stones in the kidneys or ureters. No hydronephrosis. No perinephric or periureteral stranding. Urinary bladder is unremarkable.  GI AND BOWEL: The stomach is partially imaged due to elevation of the left hemidiaphragm. Small bowel is normal. Pedicles not identified. No secondary signs of appendicitis. Stool in the cecum and ascending colon. Stool throughout the transverse and descending colon. Diverticula of the sigmoid colon without acute inflammation. Stool and gas within the rectum. There is no bowel obstruction. PERITONEUM AND RETROPERITONEUM: No ascites. No free air. VASCULATURE: Aorta is normal in caliber. LYMPH NODES: No lymphadenopathy. REPRODUCTIVE ORGANS: Post hysterectomy. BONES AND SOFT TISSUES: Degenerative spurring of the spine. No acute osseous abnormality. No focal soft tissue abnormality. IMPRESSION: 1. Moderate Colonic stool burden without obstruction; sigmoid diverticulosis without acute diverticulitis. 2. Chronic elevation of the left hemidiaphragm. Electronically signed by: Norleen Boxer MD 05/10/2024 02:16 PM EDT RP Workstation: [id number]   Disposition Plan & Communication  Patient status: Observation  Admitted From: Home Planned disposition location: Home Anticipated discharge date: 10/10 pending clinical improvement   Family Communication: none at bedside    Author: Marien LITTIE Piety, DO [facility] 05/11/2024, 7:51 AM   Available by Epic secure chat 7AM-7PM. If 7PM-7AM, please contact night-coverage.  [facility] contact information found on [email or web address].

## 2024-05-11 NOTE — Evaluation (Signed)
 Physical Therapy Evaluation Patient Details Name: Brooke [name] MRN: [id number] DOB: May 28, 1946 Today's Date: 05/11/2024  History of Present Illness  Pt is a 78 year old female came in to ED for confusion, nausea, vomiting, abdominal pain and dysuria.    PMH significant for  asthma, HTN, HLD, depression, history of hyponatremia, cognitive impairment  Clinical Impression  Patient received on BSC. She is agreeable to PT assessment when finished. Patient requires cga for sit to stand and assistance for self care. She was able to ambulate 25 feet pushing IV pole and supervision. Patient is limited by fatigue this session. Although reports she feels better after BM. Patient will continue to benefit from skilled PT to improve safety and independence.           If plan is discharge home, recommend the following: A little help with walking and/or transfers;A little help with bathing/dressing/bathroom;Help with stairs or ramp for entrance;Direct supervision/assist for medications management;Assist for transportation;Direct supervision/assist for financial management;Assistance with cooking/housework   Can travel by private vehicle    yes    Equipment Recommendations None recommended by PT  Recommendations for Other Services       Functional Status Assessment Patient has had a recent decline in their functional status and demonstrates the ability to make significant improvements in function in a reasonable and predictable amount of time.     Precautions / Restrictions Precautions Precautions: Fall Recall of Precautions/Restrictions: Impaired Restrictions Weight Bearing Restrictions Per Provider Order: No      Mobility  Bed Mobility Overal bed mobility: Modified Independent Bed Mobility: Sit to Supine       Sit to supine: Modified independent (Device/Increase time)        Transfers Overall transfer level: Needs assistance Equipment used: None Transfers: Sit to/from Stand Sit to  Stand: Contact guard assist                Ambulation/Gait Ambulation/Gait assistance: Supervision Gait Distance (Feet): 25 Feet Assistive device: IV Pole Gait Pattern/deviations: Step-through pattern Gait velocity: WFL     General Gait Details: patient steady pushing IV pole  Stairs            Wheelchair Mobility     Tilt Bed    Modified Rankin (Stroke Patients Only)       Balance Overall balance assessment: Modified Independent Sitting-balance support: Feet supported Sitting balance-Leahy Scale: Good     Standing balance support: Bilateral upper extremity supported, During functional activity, Single extremity supported Standing balance-Leahy Scale: Good                               Pertinent Vitals/Pain Pain Assessment Pain Assessment: No/denies pain    Home Living Family/patient expects to be discharged to:: Private residence Living Arrangements: Children Available Help at Discharge: Family;Available 24 hours/day Type of Home: House Home Access: Stairs to enter Entrance Stairs-Rails: None Entrance Stairs-Number of Steps:  a few   Home Layout: One level Home Equipment: Agricultural consultant (2 wheels);Cane - single point;BSC/3in1 Additional Comments: pt reports recently moved in with her daughter,daughter not present in room at time of assessment.    Prior Function Prior Level of Function : Needs assist;Patient poor historian/Family not available             Mobility Comments: pt reports use of SPC in the house, rarely goes into community for amb ADLs Comments: Pt reports she performs dressing, feeding, grooming, toileting with MOD  I, reports increased difficulties with transfer into shower so has been doing sponge baths     Extremity/Trunk Assessment   Upper Extremity Assessment Upper Extremity Assessment: Defer to OT evaluation    Lower Extremity Assessment Lower Extremity Assessment: Generalized weakness    Cervical /  Trunk Assessment Cervical / Trunk Assessment: Normal  Communication   Communication Communication: Impaired Factors Affecting Communication: Hearing impaired    Cognition Arousal: Alert Behavior During Therapy: WFL for tasks assessed/performed   PT - Cognitive impairments: No family/caregiver present to determine baseline                         Following commands: Impaired Following commands impaired: Follows one step commands with increased time     Cueing Cueing Techniques: Verbal cues, Tactile cues, Visual cues     General Comments      Exercises     Assessment/Plan    PT Assessment Patient needs continued PT services  PT Problem List Decreased strength;Decreased activity tolerance;Decreased balance;Decreased mobility;Decreased cognition;Decreased safety awareness       PT Treatment Interventions DME instruction;Gait training;Stair training;Functional mobility training;Therapeutic activities;Therapeutic exercise;Balance training;Patient/family education    PT Goals (Current goals can be found in the Care Plan section)  Acute Rehab PT Goals Patient Stated Goal: return home PT Goal Formulation: With patient Time For Goal Achievement: 05/18/24 Potential to Achieve Goals: Good    Frequency Min 2X/week     Co-evaluation               AM-PAC PT 6 Clicks Mobility  Outcome Measure Help needed turning from your back to your side while in a flat bed without using bedrails?: A Little Help needed moving from lying on your back to sitting on the side of a flat bed without using bedrails?: A Little Help needed moving to and from a bed to a chair (including a wheelchair)?: A Little Help needed standing up from a chair using your arms (e.g., wheelchair or bedside chair)?: A Little Help needed to walk in hospital room?: A Little Help needed climbing 3-5 steps with a railing? : A Little 6 Click Score: 18    End of Session Equipment Utilized During  Treatment: Gait belt Activity Tolerance: Patient limited by fatigue Patient left: in bed;with call bell/phone within reach;with bed alarm set Nurse Communication: Mobility status PT Visit Diagnosis: Other abnormalities of gait and mobility (R26.89);Muscle weakness (generalized) (M62.81);Difficulty in walking, not elsewhere classified (R26.2);Unsteadiness on feet (R26.81)    Time: 8688-8662 PT Time Calculation (min) (ACUTE ONLY): 26 min   Charges:   PT Evaluation $PT Eval Low Complexity: 1 Low PT Treatments $Therapeutic Activity: 8-22 mins PT General Charges $$ ACUTE PT VISIT: 1 Visit         Huriel Matt, PT, GCS 05/11/24,2:17 PM

## 2024-05-12 DIAGNOSIS — R4182 Altered mental status, unspecified: Secondary | ICD-10-CM | POA: Diagnosis not present

## 2024-05-12 DIAGNOSIS — E860 Dehydration: Secondary | ICD-10-CM

## 2024-05-12 DIAGNOSIS — R110 Nausea: Secondary | ICD-10-CM | POA: Diagnosis not present

## 2024-05-12 DIAGNOSIS — E871 Hypo-osmolality and hyponatremia: Principal | ICD-10-CM

## 2024-05-12 DIAGNOSIS — R1084 Generalized abdominal pain: Secondary | ICD-10-CM | POA: Diagnosis not present

## 2024-05-12 LAB — BASIC METABOLIC PANEL WITH GFR
Anion gap: 9 (ref 5–15)
BUN: 6 mg/dL — ABNORMAL LOW (ref 8–23)
CO2: 25 mmol/L (ref 22–32)
Calcium: 8.7 mg/dL — ABNORMAL LOW (ref 8.9–10.3)
Chloride: 98 mmol/L (ref 98–111)
Creatinine, Ser: 0.73 mg/dL (ref 0.44–1.00)
GFR, Estimated: >60 mL/min (ref >60)
Glucose, Bld: 84 mg/dL (ref 70–99)
Potassium: 3.6 mmol/L (ref 3.5–5.1)
Sodium: 132 mmol/L — ABNORMAL LOW (ref 135–145)

## 2024-05-12 MED ORDER — ONDANSETRON HCL 4 MG PO TABS: 4.0000 mg | ORAL_TABLET | Freq: Three times a day (TID) | ORAL | 0 refills | Status: DC | PRN

## 2024-05-12 MED ORDER — POLYETHYLENE GLYCOL 3350 17 G PO PACK: 17.0000 g | PACK | Freq: Two times a day (BID) | ORAL | 0 refills | Status: DC

## 2024-05-12 NOTE — Progress Notes (Incomplete)
 PROGRESS NOTE Brooke [name]    DOB: 1946/05/13, 78 y.o.  FMW:[id number]    Code Status: Full Code   DOA: 05/10/2024   LOS: 1  Brief hospital course  Brooke [name] is a 78 y.o. female with a PMH significant for asthma, HTN, HLD, depression, history of hyponatremia in the past who was brought in to ED at [facility] for confusion/disorientation, nausea, vomiting, abdominal discomfort.  ED Course: hyponatremic at 128, no leukocytosis. UA not significant for pyuria  Significant findings included CT head negative for acute changes Abdominal CT showed significant stool burden without obstruction.  They were initially treated with IVF.   05/12/24 -appears to be at mental baseline. Na+ improving with supportive care. Complains of constipation.   Assessment & Plan  Active Problems:   Sensorineural hearing loss (SNHL) of right ear with unrestricted hearing of left ear   Anxiety   Major depressive disorder with single episode, in full remission   Cognitive impairment   Hyponatremia  Acute change in mental status in the setting of cognitive impairment- resolved back to baseline. Etiology related to electrolyte abnormalities and dehydration. Has had nausea with poor PO intake and diarrhea. No clear pyuria on UA or other evidence of infection.    Hyponatremia- [id number] with supportive care. Continues to have some nausea but improving and tolerating small amount of PO. Contributing to this could be poor PO intake or the other way around. Also on fluoxetine - can consider weaning from this.  - continue supportive care including antiemetics and fluid intake - BMP am - discuss weaning from fluoxetine     Nausea/vomiting/abdominal pain  constipation- stool burden on abdominal CT. Has had 2 large bowel movements with the addition of bowel regimen. Abdominal pain has improved.    HTN/HLD - Will resume home dose of the medications  Body mass index is 20.2 kg/m.  VTE ppx: enoxaparin  (LOVENOX ) injection 40  mg Start: 05/10/24 2200 SCDs Start: 05/10/24 1521  Diet:     Diet   Diet regular Room service appropriate? Yes; Fluid consistency: Thin   Consultants: None   Subjective 05/12/24    Pt reports having strong tenesmus (this is seeing her prior to the 2 Bms). Nausea has improved. Tolerated small amount of breakfast and feels hungry for lunch.    Objective  Blood pressure 136/81, pulse 80, temperature (!) 97.4 F (36.3 C), resp. rate 16, height 5' 2 (1.575 m), weight 50.1 kg, SpO2 95%.  Intake/Output Summary (Last 24 hours) at 05/12/2024 0820 Last data filed at 05/12/2024 0300 Gross per 24 hour  Intake 480 ml  Output 700 ml  Net -220 ml   Filed Weights   05/10/24 2006 05/10/24 2359  Weight: 55.4 kg 50.1 kg    Physical Exam:  General: awake, alert, NAD HEENT: atraumatic, clear conjunctiva, anicteric sclera, MMM, hearing grossly normal Respiratory: normal respiratory effort. Cardiovascular: extremities well perfused, quick capillary refill, normal S1/S2, RRR, no JVD, murmurs Gastrointestinal: soft, NT, ND Nervous: A&O x3. no gross focal neurologic deficits, normal speech Extremities: moves all equally, no edema, normal tone Skin: dry, intact, normal temperature, normal color. No rashes, lesions or ulcers on exposed skin Psychiatry: normal mood, congruent affect  Labs   I have personally reviewed the following labs and imaging studies CBC    Component Value Date/Time   WBC 5.4 05/11/2024 0400   RBC 4.02 05/11/2024 0400   HGB 12.1 05/11/2024 0400   HGB 12.1 03/31/2024 1153   HCT 36.3 05/11/2024 0400   HCT  37.9 03/31/2024 1153   PLT 189 05/11/2024 0400   PLT 237 03/31/2024 1153   MCV 90.3 05/11/2024 0400   MCV 94 03/31/2024 1153   MCH 30.1 05/11/2024 0400   MCHC 33.3 05/11/2024 0400   RDW 12.5 05/11/2024 0400   RDW 13.2 03/31/2024 1153   LYMPHSABS 1.2 03/31/2024 1153   MONOABS 0.8 09/07/2023 1443   EOSABS 0.2 03/31/2024 1153   BASOSABS 0.1 03/31/2024 1153       Latest Ref Rng & Units 05/12/2024    4:15 AM 05/11/2024    4:00 AM 05/10/2024   11:15 AM  BMP  Glucose 70 - 99 mg/dL 84  63  94   BUN 8 - 23 mg/dL 6  11  14    Creatinine 0.44 - 1.00 mg/dL 9.26  9.16  9.16   Sodium 135 - 145 mmol/L 132  131  128   Potassium 3.5 - 5.1 mmol/L 3.6  3.7  4.1   Chloride 98 - 111 mmol/L 98  103  94   CO2 22 - 32 mmol/L 25  19  23    Calcium  8.9 - 10.3 mg/dL 8.7  8.3  8.9     CT Head Wo Contrast Result Date: 05/10/2024 EXAM: CT HEAD WITHOUT CONTRAST 05/10/2024 03:43:38 PM TECHNIQUE: CT of the head was performed without the administration of intravenous contrast. Automated exposure control, iterative reconstruction, and/or weight based adjustment of the mA/kV was utilized to reduce the radiation dose to as low as reasonably achievable. COMPARISON: Head CT dated 10/16/2023. CLINICAL HISTORY: Mental status change, unknown cause. First Nurse Note: Patient to ED via [facility] from home for lower abd pain with N/V. Ongoing for a few days. FINDINGS: BRAIN AND VENTRICLES: Residual enhancement of the central vessels, leptomeninges, and falx from the earlier abdomen and pelvis CT with contrast today. Mild cerebral atrophy and small vessel disease. Mild atrophic ventriculomegaly. No midline shift. No cortical based acute infarct, hemorrhage, mass, or mass effect are seen Although the presence of onboard contrast does limit evaluation for subtle subarachnoid hemorrhage. Patchy calcifications in the carotid siphons, distal vertebral and basilar arteries with vertebrobasilar dolichoectasia. No large vessel occlusion is suspected. ORBITS: No acute orbital findings with old lens replacements again shown. SINUSES: Chronic sinonasal surgical changes with minimal mucosal disease in the sphenoid sinus. Other sinuses are clear. There is bilaterally increased fluid in the mastoid tips. Above this, the remaining mastoids are clear. SOFT TISSUES AND SKULL: Right-sided suboccipital craniectomy changes are  again noted with overlying wire mesh. No skull fractures or focal skull lesions are seen. No acute soft tissue abnormality. IMPRESSION: 1. No acute intracranial CT  abnormality. 2. Mild cerebral atrophy and small vessel disease. 3. Patchy calcifications in the carotid siphons, distal vertebral and basilar arteries with vertebrobasilar dolichoectasia. Persistent vessel enhancement from earlier contrast dose for abdomen and pelvis CT. No large vessel occlusion is suspected. Electronically signed by: Francis Quam MD 05/10/2024 04:26 PM EDT RP Workstation: [id number]   CT ABDOMEN PELVIS W CONTRAST Result Date: 05/10/2024 EXAM: CT ABDOMEN AND PELVIS WITH CONTRAST 05/10/2024 01:40:57 PM TECHNIQUE: CT of the abdomen and pelvis was performed with the administration of 100 mL iohexol  300 mg/mL solution. Multiplanar reformatted images are provided for review. Automated exposure control, iterative reconstruction, and/or weight-based adjustment of the mA/kV was utilized to reduce the radiation dose to as low as reasonably achievable. COMPARISON: None available. CLINICAL HISTORY: Abdominal pain, acute, nonlocalized. FINDINGS: LOWER CHEST: Chronic elevation of the left hemidiaphragm. Right lung base clear. LIVER:  The liver is unremarkable. GALLBLADDER AND BILE DUCTS: Gallbladder is unremarkable. No biliary ductal dilatation. SPLEEN: No acute abnormality. PANCREAS: No acute abnormality. ADRENAL GLANDS: No acute abnormality. KIDNEYS, URETERS AND BLADDER: Within the right kidney, a simple fluid attenuation cortical lesion measures 11 mm on postcontrast enhancement on contrast series. According to the Bosniak classification system of renal cystic masses, the finding is consistent with Bosniak 1 and the recommendation is no further work-up is required. No stones in the kidneys or ureters. No hydronephrosis. No perinephric or periureteral stranding. Urinary bladder is unremarkable. GI AND BOWEL: The stomach is partially imaged due  to elevation of the left hemidiaphragm. Small bowel is normal. Pedicles not identified. No secondary signs of appendicitis. Stool in the cecum and ascending colon. Stool throughout the transverse and descending colon. Diverticula of the sigmoid colon without acute inflammation. Stool and gas within the rectum. There is no bowel obstruction. PERITONEUM AND RETROPERITONEUM: No ascites. No free air. VASCULATURE: Aorta is normal in caliber. LYMPH NODES: No lymphadenopathy. REPRODUCTIVE ORGANS: Post hysterectomy. BONES AND SOFT TISSUES: Degenerative spurring of the spine. No acute osseous abnormality. No focal soft tissue abnormality. IMPRESSION: 1. Moderate Colonic stool burden without obstruction; sigmoid diverticulosis without acute diverticulitis. 2. Chronic elevation of the left hemidiaphragm. Electronically signed by: Norleen Boxer MD 05/10/2024 02:16 PM EDT RP Workstation: [id number]   Disposition Plan & Communication  Patient status: Inpatient  Admitted From: Home Planned disposition location: Home Anticipated discharge date: 10/10 pending clinical improvement   Family Communication: none at bedside    Author: Marien LITTIE Piety, DO [facility] 05/12/2024, 8:20 AM   Available by Epic secure chat 7AM-7PM. If 7PM-7AM, please contact night-coverage.  [facility] contact information found on [email or web address].

## 2024-05-12 NOTE — Discharge Summary (Signed)
 Physician Discharge Summary  Patient: Brooke [name] FMW:[id number] DOB: 07/22/1946   Code Status: Full Code Admit date: 05/10/2024 Discharge date: 05/12/2024 Disposition: Home health, PT and OT PCP: Brooke Hadassah SQUIBB, MD  Recommendations for Outpatient Follow-up:  Follow up with PCP within 1-2 weeks Regarding general hospital follow up and preventative care Consider wean from fluoxetine  Recommend BMP. Na+ 132 on day of dc  Discharge Diagnoses:  Active Problems:   Sensorineural hearing loss (SNHL) of right ear with unrestricted hearing of left ear   Anxiety   Major depressive disorder with single episode, in full remission   Cognitive impairment   Hyponatremia   Generalized abdominal pain   Altered mental status   Dehydration   Nausea  Brief Hospital Course Summary: Brooke [name] is a 78 y.o. female with a PMH significant for asthma, HTN, HLD, depression, history of hyponatremia in the past who was brought in to ED at [facility] for confusion/disorientation, nausea, vomiting, abdominal discomfort.  ED Course: hyponatremic at 128, no leukocytosis. UA not significant for pyuria  Significant findings included CT head negative for acute changes Abdominal CT showed significant stool burden without obstruction.   They were initially treated with IVF.    Acute change in mental status in the setting of cognitive impairment- resolved back to baseline. Etiology related to electrolyte abnormalities and dehydration. Has had nausea with poor PO intake and diarrhea. No clear pyuria on UA or other evidence of infection.    Hyponatremia- Na+128>>132 with supportive care. Continues to have some nausea but improving and tolerating small amount of PO. Contributing to this could be poor PO intake or the other way around. Also on fluoxetine - can consider weaning from this.    Nausea/vomiting/abdominal pain  constipation- stool burden on abdominal CT. Has had 2 large bowel movements with the addition of  bowel regimen. Abdominal pain has improved.   PT/OT evaluated and recommended home health.   All other chronic conditions were treated with home medications.    Discharge Condition: Good, improved Recommended discharge diet: Regular healthy diet  Consultations: None   Procedures/Studies: None   Allergies as of 05/12/2024       Reactions   Nystatin  Swelling   Sulfa Antibiotics Rash        Medication List     STOP taking these medications    Aspirin  Low Dose 81 MG tablet Generic drug: aspirin  EC   furosemide  20 MG tablet Commonly known as: Lasix    gabapentin  100 MG capsule Commonly known as: NEURONTIN    traZODone  100 MG tablet Commonly known as: DESYREL        TAKE these medications    atorvastatin  40 MG tablet Commonly known as: LIPITOR Take 1 tablet (40 mg total) by mouth daily.   clonazePAM  0.5 MG tablet Commonly known as: KLONOPIN  Take 1 tablet (0.5 mg total) by mouth 2 (two) times daily as needed for anxiety. Start taking on: May 28, 2024   FLUoxetine  40 MG capsule Commonly known as: PROZAC  Take 1 capsule (40 mg total) by mouth every morning.   ipratropium 0.06 % nasal spray Commonly known as: ATROVENT  Place 1-2 sprays into both nostrils 2 (two) times daily as needed for runny nose.   losartan  25 MG tablet Commonly known as: COZAAR  Take 1 tablet (25 mg total) by mouth in the morning and at bedtime.   montelukast  10 MG tablet Commonly known as: SINGULAIR  Take 1 tablet (10 mg total) by mouth daily for allergies and wheezing   omeprazole  20  MG capsule Commonly known as: PRILOSEC Take 1 capsule (20 mg total) by mouth 2 (two) times daily before a meal   ondansetron  4 MG tablet Commonly known as: Zofran  Take 1 tablet (4 mg total) by mouth every 8 (eight) hours as needed for nausea or vomiting.   polyethylene glycol 17 g packet Commonly known as: MIRALAX  / GLYCOLAX  Take 17 g by mouth 2 (two) times daily.   Vitamin D  (Ergocalciferol )  1.25 MG (50000 UNIT) Caps capsule Commonly known as: DRISDOL  Take 1 capsule (50,000 Units total) by mouth every 7 (seven) days.        Contact information for follow-up providers     Brooke Hadassah SQUIBB, MD. Schedule an appointment as soon as possible for a visit in 1 week(s).   Specialty: Family Medicine Contact information: [address] [place] [address] [phone]              Contact information for after-discharge care     Home Medical Care     [facility] [address] .   Service: Home Health Services Contact information: [address] [place]  [address] [phone]                     Subjective   Pt reports feeling well. Abdominal pain has resolved. No nausea this am.   All questions and concerns were addressed at time of discharge.  Objective  Blood pressure 138/89, pulse 84, temperature 97.8 F (36.6 C), resp. rate 17, height 5' 2 (1.575 m), weight 50.1 kg, SpO2 97%.   General: Pt is alert, awake, not in acute distress Cardiovascular: RRR, S1/S2 +, no rubs, no gallops Respiratory: CTA bilaterally, no wheezing, no rhonchi Abdominal: Soft, NT, ND, bowel sounds + Extremities: no edema, no cyanosis  The results of significant diagnostics from this hospitalization (including imaging, microbiology, ancillary and laboratory) are listed below for reference.   Imaging studies: CT Head Wo Contrast Result Date: 05/10/2024 EXAM: CT HEAD WITHOUT CONTRAST 05/10/2024 03:43:38 PM TECHNIQUE: CT of the head was performed without the administration of intravenous contrast. Automated exposure control, iterative reconstruction, and/or weight based adjustment of the mA/kV was utilized to reduce the radiation dose to as low as reasonably achievable. COMPARISON: Head CT dated 10/16/2023. CLINICAL HISTORY: Mental status change, unknown cause. First Nurse Note: Patient to ED via [facility] from home for lower abd pain with N/V. Ongoing for a few days.  FINDINGS: BRAIN AND VENTRICLES: Residual enhancement of the central vessels, leptomeninges, and falx from the earlier abdomen and pelvis CT with contrast today. Mild cerebral atrophy and small vessel disease. Mild atrophic ventriculomegaly. No midline shift. No cortical based acute infarct, hemorrhage, mass, or mass effect are seen Although the presence of onboard contrast does limit evaluation for subtle subarachnoid hemorrhage. Patchy calcifications in the carotid siphons, distal vertebral and basilar arteries with vertebrobasilar dolichoectasia. No large vessel occlusion is suspected. ORBITS: No acute orbital findings with old lens replacements again shown. SINUSES: Chronic sinonasal surgical changes with minimal mucosal disease in the sphenoid sinus. Other sinuses are clear. There is bilaterally increased fluid in the mastoid tips. Above this, the remaining mastoids are clear. SOFT TISSUES AND SKULL: Right-sided suboccipital craniectomy changes are again noted with overlying wire mesh. No skull fractures or focal skull lesions are seen. No acute soft tissue abnormality. IMPRESSION: 1. No acute intracranial CT  abnormality. 2. Mild cerebral atrophy and small vessel disease. 3. Patchy calcifications in the carotid siphons, distal vertebral and basilar  arteries with vertebrobasilar dolichoectasia. Persistent vessel enhancement from earlier contrast dose for abdomen and pelvis CT. No large vessel occlusion is suspected. Electronically signed by: Francis Quam MD 05/10/2024 04:26 PM EDT RP Workstation: [id number]   CT ABDOMEN PELVIS W CONTRAST Result Date: 05/10/2024 EXAM: CT ABDOMEN AND PELVIS WITH CONTRAST 05/10/2024 01:40:57 PM TECHNIQUE: CT of the abdomen and pelvis was performed with the administration of 100 mL iohexol  300 mg/mL solution. Multiplanar reformatted images are provided for review. Automated exposure control, iterative reconstruction, and/or weight-based adjustment of the mA/kV was utilized to  reduce the radiation dose to as low as reasonably achievable. COMPARISON: None available. CLINICAL HISTORY: Abdominal pain, acute, nonlocalized. FINDINGS: LOWER CHEST: Chronic elevation of the left hemidiaphragm. Right lung base clear. LIVER: The liver is unremarkable. GALLBLADDER AND BILE DUCTS: Gallbladder is unremarkable. No biliary ductal dilatation. SPLEEN: No acute abnormality. PANCREAS: No acute abnormality. ADRENAL GLANDS: No acute abnormality. KIDNEYS, URETERS AND BLADDER: Within the right kidney, a simple fluid attenuation cortical lesion measures 11 mm on postcontrast enhancement on contrast series. According to the Bosniak classification system of renal cystic masses, the finding is consistent with Bosniak 1 and the recommendation is no further work-up is required. No stones in the kidneys or ureters. No hydronephrosis. No perinephric or periureteral stranding. Urinary bladder is unremarkable. GI AND BOWEL: The stomach is partially imaged due to elevation of the left hemidiaphragm. Small bowel is normal. Pedicles not identified. No secondary signs of appendicitis. Stool in the cecum and ascending colon. Stool throughout the transverse and descending colon. Diverticula of the sigmoid colon without acute inflammation. Stool and gas within the rectum. There is no bowel obstruction. PERITONEUM AND RETROPERITONEUM: No ascites. No free air. VASCULATURE: Aorta is normal in caliber. LYMPH NODES: No lymphadenopathy. REPRODUCTIVE ORGANS: Post hysterectomy. BONES AND SOFT TISSUES: Degenerative spurring of the spine. No acute osseous abnormality. No focal soft tissue abnormality. IMPRESSION: 1. Moderate Colonic stool burden without obstruction; sigmoid diverticulosis without acute diverticulitis. 2. Chronic elevation of the left hemidiaphragm. Electronically signed by: Norleen Boxer MD 05/10/2024 02:16 PM EDT RP Workstation: [id number]    Labs: Basic Metabolic Panel: Recent Labs  Lab 05/10/24 1115  05/11/24 0400 05/12/24 0415  NA 128* 131* 132*  K 4.1 3.7 3.6  CL 94* 103 98  CO2 23 19* 25  GLUCOSE 94 63* 84  BUN 14 11 6*  CREATININE 0.83 0.83 0.73  CALCIUM  8.9 8.3* 8.7*   CBC: Recent Labs  Lab 05/10/24 1115 05/11/24 0400  WBC 6.4 5.4  HGB 12.7 12.1  HCT 37.8 36.3  MCV 89.2 90.3  PLT 241 189   Microbiology: Results for orders placed or performed during the hospital encounter of 05/10/24  Resp panel by RT-PCR (RSV, Flu A&B, Covid) Anterior Nasal Swab     Status: None   Collection Time: 05/10/24  1:11 PM   Specimen: Anterior Nasal Swab  Result Value Ref Range Status   SARS Coronavirus 2 by RT PCR NEGATIVE NEGATIVE Final    Comment: (NOTE) SARS-CoV-2 target nucleic acids are NOT DETECTED.  The SARS-CoV-2 RNA is generally detectable in upper respiratory specimens during the acute phase of infection. The lowest concentration of SARS-CoV-2 viral copies this assay can detect is 138 copies/mL. A negative result does not preclude SARS-Cov-2 infection and should not be used as the sole basis for treatment or other patient management decisions. A negative result may occur with  improper specimen collection/handling, submission of specimen other than nasopharyngeal swab, presence of viral mutation(s) within the  areas targeted by this assay, and inadequate number of viral copies(<138 copies/mL). A negative result must be combined with clinical observations, patient history, and epidemiological information. The expected result is Negative.  Fact Sheet for Patients:  BloggerCourse.com  Fact Sheet for Healthcare Providers:  SeriousBroker.it  This test is no t yet approved or cleared by the [place]  FDA and  has been authorized for detection and/or diagnosis of SARS-CoV-2 by FDA under an Emergency Use Authorization (EUA). This EUA will remain  in effect (meaning this test can be used) for the duration of the COVID-19  declaration under Section 564(b)(1) of the Act, 21 U.S.C.section 360bbb-3(b)(1), unless the authorization is terminated  or revoked sooner.       Influenza A by PCR NEGATIVE NEGATIVE Final   Influenza B by PCR NEGATIVE NEGATIVE Final    Comment: (NOTE) The Xpert Xpress SARS-CoV-2/FLU/RSV plus assay is intended as an aid in the diagnosis of influenza from Nasopharyngeal swab specimens and should not be used as a sole basis for treatment. Nasal washings and aspirates are unacceptable for Xpert Xpress SARS-CoV-2/FLU/RSV testing.  Fact Sheet for Patients: BloggerCourse.com  Fact Sheet for Healthcare Providers: SeriousBroker.it  This test is not yet approved or cleared by the [place]  FDA and has been authorized for detection and/or diagnosis of SARS-CoV-2 by FDA under an Emergency Use Authorization (EUA). This EUA will remain in effect (meaning this test can be used) for the duration of the COVID-19 declaration under Section 564(b)(1) of the Act, 21 U.S.C. section 360bbb-3(b)(1), unless the authorization is terminated or revoked.     Resp Syncytial Virus by PCR NEGATIVE NEGATIVE Final    Comment: (NOTE) Fact Sheet for Patients: BloggerCourse.com  Fact Sheet for Healthcare Providers: SeriousBroker.it  This test is not yet approved or cleared by the [place]  FDA and has been authorized for detection and/or diagnosis of SARS-CoV-2 by FDA under an Emergency Use Authorization (EUA). This EUA will remain in effect (meaning this test can be used) for the duration of the COVID-19 declaration under Section 564(b)(1) of the Act, 21 U.S.C. section 360bbb-3(b)(1), unless the authorization is terminated or revoked.  Performed at [facility], [address], [place], [place] [address]     Time coordinating discharge: Over 30 minutes  Marien LITTIE Piety, MD  [facility] 05/12/2024, 10:30 AM

## 2024-05-12 NOTE — Progress Notes (Signed)
 Physical Therapy Treatment Patient Details Name: Brooke [name] MRN: [id number] DOB: 03-19-1946 Today's Date: 05/12/2024   History of Present Illness Pt is a 78 year old female came in to ED for confusion, nausea, vomiting, abdominal pain and dysuria.    PMH significant for  asthma, HTN, HLD, depression, history of hyponatremia, cognitive impairment    PT Comments  Pt is progressing with mobility with longer gait distance (RW 40').  Pt definitely demonstrates greater balance and safety with RW (CGA - Supervision level)  vs. SPC (Min A -CGA)  during gait and functional standing activities.  Pt continues to demonstrate fatigue, general weakness, and balance impairments limiting independence with mobility.  Pt will benefit from continued PT services upon discharge to safely address deficits listed in patient problem list for decreased caregiver assistance and eventual return to PLOF.     If plan is discharge home, recommend the following: A little help with walking and/or transfers;A little help with bathing/dressing/bathroom;Help with stairs or ramp for entrance;Direct supervision/assist for medications management;Assist for transportation;Direct supervision/assist for financial management;Assistance with cooking/housework   Can travel by private vehicle        Equipment Recommendations  None recommended by PT    Recommendations for Other Services       Precautions / Restrictions Precautions Precautions: Fall Recall of Precautions/Restrictions: Impaired Restrictions Weight Bearing Restrictions Per Provider Order: No     Mobility  Bed Mobility               General bed mobility comments: NT pt up in recliner pre/post session    Transfers Overall transfer level: Needs assistance Equipment used: Rolling walker (2 wheels), Straight cane Transfers: Sit to/from Stand, Bed to chair/wheelchair/BSC Sit to Stand: Contact guard assist   Step pivot transfers: Contact guard assist        General transfer comment: CGA to supervision with RW, MIN A via SPC    Ambulation/Gait Ambulation/Gait assistance: Supervision Gait Distance (Feet): 30 Feet Assistive device: Rolling walker (2 wheels) Gait Pattern/deviations: Step-through pattern Gait velocity: WFL     General Gait Details: Pt is more steady with RW vs. SPC (Min A)   Stairs             Wheelchair Mobility     Tilt Bed    Modified Rankin (Stroke Patients Only)       Balance Overall balance assessment: Modified Independent Sitting-balance support: Feet supported Sitting balance-Leahy Scale: Good     Standing balance support: Bilateral upper extremity supported, During functional activity, Single extremity supported Standing balance-Leahy Scale: Good Standing balance comment: Pt requires intermittent to 1 UE support to maintain functional standing balance and did have a LOB without UE support sitting suddenly down in chair during a transfer without AD.                            Communication Communication Communication: Impaired Factors Affecting Communication: Hearing impaired  Cognition Arousal: Alert Behavior During Therapy: WFL for tasks assessed/performed   PT - Cognitive impairments: No family/caregiver present to determine baseline                         Following commands: Impaired Following commands impaired: Follows one step commands with increased time    Cueing Cueing Techniques: Verbal cues  Exercises      General Comments        Pertinent Vitals/Pain Pain Assessment Pain Assessment:  Faces Pain Score: 2  Faces Pain Scale: Hurts a little bit Pain Location: sacrum Pain Descriptors / Indicators: Discomfort    Home Living                          Prior Function            PT Goals (current goals can now be found in the care plan section) Acute Rehab PT Goals Patient Stated Goal: return home PT Goal Formulation: With  patient Time For Goal Achievement: 05/18/24 Potential to Achieve Goals: Good Progress towards PT goals: Progressing toward goals    Frequency    Min 2X/week      PT Plan      Co-evaluation              AM-PAC PT 6 Clicks Mobility   Outcome Measure  Help needed turning from your back to your side while in a flat bed without using bedrails?: A Little Help needed moving from lying on your back to sitting on the side of a flat bed without using bedrails?: A Little Help needed moving to and from a bed to a chair (including a wheelchair)?: A Little Help needed standing up from a chair using your arms (e.g., wheelchair or bedside chair)?: A Little Help needed to walk in hospital room?: A Little Help needed climbing 3-5 steps with a railing? : A Little 6 Click Score: 18    End of Session Equipment Utilized During Treatment: Gait belt Activity Tolerance: Patient limited by fatigue Patient left: with call bell/phone within reach;in chair;with chair alarm set;with nursing/sitter in room Nurse Communication: Mobility status PT Visit Diagnosis: Other abnormalities of gait and mobility (R26.89);Muscle weakness (generalized) (M62.81);Difficulty in walking, not elsewhere classified (R26.2);Unsteadiness on feet (R26.81)     Time: 9076-9054 PT Time Calculation (min) (ACUTE ONLY): 22 min  Charges:    $Therapeutic Activity: 8-22 mins PT General Charges $$ ACUTE PT VISIT: 1 Visit                     Harland Irving, PTA  05/12/24, 10:29 AM

## 2024-05-12 NOTE — Progress Notes (Signed)
 Occupational Therapy Treatment Patient Details Name: Brooke [name] MRN: [id number] DOB: Apr 11, 1946 Today's Date: 05/12/2024   History of present illness Pt is a 78 year old female came in to ED for confusion, nausea, vomiting, abdominal pain and dysuria.    PMH significant for  asthma, HTN, HLD, depression, history of hyponatremia, cognitive impairment   OT comments  Brooke [name] was seen for OT treatment on this date. Upon arrival to room pt seated in recliner, preparing for anticipated DC later this date, agreeable to OT Tx session. OT facilitated ADL management with education and assistance as described below. See ADL section for additional details regarding occupational performance. Pt continues to be functionally limited by decreased cognition, decreased safety awareness, and limited balance. Pt/provider problem solved strategies to maximize safety and functional independence upon DC home including safe use of RW for functional mobility and use of BSC for improved safety with toileting at night. Pt return verbalizes understanding of education provided t/o session. Pt is progressing toward OT goals and continues to benefit from skilled OT services to maximize return to PLOF and minimize risk of future falls, injury, and readmission. Will continue to follow POC as written. Discharge recommendation remains appropriate.        If plan is discharge home, recommend the following:  A little help with walking and/or transfers;A little help with bathing/dressing/bathroom;Supervision due to cognitive status   Equipment Recommendations  Tub/shower bench;BSC/3in1 (2ww)    Recommendations for Other Services      Precautions / Restrictions Precautions Precautions: Fall Recall of Precautions/Restrictions: Impaired Restrictions Weight Bearing Restrictions Per Provider Order: No       Mobility Bed Mobility               General bed mobility comments: NT pt up in recliner pre/post session     Transfers Overall transfer level: Needs assistance Equipment used: Rolling walker (2 wheels), Straight cane Transfers: Sit to/from Stand, Bed to chair/wheelchair/BSC Sit to Stand: Supervision     Step pivot transfers: Supervision           Balance Overall balance assessment: Needs assistance Sitting-balance support: Feet supported Sitting balance-Leahy Scale: Good     Standing balance support: Bilateral upper extremity supported, During functional activity, Single extremity supported Standing balance-Leahy Scale: Good                             ADL either performed or assessed with clinical judgement   ADL Overall ADL's : Needs assistance/impaired     Grooming: Wash/dry face;Sitting;Modified independent                   Toilet Transfer: Rolling walker (2 wheels);Cueing for safety;Cueing for sequencing;Supervision/safety;Set up Toilet Transfer Details (indicate cue type and reason): short amb transfer to bedside commode, min cueing for technique and safe use of RW. Toileting- Clothing Manipulation and Hygiene: Sit to/from stand;Minimal assistance Toileting - Clothing Manipulation Details (indicate cue type and reason): MIN A to pull pants/brief up over hips after use of commode. SUPERVISION for peri-care     Functional mobility during ADLs: Rolling walker (2 wheels);Supervision/safety;Set up General ADL Comments: Educated on safety, falls prevention, and considerations for DC home t/o session.    Extremity/Trunk Assessment              Vision Baseline Vision/History: 1 Wears glasses Patient Visual Report: No change from baseline     Perception     Praxis  Communication Communication Communication: Impaired Factors Affecting Communication: Hearing impaired   Cognition Arousal: Alert Behavior During Therapy: WFL for tasks assessed/performed Cognition: History of cognitive impairments, No family/caregiver present to determine  baseline, Cognition impaired   Orientation impairments: Time Awareness: Online awareness impaired Memory impairment (select all impairments): Declarative long-term memory Attention impairment (select first level of impairment): Sustained attention Executive functioning impairment (select all impairments): Reasoning, Problem solving                   Following commands: Impaired Following commands impaired: Follows one step commands with increased time      Cueing   Cueing Techniques: Verbal cues  Exercises Other Exercises Other Exercises: OT facilitated ADL management with edu and assist as described above.    Shoulder Instructions       General Comments      Pertinent Vitals/ Pain       Pain Assessment Pain Assessment: No/denies pain  Home Living                                          Prior Functioning/Environment              Frequency  Min 2X/week        Progress Toward Goals  OT Goals(current goals can now be found in the care plan section)  Progress towards OT goals: Progressing toward goals  Acute Rehab OT Goals Patient Stated Goal: go home OT Goal Formulation: With patient Time For Goal Achievement: 05/25/24 Potential to Achieve Goals: Good  Plan      Co-evaluation                 AM-PAC OT 6 Clicks Daily Activity     Outcome Measure   Help from another person eating meals?: None Help from another person taking care of personal grooming?: A Little Help from another person toileting, which includes using toliet, bedpan, or urinal?: A Little Help from another person bathing (including washing, rinsing, drying)?: A Little Help from another person to put on and taking off regular upper body clothing?: A Little Help from another person to put on and taking off regular lower body clothing?: A Little 6 Click Score: 19    End of Session Equipment Utilized During Treatment: Rolling walker (2 wheels)  OT Visit  Diagnosis: Other abnormalities of gait and mobility (R26.89);Muscle weakness (generalized) (M62.81)   Activity Tolerance Patient tolerated treatment well   Patient Left in chair;with call bell/phone within reach;with chair alarm set   Nurse Communication Mobility status        Time: 8688-8673 OT Time Calculation (min): 15 min  Charges: OT General Charges $OT Visit: 1 Visit OT Treatments $Self Care/Home Management : 8-22 mins  Jhonny Pelton, M.S., OTR/L 05/12/24, 2:27 PM

## 2024-05-12 NOTE — Discharge Instructions (Signed)
 I recommend you follow up with your primary doctor in 1-2 weeks to recheck your electrolytes.  Please take a daily stool softener to help prevent constipation.  I prescribed a nausea medication- zofran  to use as needed

## 2024-05-12 NOTE — Plan of Care (Signed)
  Problem: Education: Goal: Knowledge of General Education information will improve Description: Including pain rating scale, medication(s)/side effects and non-pharmacologic comfort measures Outcome: Progressing   Problem: Health Behavior/Discharge Planning: Goal: Ability to manage health-related needs will improve Outcome: Progressing   Problem: Clinical Measurements: Goal: Ability to maintain clinical measurements within normal limits will improve Outcome: Progressing   Problem: Clinical Measurements: Goal: Will remain free from infection Outcome: Progressing   Problem: Activity: Goal: Risk for activity intolerance will decrease Outcome: Progressing

## 2024-05-12 NOTE — TOC Progression Note (Signed)
 Transition of Care [facility]) - Progression Note    Patient Details  Name: Brooke [name] MRN: [id number] Date of Birth: 05-18-1946  Transition of Care [facility]) CM/SW Contact  Alfonso Rummer, LCSW Phone Number: 05/12/2024, 1:43 PM  Clinical Narrative:    [name] Rummer spk with pt daughter via phone. Pt daughter is not in agreement with patient discharge. Pt daughter is requesting pt to remain inpt and receive physical therapy. LCSW A Cannon Arreola advise discharge concerns is best handled with pt's attending physician.                      Expected Discharge Plan and Services         Expected Discharge Date: 05/12/24                                     Social Drivers of Health (SDOH) Interventions SDOH Screenings   Food Insecurity: No Food Insecurity (05/11/2024)  Housing: Low Risk  (05/11/2024)  Transportation Needs: No Transportation Needs (05/11/2024)  Utilities: Not At Risk (05/11/2024)  Alcohol Screen: Low Risk  (06/02/2023)  Depression (PHQ2-9): High Risk (03/31/2024)  Financial Resource Strain: Low Risk  (03/31/2024)  Physical Activity: Inactive (03/31/2024)  Social Connections: Socially Isolated (05/11/2024)  Stress: Stress Concern Present (03/31/2024)  Tobacco Use: Low Risk  (05/10/2024)  Health Literacy: Adequate Health Literacy (06/02/2023)    Readmission Risk Interventions     No data to display

## 2024-05-17 ENCOUNTER — Telehealth: Admitting: Pediatrics

## 2024-05-17 ENCOUNTER — Encounter: Payer: Self-pay | Admitting: Pediatrics

## 2024-05-17 VITALS — BP 118/82 | HR 96

## 2024-05-17 DIAGNOSIS — K582 Mixed irritable bowel syndrome: Secondary | ICD-10-CM | POA: Diagnosis not present

## 2024-05-17 DIAGNOSIS — E871 Hypo-osmolality and hyponatremia: Secondary | ICD-10-CM | POA: Diagnosis not present

## 2024-05-17 NOTE — Progress Notes (Signed)
 Telehealth Visit  I connected with  Brooke Brooke [name] on 05/24/24 by a video enabled telemedicine application and verified that I am speaking with the correct person using two identifiers.   I discussed the limitations of evaluation and management by telemedicine. The patient expressed understanding and agreed to proceed.  Subjective:    Patient ID: Brooke Brooke [name], female    DOB: [birth year], 78 y.o.   MRN: [id number]  HPI: Brooke Brooke [name] is a 78 y.o. female  Chief Complaint  Patient presents with   Follow-up    Brooke Brooke [name] to hospital last Wednesday and stayed until Friday     Discussed the use of AI scribe software for clinical note transcription with the patient, who gave verbal consent to proceed.  History of Present Illness   Brooke Brooke [name] is a 78 year old female who presents with constipation and low sodium levels. She is accompanied by her daughter.  She has been experiencing constipation since her recent hospital discharge, with her last bowel movement occurring nearly a week ago. She has been using MiraLAX , administered by her daughter, but it is ineffective.  She has a history of low sodium levels, with a recent measurement of 126 mEq/L. Her daughter is concerned about her sodium balance, noting that she primarily drinks Sprite and avoids water, which may contribute to her condition.  She has stopped taking gabapentin  and is currently on two doses of peach Cleocin. There is a note from the hospital to wean her off Prozac . She is currently on a 40 mg dose of Prozac .  Her daughter mentions that she is not interested in participating in physical or occupational therapy, despite recommendations for home health services. The daughter finds it challenging to manage the logistics of therapy sessions due to the distance and time commitment required.  She reports feeling 'fairly good' aside from the constipation. No water consumption, primarily consumes Sprite.     Relevant past  medical, surgical, family and social history reviewed and updated as indicated. Interim medical history since our last visit reviewed. Allergies and medications reviewed and updated.  ROS per HPI unless specifically indicated above     Objective:    BP 118/82 Comment: patient obtained  Pulse 96   Wt Readings from Last 3 Encounters:  05/22/24 110 lb 3.7 oz (50 kg)  05/10/24 110 lb 7.2 oz (50.1 kg)  03/31/24 112 lb 6.4 oz (51 kg)     Physical Exam Constitutional:      General: She is not in acute distress.    Appearance: Normal appearance.  Neurological:     General: No focal deficit present.     Mental Status: She is alert. Mental status is at baseline.      LIMITED EXAM GIVEN VIDEO VISIT     Assessment & Plan:  Assessment & Plan   Hyponatremia Hyponatremia with previous sodium level of 126 mEq/L. Potential contributing factors include Prozac  use and inadequate water intake. - Arrange home service to check sodium levels.If not able will need to com ein person - Consider halving Prozac  dose if sodium levels remain low. Given ongoing mood issue, patient and daughter voiced preference to avoid change. - Encourage increased water intake.  Irritable bowel syndrome with both constipation and diarrhea Chronic constipation with recent exacerbation. Current MiraLAX  regimen ineffective. - Increase MiraLAX  to three times daily until bowel movement occurs. - Prescribe Senokot twice daily. - Check in on Friday to assess bowel movement status.  - Advise to call  if constipation becomes painful or if abdominal distension occurs.   Follow up plan: No follow-ups on file.  Brooke [name], MD   This visit was completed via video visit through MyChart due to the restrictions of the COVID-19 pandemic. All issues as above were discussed and addressed. Physical exam was Brooke [name] as above through visual confirmation on video through MyChart. If it was felt that the patient should be evaluated  in the office, they were directed there. The patient verbally consented to this visit.  Location of the patient: home Location of the provider: work Those involved with this call:  Provider: Hadassah Nett, MD CMA: Brooke Brooke [name], CMA Time spent on call: 15 minutes with patient face to face via video conference. More than 50% of this time was spent in counseling and coordination of care. 15 minutes total spent in review of patient's record and preparation of their chart. Total time spent on this encounter: 30 minutes.

## 2024-05-22 ENCOUNTER — Encounter: Payer: Self-pay | Admitting: Emergency Medicine

## 2024-05-22 ENCOUNTER — Emergency Department

## 2024-05-22 ENCOUNTER — Other Ambulatory Visit: Payer: Self-pay

## 2024-05-22 ENCOUNTER — Emergency Department
Admission: EM | Admit: 2024-05-22 | Discharge: 2024-05-22 | Disposition: A | Discharge status: Home / Self Care | Attending: Emergency Medicine | Admitting: Emergency Medicine

## 2024-05-22 DIAGNOSIS — E878 Other disorders of electrolyte and fluid balance, not elsewhere classified: Secondary | ICD-10-CM | POA: Diagnosis not present

## 2024-05-22 DIAGNOSIS — K5900 Constipation, unspecified: Secondary | ICD-10-CM | POA: Diagnosis not present

## 2024-05-22 DIAGNOSIS — N281 Cyst of kidney, acquired: Secondary | ICD-10-CM | POA: Diagnosis not present

## 2024-05-22 DIAGNOSIS — E871 Hypo-osmolality and hyponatremia: Secondary | ICD-10-CM | POA: Insufficient documentation

## 2024-05-22 DIAGNOSIS — K5730 Diverticulosis of large intestine without perforation or abscess without bleeding: Secondary | ICD-10-CM | POA: Diagnosis not present

## 2024-05-22 DIAGNOSIS — R109 Unspecified abdominal pain: Secondary | ICD-10-CM | POA: Diagnosis present

## 2024-05-22 DIAGNOSIS — N390 Urinary tract infection, site not specified: Secondary | ICD-10-CM | POA: Insufficient documentation

## 2024-05-22 DIAGNOSIS — R9389 Abnormal findings on diagnostic imaging of other specified body structures: Secondary | ICD-10-CM | POA: Diagnosis not present

## 2024-05-22 LAB — URINALYSIS, ROUTINE W REFLEX MICROSCOPIC
Bilirubin Urine: NEGATIVE
Glucose, UA: NEGATIVE mg/dL
Ketones, ur: NEGATIVE mg/dL
Nitrite: NEGATIVE
Protein, ur: NEGATIVE mg/dL
Specific Gravity, Urine: 1.008 (ref 1.005–1.030)
WBC, UA: >50 WBC/hpf (ref 0–5)
pH: 7 (ref 5.0–8.0)

## 2024-05-22 LAB — CBC
HCT: 37.4 % (ref 36.0–46.0)
Hemoglobin: 12.7 g/dL (ref 12.0–15.0)
MCH: 29.7 pg (ref 26.0–34.0)
MCHC: 34 g/dL (ref 30.0–36.0)
MCV: 87.6 fL (ref 80.0–100.0)
Platelets: 320 K/uL (ref 150–400)
RBC: 4.27 MIL/uL (ref 3.87–5.11)
RDW: 12.7 % (ref 11.5–15.5)
WBC: 8.5 K/uL (ref 4.0–10.5)
nRBC: 0 % (ref 0.0–0.2)

## 2024-05-22 LAB — COMPREHENSIVE METABOLIC PANEL WITH GFR
ALT: 12 U/L (ref 0–44)
AST: 24 U/L (ref 15–41)
Albumin: 3.5 g/dL (ref 3.5–5.0)
Alkaline Phosphatase: 58 U/L (ref 38–126)
Anion gap: 17 — ABNORMAL HIGH (ref 5–15)
BUN: 11 mg/dL (ref 8–23)
CO2: 22 mmol/L (ref 22–32)
Calcium: 8.8 mg/dL — ABNORMAL LOW (ref 8.9–10.3)
Chloride: 91 mmol/L — ABNORMAL LOW (ref 98–111)
Creatinine, Ser: 0.68 mg/dL (ref 0.44–1.00)
GFR, Estimated: >60 mL/min (ref >60)
Glucose, Bld: 121 mg/dL — ABNORMAL HIGH (ref 70–99)
Potassium: 3.5 mmol/L (ref 3.5–5.1)
Sodium: 130 mmol/L — ABNORMAL LOW (ref 135–145)
Total Bilirubin: 0.8 mg/dL (ref 0.0–1.2)
Total Protein: 6.8 g/dL (ref 6.5–8.1)

## 2024-05-22 LAB — LIPASE, BLOOD: Lipase: 44 U/L (ref 11–51)

## 2024-05-22 MED ORDER — CEPHALEXIN 500 MG PO CAPS: 500.0000 mg | ORAL_CAPSULE | Freq: Two times a day (BID) | ORAL | 0 refills | Status: DC

## 2024-05-22 MED ORDER — IOHEXOL 300 MG/ML  SOLN
100.0000 mL | Freq: Once | INTRAMUSCULAR | Status: AC | PRN
  Administered 2024-05-22: 100 mL via INTRAVENOUS

## 2024-05-22 MED ORDER — SODIUM CHLORIDE 0.9 % IV SOLN
1.0000 g | INTRAVENOUS | Status: AC
  Administered 2024-05-22: 1 g via INTRAVENOUS
  Filled 2024-05-22: qty 10

## 2024-05-22 MED ORDER — SODIUM CHLORIDE 0.9 % IV BOLUS
500.0000 mL | Freq: Once | INTRAVENOUS | Status: AC
  Administered 2024-05-22: 500 mL via INTRAVENOUS

## 2024-05-22 NOTE — ED Provider Notes (Signed)
 [facility] Provider Note    Event Date/Time   First MD Initiated Contact with Patient 05/22/24 1131     (approximate)   History   Constipation   HPI  Brooke [name] is a 78 y.o. female recently discharged October 10, reviewed discharge summary.  Was noted of hyponatremia and significant stool burden    Patient here also with her daughter.  Reports she has not had a bowel movement for 10 days.  She has been using MiraLAX , and still passing gas but feels like she is starting to become more constipated and has not had a bowel movement for 10 days.  She not having any severe abdominal pain but feels very full again.  No rectal pain or pressure.  Urinating normally, no fevers no chills no vomiting.  Occasional slight nausea.  Ate breakfast this morning occluding a biscuit last night had ham sandwich without issue.  She continues to eat and drink well and is using MiraLAX  daily but no bowel movement  Patient does report she has had frequent urination for the last several days to a week  Discharge Diagnoses:  Active Problems:   Sensorineural hearing loss (SNHL) of right ear with unrestricted hearing of left ear   Anxiety   Major depressive disorder with single episode, in full remission   Cognitive impairment   Hyponatremia   Generalized abdominal pain   Altered mental status   Dehydration   Nausea  Physical Exam   Triage Vital Signs: ED Triage Vitals  Encounter Vitals Group     BP 05/22/24 1055 138/89     Girls Systolic BP Percentile --      Girls Diastolic BP Percentile --      Boys Systolic BP Percentile --      Boys Diastolic BP Percentile --      Pulse Rate 05/22/24 1055 80     Resp 05/22/24 1055 18     Temp 05/22/24 1055 98.7 F (37.1 C)     Temp Source 05/22/24 1055 Oral     SpO2 05/22/24 1055 97 %     Weight 05/22/24 1055 110 lb 3.7 oz (50 kg)     Height 05/22/24 1055 5' 2 (1.575 m)     Head Circumference --      Peak Flow --       Pain Score 05/22/24 1054 9     Pain Loc --      Pain Education --      Exclude from Growth Chart --     Most recent vital signs: Vitals:   05/22/24 1200 05/22/24 1300  BP: (!) 152/106 (!) 173/96  Pulse: 72 70  Resp: 16   Temp:    SpO2: 100% 100%     General: Awake, no distress.  Pleasant no distress CV:  Good peripheral perfusion.  Normal tone Resp:  Normal effort.  Clear bilateral normal work of breathing Abd:  No distention.  Soft, not distended.  No focal tenderness.  Reports a feeling of fullness but no pain or discomfort to palpation.  Does not appear protuberant. Other:  Warm well-perfused lower extremities   ED Results / Procedures / Treatments   Labs (all labs ordered are listed, but only abnormal results are displayed) Labs Reviewed  COMPREHENSIVE METABOLIC PANEL WITH GFR - Abnormal; Notable for the following components:      Result Value   Sodium 130 (*)    Chloride 91 (*)    Glucose, Bld 121 (*)  Calcium  8.8 (*)    Anion gap 17 (*)    All other components within normal limits  URINALYSIS, ROUTINE W REFLEX MICROSCOPIC - Abnormal; Notable for the following components:   Color, Urine YELLOW (*)    APPearance HAZY (*)    Hgb urine dipstick SMALL (*)    Leukocytes,Ua LARGE (*)    Bacteria, UA RARE (*)    All other components within normal limits  URINE CULTURE  LIPASE, BLOOD  CBC    RADIOLOGY  CT ABDOMEN PELVIS W CONTRAST Result Date: 05/22/2024 CLINICAL DATA:  Abdominal pain, acute, nonlocalized abd pain, no bowel movement for 10 days Recent hospitalization for similar symptoms. EXAM: CT ABDOMEN AND PELVIS WITH CONTRAST TECHNIQUE: Multidetector CT imaging of the abdomen and pelvis was performed using the standard protocol following bolus administration of intravenous contrast. RADIATION DOSE REDUCTION: This exam was performed according to the departmental dose-optimization program which includes automated exposure control, adjustment of the mA and/or kV  according to patient size and/or use of iterative reconstruction technique. CONTRAST:  OMNIPAQUE  IOHEXOL  300 MG/ML  SOLN COMPARISON:  Abdominopelvic CT 05/10/2024. Abdominal radiographs 10/16/2023. Chest CTA 10/16/2023. FINDINGS: Lower chest: The left hemidiaphragm is elevated and incompletely visualized on the current study. The diaphragm was moderately elevated on previous chest CTA. Chronic atelectasis at the left lung base. Aortic valvular calcifications with aortic and coronary artery atherosclerosis. Hepatobiliary: The liver is normal in density without suspicious focal abnormality. No evidence of gallstones, gallbladder wall thickening or biliary dilatation. Pancreas: Unremarkable. No pancreatic ductal dilatation or surrounding inflammatory changes. Spleen: The superior aspect of the spleen is incompletely visualized. The spleen appears normal in size and demonstrates no focal abnormality. Adrenals/Urinary Tract: Both adrenal glands appear normal. No evidence of urinary tract calculus, suspicious renal lesion or hydronephrosis. Stable small cyst in the lower interpolar region of the right kidney for which no specific follow-up imaging is recommended. The bladder is decompressed with wall thickening and possible new mucosal hyperenhancement. Stomach/Bowel: No enteric contrast administered. The stomach appears unremarkable for its degree of distension. No evidence of bowel wall thickening, distention or surrounding inflammatory change. The appendix is not visualized, although there is no pericecal inflammation to suggest appendicitis. There are diverticular changes within the descending and sigmoid colon. Mildly prominent stool throughout the colon. Vascular/Lymphatic: There are no enlarged abdominal or pelvic lymph nodes. Aortic and branch vessel atherosclerosis without evidence of aneurysm or large vessel occlusion. Reproductive: Status post hysterectomy.  No adnexal mass. Other: No evidence of  abdominal wall mass or hernia. No ascites or pneumoperitoneum. Musculoskeletal: Convex right thoracolumbar scoliosis with multilevel spondylosis. Degenerative grade 1 anterolisthesis at L4-5. IMPRESSION: 1. The bladder is decompressed with wall thickening and possible new mucosal hyperenhancement which could reflect cystitis. Correlate with urine analysis. 2. No other definite acute findings or explanation for the patient's symptoms. 3. Mildly prominent stool throughout the colon suggesting constipation. No evidence of bowel obstruction or inflammation. 4. Chronic elevation of the left hemidiaphragm with chronic atelectasis at the left lung base, incompletely visualized. 5.  Aortic Atherosclerosis (ICD10-I70.0). Electronically Signed   By: Elsie Perone M.D.   On: 05/22/2024 14:26      PROCEDURES:  Critical Care performed: No  Procedures   MEDICATIONS ORDERED IN ED: Medications  cefTRIAXone  (ROCEPHIN ) 1 g in sodium chloride  0.9 % 100 mL IVPB (has no administration in time range)  sodium chloride  0.9 % bolus 500 mL (0 mLs Intravenous Stopped 05/22/24 1310)  iohexol  (OMNIPAQUE ) 300 MG/ML solution 100  mL (100 mLs Intravenous Contrast Given 05/22/24 1247)     IMPRESSION / MDM / ASSESSMENT AND PLAN / ED COURSE  I reviewed the triage vital signs and the nursing notes.                              Differential diagnosis includes, but is not limited to, possible recurrent constipation, dehydration, ileus, impaction, obstruction etc. also again reconsidered.  She has not any fevers chills she is not actively vomiting no diarrhea still passing gas which is reassuring but again reports both she and daughter no bowel movement for 10 days which is quite a long course.  Could be low slow transit constipation or recurrent severe constipation, will hydrate mild hyponatremia not as severe as previous.  Still eating drinking.  Given her age though and symptoms I suspect repeat imaging will be required to  further delineate.  No neurologic cardiac or vascular abnormalities or concern.  Labs interpreted as mild hyponatremia and hypochloremia minimally elevated anion gap likely in part due to decreased chloride level.  CO2 appropriate arguing against acute acidosis.  Normal CBC without anemia or elevated white count afebrile no symptoms of infection  Urinalysis concerning for UTI  Patient's presentation is most consistent with acute complicated illness / injury requiring diagnostic workup.      Clinical Course as of 05/22/24 1448  Mon May 22, 2024  1434 Given the patient's urinary frequency reported over the last several days, CT findings urinalysis symptoms consistent with uncomplicated UTI.  Previous culture E. coli pansensitive.  Will initiate Rocephin  and discharged with cephalosporin [MQ]  1446 Discussed with the patient, daughter also at bedside.  Discussed that her evaluation demonstrates urinary tract infection.  Patient understanding she has been having frequent urination for the last several days.  Her CT imaging is quite reassuring, some element of constipation but does not have severe findings no obstruction or clot obvious obstipation.  Still passing flatus abdomen soft [MQ]  1447 Daughter reports she would be able to pick up her prescription for antibiotic at [facility] in [place].  I have sent that there.  Daughter attends to her care.  Daughter does have concerns that her mother does not listen well but patient is very comfortable, compliant with care request and [MQ]  1447  comfortable with plan for discharge to continue care at home and follow-up Dr. Herold.  Mild hyponatremia is noted this appears to have some element of chronicity as well she is received IV fluids and I suspect this will likely further correct with hydration and diet.  She is in no distress nontoxic and appropriate for outpatient follow-up.  Careful return precautions discussed with the patient as well as her daughter.  [MQ]    Clinical Course User Index [MQ] Dicky Anes, MD     FINAL CLINICAL IMPRESSION(S) / ED DIAGNOSES   Final diagnoses:  Lower urinary tract infection, acute     Rx / DC Orders   ED Discharge Orders          Ordered    cephALEXin (KEFLEX) 500 MG capsule  2 times daily        05/22/24 1445             Note:  This document was prepared using Dragon voice recognition software and may include unintentional dictation errors.   Dicky Anes, MD 05/22/24 [phone]

## 2024-05-22 NOTE — ED Notes (Signed)
 Patient assisted to the toilet and back to bed without incident.

## 2024-05-22 NOTE — Discharge Instructions (Addendum)
 You have been seen in the Emergency Department (ED) today for pain when urinating.  Your workup today suggests that you have a urinary tract infection (UTI).  Drink plenty of fluid.  Call your regular doctor to schedule the next available appointment to follow up on today's ED visit, or return immediately to the ED if your pain worsens, you have decreased urine production, develop fever, persistent vomiting, or other symptoms that concern you. ----------------------- You were seen in the emergency department today for constipation.  We recommend that you use one or more of the following over-the-counter medications in the order described:   1)  Colace (or Dulcolax) 100 mg:  This is a stool softener, and you may take it once or twice a day as needed. 2)  Senna tablets:  This is a bowel stimulant that will help push out your stool. It is the next step to add after you have tried a stool softener. 3)  Miralax  (powder):  This medication works by drawing additional fluid into your intestines and helps to flush out your stool.  Mix the powder with water or juice according to label instructions.  It may help if the Colace and Senna are not sufficient, but you must be sure to use the recommended amount of water or juice when you mix up the powder. Remember that narcotic pain medications are constipating, so avoid them or minimize their use.  Drink plenty of fluids.  Please return to the Emergency Department immediately if you develop new or worsening symptoms that concern you, such as (but not limited to) fever > 101 degrees, severe abdominal pain, or persistent vomiting.

## 2024-05-22 NOTE — ED Triage Notes (Addendum)
 First Nurse Note: Patient to ED via [facility] from home for constipation. Ongoing x11 days. VS WNL. Recently admitted into the hospital for abd pain w/ N/V- having same today. States has not had BM since dc from hospital.

## 2024-05-22 NOTE — ED Notes (Signed)
 Patient up to toilet without incident and to CT via wheel chair.

## 2024-05-22 NOTE — ED Notes (Signed)
 ED Provider at bedside.

## 2024-05-22 NOTE — ED Notes (Signed)
 Patient back from CT

## 2024-05-24 ENCOUNTER — Encounter: Payer: Self-pay | Admitting: Pediatrics

## 2024-05-25 NOTE — Progress Notes (Signed)
 Scheduled

## 2024-05-26 LAB — URINE CULTURE: Culture: >=100000 — AB

## 2024-05-30 ENCOUNTER — Encounter: Payer: Self-pay | Admitting: Pediatrics

## 2024-05-30 ENCOUNTER — Telehealth: Admitting: Pediatrics

## 2024-05-30 DIAGNOSIS — Z7689 Persons encountering health services in other specified circumstances: Secondary | ICD-10-CM

## 2024-05-30 DIAGNOSIS — N3946 Mixed incontinence: Secondary | ICD-10-CM

## 2024-05-30 DIAGNOSIS — R5381 Other malaise: Secondary | ICD-10-CM | POA: Diagnosis not present

## 2024-05-30 DIAGNOSIS — K582 Mixed irritable bowel syndrome: Secondary | ICD-10-CM | POA: Diagnosis not present

## 2024-05-30 NOTE — Progress Notes (Addendum)
 Telehealth Visit  I connected with  Brooke [name] on 06/13/24 by a video enabled telemedicine application and verified that I am speaking with the correct person using two identifiers.   I discussed the limitations of evaluation and management by telemedicine. The patient expressed understanding and agreed to proceed.  Subjective:    Patient ID: Brooke [name], female    DOB: [birth year], 78 y.o.   MRN: [id number]  HPI: Brooke [name] is a 78 y.o. female  Chief Complaint  Patient presents with   Follow-up    Discussed the use of AI scribe software for clinical note transcription with the patient, who gave verbal consent to proceed.  History of Present Illness   Brooke [name] is a 78 year old female who presents with nausea and loose stools following a recent hospitalization for a urinary tract infection.  She was recently hospitalized for a urinary tract infection, during which she experienced increased urinary frequency and difficulty urinating. She completed a course of antibiotics, and her symptoms related to the UTI have improved.  She currently feels nauseated and is concerned about having diarrhea, although her bowel movements are more loose rather than true diarrhea. She has stopped using a stool softener and has Zofran  at home to manage nausea.  She is eating well and has no issues with constipation at present. There is a mention of a home health program for lab draws and therapy, but there have been delays in its initiation.     Relevant past medical, surgical, family and social history reviewed and updated as indicated. Interim medical history since our last visit reviewed. Allergies and medications reviewed and updated.  ROS per HPI unless specifically indicated above     Objective:    There were no vitals taken for this visit.  Wt Readings from Last 3 Encounters:  05/22/24 110 lb 3.7 oz (50 kg)  05/10/24 110 lb 7.2 oz (50.1 kg)  03/31/24 112 lb 6.4 oz (51  kg)     Physical Exam Constitutional:      General: She is not in acute distress.    Appearance: Normal appearance.  Neurological:     General: No focal deficit present.     Mental Status: She is alert. Mental status is at baseline.     LIMITED EXAM GIVEN VIDEO VISIT     Assessment & Plan:  Assessment & Plan   Irritable bowel syndrome with both constipation and diarrhea Assessment & Plan: Chronic loose stools with recent nausea, likely due to antibiotics. Nausea common post-antibiotic. - Administer Zofran  for nausea as needed.   Mixed stress and urge urinary incontinence Assessment & Plan: Recent hospitalization for UTI with symptom improvement post-antibiotics.  - Complete current antibiotic course.  Debility Ongoing decline in conditioning and ability to safely ambulate at home. Will try to resubmit for home health PT/OT. Would also benefit from nursing eval for any other needs. - Coordinate with home health services for occupational and physical therapy.   Follow up plan: Return in 4 weeks (on 06/27/2024), or if symptoms worsen or fail to improve.  [name], MD   This visit was completed via video visit through MyChart due to the restrictions of the COVID-19 pandemic. All issues as above were discussed and addressed. Physical exam was done as above through visual confirmation on video through MyChart. If it was felt that the patient should be evaluated in the office, they were directed there. The patient verbally consented to this  visit.  Location of the patient: home Location of the provider: work Those involved with this call:  Provider: Hadassah Nett, MD CMA: Cena Maffucci, CMA Time spent on call: 15 minutes with patient face to face via video conference. More than 50% of this time was spent in counseling and coordination of care. 15 minutes total spent in review of patient's record and preparation of their chart. Total time spent on this encounter: 30  minutes.

## 2024-05-30 NOTE — Assessment & Plan Note (Signed)
 Chronic loose stools with recent nausea, likely due to antibiotics. Nausea common post-antibiotic. - Administer Zofran  for nausea as needed.

## 2024-05-30 NOTE — Progress Notes (Signed)
 Scheduled

## 2024-05-30 NOTE — Assessment & Plan Note (Signed)
 Recent hospitalization for UTI with symptom improvement post-antibiotics.  - Complete current antibiotic course.

## 2024-05-31 ENCOUNTER — Other Ambulatory Visit: Payer: Self-pay

## 2024-06-01 ENCOUNTER — Other Ambulatory Visit: Payer: Self-pay

## 2024-06-13 ENCOUNTER — Other Ambulatory Visit (HOSPITAL_COMMUNITY): Payer: Self-pay

## 2024-06-13 ENCOUNTER — Other Ambulatory Visit: Payer: Self-pay

## 2024-06-13 ENCOUNTER — Encounter: Payer: Self-pay | Admitting: Pediatrics

## 2024-06-22 ENCOUNTER — Other Ambulatory Visit: Payer: Self-pay | Admitting: Pediatrics

## 2024-06-22 ENCOUNTER — Other Ambulatory Visit: Payer: Self-pay

## 2024-06-22 ENCOUNTER — Telehealth: Admitting: Pediatrics

## 2024-06-22 ENCOUNTER — Other Ambulatory Visit (HOSPITAL_COMMUNITY): Payer: Self-pay

## 2024-06-22 VITALS — BP 117/75 | HR 84

## 2024-06-22 DIAGNOSIS — Z79899 Other long term (current) drug therapy: Secondary | ICD-10-CM

## 2024-06-22 DIAGNOSIS — R4189 Other symptoms and signs involving cognitive functions and awareness: Secondary | ICD-10-CM | POA: Diagnosis not present

## 2024-06-22 DIAGNOSIS — F419 Anxiety disorder, unspecified: Secondary | ICD-10-CM

## 2024-06-22 DIAGNOSIS — J449 Chronic obstructive pulmonary disease, unspecified: Secondary | ICD-10-CM | POA: Diagnosis not present

## 2024-06-22 DIAGNOSIS — F325 Major depressive disorder, single episode, in full remission: Secondary | ICD-10-CM

## 2024-06-22 DIAGNOSIS — R5381 Other malaise: Secondary | ICD-10-CM

## 2024-06-22 MED ORDER — CLONAZEPAM 0.25 MG PO TBDP
0.2500 mg | ORAL_TABLET | Freq: Two times a day (BID) | ORAL | 0 refills | Status: DC
  Filled 2024-06-22: qty 60, 30d supply, fill #0

## 2024-06-22 MED ORDER — MONTELUKAST SODIUM 10 MG PO TABS: 5.0000 mg | ORAL_TABLET | Freq: Every day | ORAL | Status: DC

## 2024-06-22 NOTE — Progress Notes (Signed)
 Telehealth Visit  I connected with  Brooke [name] on 06/28/24 by a video enabled telemedicine application and verified that I am speaking with the correct person using two identifiers.   I discussed the limitations of evaluation and management by telemedicine. The patient expressed understanding and agreed to proceed.  Subjective:    Patient ID: Brooke [name], female    DOB: 1946/04/24, 78 y.o.   MRN: [id number]  HPI: [name] is a 78 y.o. female  Chief Complaint  Patient presents with   Altered Mental Status    Discussed the use of AI scribe software for clinical note transcription with the patient, who gave verbal consent to proceed.  History of Present Illness   Brooke [name] is a 78 year old female who presents with increased confusion and agitation.  She has been experiencing increased confusion and agitation, characterized by asking for lunch at inappropriate times and becoming agitated when left alone. Paranoia is also present, with questioning about a 'blue pill' she hasn't taken in months.  Her sleep pattern is disrupted, as she goes to bed around 9 PM but often wakes up early with the light on, waiting for her daughter. She describes feeling 'terrible' and 'confused', attributing these sensations to her medication. No headaches are reported, but she feels her head doesn't feel right.  Her appetite is variable, often requesting ham sandwiches and sometimes refusing other foods. Occasionally, she eats meals prepared by her daughter, such as chicken and dumplings with rice, but sometimes prefers just bread or crackers, which her family discourages.  She is currently taking Klonopin , 0.5 mg split into two doses per day, and Singulair , which she has been on for a while.      Relevant past medical, surgical, family and social history reviewed and updated as indicated. Interim medical history since our last visit reviewed. Allergies and medications reviewed and  updated.  ROS per HPI unless specifically indicated above     Objective:    BP 117/75   Pulse 84   SpO2 98%   Wt Readings from Last 3 Encounters:  06/28/24 100 lb 12 oz (45.7 kg)  05/22/24 110 lb 3.7 oz (50 kg)  05/10/24 110 lb 7.2 oz (50.1 kg)     Physical Exam Constitutional:      General: She is not in acute distress.    Appearance: Normal appearance.  Neurological:     General: No focal deficit present.     Mental Status: She is alert. Mental status is at baseline.      LIMITED EXAM GIVEN VIDEO VISIT     Assessment & Plan:  Assessment & Plan    Cognitive impairment Polypharmacy Anxiety Increased confusion and agitation likely due to medication side effects vs progression of dementia. Klonopin  and Singulair  may contribute to symptoms. No UTI or acute confusion noted. Pt refuses in person visit for labs so may also have electrolyte disturbance. Will plan to make the following changes and reassess. - Reduced Klonopin  to 0.25 mg twice daily. - Reduced Singulair  dosage, monitored congestion control. - Coordinated with home health for assessment and support, including bathing assistance and safety evaluation. - Arranged for medical assistant to update on home health progress. - Scheduled follow-up in two months, with earlier visit if needed.  -     clonazePAM ; Take 1 tablet (0.25 mg total) by mouth 2 (two) times daily.  Dispense: 60 tablet; Refill: 0  COPD mixed type (HCC) Reducing in effort to help  w above. -     Montelukast  Sodium; Take 0.5 tablets (5 mg total) by mouth daily.  Debility Ongoing need for home health order. Primary reason for this is documentation for existing order.   Follow up plan: No follow-ups on file.  [name], MD   This visit was completed via video visit through MyChart due to the restrictions of the COVID-19 pandemic. All issues as above were discussed and addressed. Physical exam was done as above through visual confirmation on  video through MyChart. If it was felt that the patient should be evaluated in the office, they were directed there. The patient verbally consented to this visit.  Location of the patient: home Location of the provider: work Those involved with this call:  Provider: Hadassah Nett, MD  Time spent on call: 15 minutes with patient face to face via video conference. More than 50% of this time was spent in counseling and coordination of care. 15 minutes total spent in review of patient's record and preparation of their chart. Total time spent on this encounter: 30 minutes.

## 2024-06-23 ENCOUNTER — Other Ambulatory Visit (HOSPITAL_COMMUNITY): Payer: Self-pay

## 2024-06-24 MED ORDER — ATORVASTATIN CALCIUM 40 MG PO TABS
40.0000 mg | ORAL_TABLET | Freq: Every day | ORAL | 0 refills | Status: DC
  Filled 2024-06-24: qty 30, 30d supply, fill #0
  Filled 2024-08-01: qty 30, 30d supply, fill #1

## 2024-06-24 MED ORDER — LOSARTAN POTASSIUM 25 MG PO TABS
25.0000 mg | ORAL_TABLET | Freq: Two times a day (BID) | ORAL | 0 refills | Status: DC
  Filled 2024-06-24: qty 60, 30d supply, fill #0
  Filled 2024-08-01: qty 60, 30d supply, fill #1

## 2024-06-24 NOTE — Telephone Encounter (Signed)
 Requested Prescriptions  Pending Prescriptions Disp Refills   losartan  (COZAAR ) 25 MG tablet 180 tablet 0    Sig: Take 1 tablet (25 mg total) by mouth in the morning and at bedtime.     Cardiovascular:  Angiotensin Receptor Blockers Passed - 06/24/2024  8:48 AM      Passed - Cr in normal range and within 180 days    Creatinine, Ser  Date Value Ref Range Status  05/22/2024 0.68 0.44 - 1.00 mg/dL Final         Passed - K in normal range and within 180 days    Potassium  Date Value Ref Range Status  05/22/2024 3.5 3.5 - 5.1 mmol/L Final         Passed - Patient is not pregnant      Passed - Last BP in normal range    BP Readings from Last 1 Encounters:  06/22/24 117/75         Passed - Valid encounter within last 6 months    Recent Outpatient Visits           2 days ago COPD mixed type [facility])   [place] [facility] Herold Hadassah SQUIBB, MD   3 weeks ago Irritable bowel syndrome with both constipation and diarrhea   [place] [facility] Herold Hadassah SQUIBB, MD   1 month ago Hyponatremia   [place] [facility] Herold Hadassah SQUIBB, MD   1 month ago Cognitive impairment   [place] [facility] Herold Hadassah SQUIBB, MD   2 months ago Cognitive impairment   [place] [facility] Herold Hadassah SQUIBB, MD               atorvastatin  (LIPITOR) 40 MG tablet 90 tablet 0    Sig: Take 1 tablet (40 mg total) by mouth daily.     Cardiovascular:  Antilipid - Statins Failed - 06/24/2024  8:48 AM      Failed - Lipid Panel in normal range within the last 12 months    Cholesterol, Total  Date Value Ref Range Status  03/31/2024 150 100 - 199 mg/dL Final   LDL Chol Calc (NIH)  Date Value Ref Range Status  03/31/2024 74 0 - 99 mg/dL Final   HDL  Date Value Ref Range Status  03/31/2024 64 >39 mg/dL Final   Triglycerides  Date Value Ref Range Status  03/31/2024 57 0 - 149 mg/dL Final         Passed - Patient is not  pregnant      Passed - Valid encounter within last 12 months    Recent Outpatient Visits           2 days ago COPD mixed type [facility])   [place] [facility] Herold Hadassah SQUIBB, MD   3 weeks ago Irritable bowel syndrome with both constipation and diarrhea   [place] [facility] Herold Hadassah SQUIBB, MD   1 month ago Hyponatremia   [place] [facility] Herold Hadassah SQUIBB, MD   1 month ago Cognitive impairment   [place] [facility] Herold Hadassah SQUIBB, MD   2 months ago Cognitive impairment    [facility] Herold Hadassah SQUIBB, MD

## 2024-06-26 ENCOUNTER — Other Ambulatory Visit: Payer: Self-pay

## 2024-06-26 ENCOUNTER — Inpatient Hospital Stay
Admission: EM | Admit: 2024-06-26 | Discharge: 2024-06-28 | DRG: 641 | Disposition: A | Discharge status: Skilled Nursing Facility | Attending: Internal Medicine | Admitting: Internal Medicine

## 2024-06-26 ENCOUNTER — Emergency Department

## 2024-06-26 DIAGNOSIS — B9689 Other specified bacterial agents as the cause of diseases classified elsewhere: Secondary | ICD-10-CM | POA: Diagnosis present

## 2024-06-26 DIAGNOSIS — H9041 Sensorineural hearing loss, unilateral, right ear, with unrestricted hearing on the contralateral side: Secondary | ICD-10-CM | POA: Diagnosis present

## 2024-06-26 DIAGNOSIS — K5900 Constipation, unspecified: Secondary | ICD-10-CM | POA: Diagnosis present

## 2024-06-26 DIAGNOSIS — E559 Vitamin D deficiency, unspecified: Secondary | ICD-10-CM | POA: Diagnosis present

## 2024-06-26 DIAGNOSIS — E785 Hyperlipidemia, unspecified: Secondary | ICD-10-CM | POA: Diagnosis present

## 2024-06-26 DIAGNOSIS — Z882 Allergy status to sulfonamides status: Secondary | ICD-10-CM

## 2024-06-26 DIAGNOSIS — Z8249 Family history of ischemic heart disease and other diseases of the circulatory system: Secondary | ICD-10-CM

## 2024-06-26 DIAGNOSIS — E861 Hypovolemia: Secondary | ICD-10-CM | POA: Diagnosis present

## 2024-06-26 DIAGNOSIS — R531 Weakness: Principal | ICD-10-CM | POA: Diagnosis present

## 2024-06-26 DIAGNOSIS — Z1152 Encounter for screening for COVID-19: Secondary | ICD-10-CM

## 2024-06-26 DIAGNOSIS — J45909 Unspecified asthma, uncomplicated: Secondary | ICD-10-CM | POA: Diagnosis present

## 2024-06-26 DIAGNOSIS — Z79899 Other long term (current) drug therapy: Secondary | ICD-10-CM

## 2024-06-26 DIAGNOSIS — Z800 Family history of malignant neoplasm of digestive organs: Secondary | ICD-10-CM

## 2024-06-26 DIAGNOSIS — Z888 Allergy status to other drugs, medicaments and biological substances status: Secondary | ICD-10-CM

## 2024-06-26 DIAGNOSIS — Z90711 Acquired absence of uterus with remaining cervical stump: Secondary | ICD-10-CM

## 2024-06-26 DIAGNOSIS — R338 Other retention of urine: Secondary | ICD-10-CM

## 2024-06-26 DIAGNOSIS — E871 Hypo-osmolality and hyponatremia: Principal | ICD-10-CM | POA: Diagnosis present

## 2024-06-26 DIAGNOSIS — R110 Nausea: Secondary | ICD-10-CM | POA: Diagnosis present

## 2024-06-26 DIAGNOSIS — E778 Other disorders of glycoprotein metabolism: Secondary | ICD-10-CM | POA: Diagnosis present

## 2024-06-26 DIAGNOSIS — N390 Urinary tract infection, site not specified: Secondary | ICD-10-CM | POA: Diagnosis present

## 2024-06-26 DIAGNOSIS — R339 Retention of urine, unspecified: Secondary | ICD-10-CM | POA: Diagnosis present

## 2024-06-26 DIAGNOSIS — F325 Major depressive disorder, single episode, in full remission: Secondary | ICD-10-CM | POA: Diagnosis present

## 2024-06-26 DIAGNOSIS — I10 Essential (primary) hypertension: Secondary | ICD-10-CM | POA: Diagnosis present

## 2024-06-26 DIAGNOSIS — R1011 Right upper quadrant pain: Secondary | ICD-10-CM | POA: Diagnosis present

## 2024-06-26 LAB — CBC WITH DIFFERENTIAL/PLATELET
Abs Immature Granulocytes: 0.02 K/uL (ref 0.00–0.07)
Basophils Absolute: 0 K/uL (ref 0.0–0.1)
Basophils Relative: 1 %
Eosinophils Absolute: 0 K/uL (ref 0.0–0.5)
Eosinophils Relative: 0 %
HCT: 33.9 % — ABNORMAL LOW (ref 36.0–46.0)
Hemoglobin: 11.8 g/dL — ABNORMAL LOW (ref 12.0–15.0)
Immature Granulocytes: 0 %
Lymphocytes Relative: 10 %
Lymphs Abs: 0.6 K/uL — ABNORMAL LOW (ref 0.7–4.0)
MCH: 29.6 pg (ref 26.0–34.0)
MCHC: 34.8 g/dL (ref 30.0–36.0)
MCV: 85 fL (ref 80.0–100.0)
Monocytes Absolute: 0.5 K/uL (ref 0.1–1.0)
Monocytes Relative: 9 %
Neutro Abs: 4.7 K/uL (ref 1.7–7.7)
Neutrophils Relative %: 80 %
Platelets: 259 K/uL (ref 150–400)
RBC: 3.99 MIL/uL (ref 3.87–5.11)
RDW: 12.5 % (ref 11.5–15.5)
WBC: 5.9 K/uL (ref 4.0–10.5)
nRBC: 0 % (ref 0.0–0.2)

## 2024-06-26 LAB — COMPREHENSIVE METABOLIC PANEL WITH GFR
ALT: 13 U/L (ref 0–44)
AST: 23 U/L (ref 15–41)
Albumin: 3.9 g/dL (ref 3.5–5.0)
Alkaline Phosphatase: 72 U/L (ref 38–126)
Anion gap: 9 (ref 5–15)
BUN: 6 mg/dL — ABNORMAL LOW (ref 8–23)
CO2: 24 mmol/L (ref 22–32)
Calcium: 8.8 mg/dL — ABNORMAL LOW (ref 8.9–10.3)
Chloride: 91 mmol/L — ABNORMAL LOW (ref 98–111)
Creatinine, Ser: 0.64 mg/dL (ref 0.44–1.00)
GFR, Estimated: >60 mL/min (ref >60)
Glucose, Bld: 108 mg/dL — ABNORMAL HIGH (ref 70–99)
Potassium: 3.9 mmol/L (ref 3.5–5.1)
Sodium: 124 mmol/L — ABNORMAL LOW (ref 135–145)
Total Bilirubin: 0.6 mg/dL (ref 0.0–1.2)
Total Protein: 6.4 g/dL — ABNORMAL LOW (ref 6.5–8.1)

## 2024-06-26 LAB — URINALYSIS, ROUTINE W REFLEX MICROSCOPIC
Bacteria, UA: NONE SEEN
Bilirubin Urine: NEGATIVE
Glucose, UA: NEGATIVE mg/dL
Hgb urine dipstick: NEGATIVE
Ketones, ur: NEGATIVE mg/dL
Nitrite: NEGATIVE
Protein, ur: NEGATIVE mg/dL
Specific Gravity, Urine: 1.01 (ref 1.005–1.030)
WBC, UA: >50 WBC/hpf (ref 0–5)
pH: 7 (ref 5.0–8.0)

## 2024-06-26 LAB — RESP PANEL BY RT-PCR (RSV, FLU A&B, COVID)  RVPGX2
Influenza A by PCR: NEGATIVE
Influenza B by PCR: NEGATIVE
Resp Syncytial Virus by PCR: NEGATIVE
SARS Coronavirus 2 by RT PCR: NEGATIVE

## 2024-06-26 LAB — MAGNESIUM: Magnesium: 1.9 mg/dL (ref 1.7–2.4)

## 2024-06-26 LAB — SODIUM: Sodium: 125 mmol/L — ABNORMAL LOW (ref 135–145)

## 2024-06-26 LAB — TROPONIN T, HIGH SENSITIVITY
Troponin T High Sensitivity: 27 ng/L — ABNORMAL HIGH (ref 0–19)
Troponin T High Sensitivity: 27 ng/L — ABNORMAL HIGH (ref 0–19)

## 2024-06-26 LAB — LACTIC ACID, PLASMA: Lactic Acid, Venous: 0.8 mmol/L (ref 0.5–1.9)

## 2024-06-26 MED ORDER — FLUOXETINE HCL 20 MG PO CAPS
40.0000 mg | ORAL_CAPSULE | Freq: Every morning | ORAL | Status: DC
  Administered 2024-06-27 – 2024-06-28 (×2): 40 mg via ORAL
  Filled 2024-06-26 (×2): qty 2

## 2024-06-26 MED ORDER — SODIUM CHLORIDE 0.9% FLUSH
3.0000 mL | Freq: Two times a day (BID) | INTRAVENOUS | Status: DC
  Administered 2024-06-26 – 2024-06-28 (×5): 3 mL via INTRAVENOUS

## 2024-06-26 MED ORDER — SODIUM CHLORIDE 0.9 % IV BOLUS
500.0000 mL | Freq: Once | INTRAVENOUS | Status: AC
  Administered 2024-06-26: 500 mL via INTRAVENOUS

## 2024-06-26 MED ORDER — ACETAMINOPHEN 325 MG PO TABS
650.0000 mg | ORAL_TABLET | Freq: Four times a day (QID) | ORAL | Status: DC | PRN
Start: 2024-06-26 — End: 2024-06-29
  Administered 2024-06-26 (×2): 650 mg via ORAL
  Filled 2024-06-26 (×2): qty 2

## 2024-06-26 MED ORDER — MONTELUKAST SODIUM 10 MG PO TABS
5.0000 mg | ORAL_TABLET | Freq: Every day | ORAL | Status: DC
  Administered 2024-06-26 – 2024-06-27 (×2): 5 mg via ORAL
  Filled 2024-06-26 (×2): qty 1

## 2024-06-26 MED ORDER — ACETAMINOPHEN 650 MG RE SUPP: 650.0000 mg | Freq: Four times a day (QID) | RECTAL | Status: DC | PRN

## 2024-06-26 MED ORDER — OLANZAPINE 10 MG IM SOLR
5.0000 mg | Freq: Once | INTRAMUSCULAR | Status: AC
  Administered 2024-06-26: 5 mg via INTRAMUSCULAR
  Filled 2024-06-26: qty 10

## 2024-06-26 MED ORDER — CALCIUM GLUCONATE-NACL 1-0.675 GM/50ML-% IV SOLN
1.0000 g | Freq: Once | INTRAVENOUS | Status: AC
  Administered 2024-06-26: 1000 mg via INTRAVENOUS
  Filled 2024-06-26: qty 50

## 2024-06-26 MED ORDER — SENNOSIDES-DOCUSATE SODIUM 8.6-50 MG PO TABS: 1.0000 | ORAL_TABLET | Freq: Every evening | ORAL | Status: DC | PRN

## 2024-06-26 MED ORDER — LOSARTAN POTASSIUM 25 MG PO TABS
25.0000 mg | ORAL_TABLET | Freq: Every day | ORAL | Status: DC
  Administered 2024-06-26 – 2024-06-28 (×3): 25 mg via ORAL
  Filled 2024-06-26 (×3): qty 1

## 2024-06-26 MED ORDER — PANTOPRAZOLE SODIUM 40 MG PO TBEC
40.0000 mg | DELAYED_RELEASE_TABLET | Freq: Every day | ORAL | Status: DC
  Administered 2024-06-26 – 2024-06-28 (×3): 40 mg via ORAL
  Filled 2024-06-26 (×3): qty 1

## 2024-06-26 MED ORDER — SODIUM CHLORIDE 0.9 % IV SOLN
1.0000 g | INTRAVENOUS | Status: DC
  Administered 2024-06-26: 1 g via INTRAVENOUS
  Filled 2024-06-26 (×2): qty 10

## 2024-06-26 MED ORDER — LACTATED RINGERS IV SOLN: INTRAVENOUS | Status: AC

## 2024-06-26 MED ORDER — ENOXAPARIN SODIUM 40 MG/0.4ML IJ SOSY
40.0000 mg | PREFILLED_SYRINGE | INTRAMUSCULAR | Status: DC
  Administered 2024-06-26 – 2024-06-28 (×3): 40 mg via SUBCUTANEOUS
  Filled 2024-06-26 (×4): qty 0.4

## 2024-06-26 MED ORDER — ATORVASTATIN CALCIUM 20 MG PO TABS
40.0000 mg | ORAL_TABLET | Freq: Every day | ORAL | Status: DC
  Administered 2024-06-26 – 2024-06-28 (×3): 40 mg via ORAL
  Filled 2024-06-26 (×3): qty 2

## 2024-06-26 MED ORDER — ONDANSETRON HCL 4 MG/2ML IJ SOLN
4.0000 mg | Freq: Once | INTRAMUSCULAR | Status: AC
  Administered 2024-06-26: 4 mg via INTRAVENOUS
  Filled 2024-06-26: qty 2

## 2024-06-26 MED ORDER — SODIUM CHLORIDE 0.9 % IV BOLUS
1000.0000 mL | Freq: Once | INTRAVENOUS | Status: AC
  Administered 2024-06-26: 1000 mL via INTRAVENOUS

## 2024-06-26 MED ORDER — QUETIAPINE FUMARATE 25 MG PO TABS
25.0000 mg | ORAL_TABLET | Freq: Every day | ORAL | Status: DC
  Administered 2024-06-26 – 2024-06-27 (×2): 25 mg via ORAL
  Filled 2024-06-26 (×2): qty 1

## 2024-06-26 NOTE — Plan of Care (Signed)
 Pts daughter stated via the phone that she is the HCPOA and legal guardian. Pt is very agitated throughout my shift and was given zyprexa  after patient attempting multiple times to get OOB. Patient moved closer to desk and mats placed on the floor. Daughter aware and MD aware.  Problem: Health Behavior/Discharge Planning: Goal: Ability to manage health-related needs will improve Outcome: Not Progressing   Problem: Nutrition: Goal: Adequate nutrition will be maintained Outcome: Not Progressing   Problem: Safety: Goal: Ability to remain free from injury will improve Outcome: Not Progressing   Problem: Activity: Goal: Risk for activity intolerance will decrease Outcome: Progressing

## 2024-06-26 NOTE — ED Triage Notes (Signed)
 Pt arrives via [facility] from home with c/o weakness that started last night and achy with a HA this morning. Daughter is primary caregiver and per EMS daughter stated that pt wasn't their self starting last night. Pt endorses some pain and burning when they urinate that has been going on for a few days. Per EMS, daughter has started seeing signs on dementia but there has been no Dx at this time. Pt is A&Ox3 during triage.

## 2024-06-26 NOTE — ED Notes (Signed)
 Called CCMD to add pt to monitoring.

## 2024-06-26 NOTE — ED Notes (Signed)
 Caled CCMD to add patient to monitor in CPOD

## 2024-06-26 NOTE — ED Notes (Signed)
 Patient continuing to request daughter come back to hospital and continuously stating I dont feel good what are yall doing for me?  Advised we are giving her meds for what she is being admitted for and I spoke with daughter and daughter is not coming back at this time but will like a phone call.

## 2024-06-26 NOTE — H&P (Signed)
 History and Physical    Brooke [name] FMW:[id number] DOB: [birth year] DOA: 06/26/2024  DOS: the patient was seen and examined on 06/26/2024  PCP: Herold Hadassah SQUIBB, MD   Patient coming from: Home  I have personally briefly reviewed patient's old medical records in [facility] and Care[facility]where  HPI:   Brooke [name] is a 78 y.o. year old female with medical history of HTN, HLD, Asthma, MDD and recurrent hyponatremia presenting to the ED with weakness and fatigue.  On chart review patient with previous admissions for hyponatremia with recent admission around 6 weeks ago.   Pt states she has been having nausea that has worsened over the last few days. She has been having poor oral intake. States she has developed weakness and fatigue. States she has been having dysuria over the last 2 days. Denies any coughing, sore throat or URI symptoms.    On arrival to the ED patient was noted to be HDS stable.  Lab work and imaging obtained.  CBC without leukocytosis, mild anemia.  CMP with moderate hyponatremia at 124, normal renal functions, mild hypocalcemia and hypoproteinemia.  Troponin flat.  Lactic acid within normal limits.  UA shows signs of infection.  Urine culture is ordered.  CT head and chest x-ray without acute findings.  Given weakness and fatigue along with moderate hyponatremia, [facility] contacted for admission.  Review of Systems: As mentioned in the history of present illness. All other systems reviewed and are negative.   Past Medical History:  Diagnosis Date   Asthma    Essential hypertension 10/16/2023    Past Surgical History:  Procedure Laterality Date   PARTIAL HYSTERECTOMY     TEMPORAL ARTERY BIOPSY / LIGATION       Allergies  Allergen Reactions   Nystatin  Swelling   Sulfa Antibiotics Rash    Family History  Problem Relation Age of Onset   CAD Father    Colon cancer Father     Prior to Admission medications   Medication Sig Start Date End Date Taking?  Authorizing Provider  atorvastatin  (LIPITOR) 40 MG tablet Take 1 tablet (40 mg total) by mouth daily. 06/24/24   Herold Hadassah SQUIBB, MD  clonazePAM  (KLONOPIN ) 0.25 MG disintegrating tablet Take 1 tablet (0.25 mg total) by mouth 2 (two) times daily. 07/14/24   Herold Hadassah SQUIBB, MD  FLUoxetine  (PROZAC ) 40 MG capsule Take 1 capsule (40 mg total) by mouth every morning. 11/09/23   Herold Hadassah SQUIBB, MD  ipratropium (ATROVENT ) 0.06 % nasal spray Place 1-2 sprays into both nostrils 2 (two) times daily as needed for runny nose. 05/12/23     losartan  (COZAAR ) 25 MG tablet Take 1 tablet (25 mg total) by mouth in the morning and at bedtime. 06/24/24   Herold Hadassah SQUIBB, MD  montelukast  (SINGULAIR ) 10 MG tablet Take 0.5 tablets (5 mg total) by mouth daily. 06/22/24   Herold Hadassah SQUIBB, MD  omeprazole  (PRILOSEC) 20 MG capsule Take 1 capsule (20 mg total) by mouth 2 (two) times daily before a meal 04/27/24   Herold Hadassah SQUIBB, MD  ondansetron  (ZOFRAN ) 4 MG tablet Take 1 tablet (4 mg total) by mouth every 8 (eight) hours as needed for nausea or vomiting. 05/12/24   Lenon Marien CROME, MD  polyethylene glycol (MIRALAX  / GLYCOLAX ) 17 g packet Take 17 g by mouth 2 (two) times daily. 05/12/24   Lenon Marien CROME, MD  Vitamin D , Ergocalciferol , (DRISDOL ) 1.25 MG (50000 UNIT) CAPS capsule Take 1 capsule (50,000 Units  total) by mouth every 7 (seven) days. 04/06/24   Herold Hadassah SQUIBB, MD    Social History:  reports that she has never smoked. She has never used smokeless tobacco. She reports that she does not drink alcohol and does not use drugs. Lives with daughter and son in law. Tobacco- Denies use. EtOH- Denies use.  Illicit drug use- denies use.  IADLs/ADLs- can perform independently at baseline    Physical Exam: Vitals:   06/26/24 1200 06/26/24 1335 06/26/24 1343 06/26/24 1345  BP: (!) 159/93 (!) 159/70  (!) 170/89  Pulse: 61 78  65  Resp: 17 16  17   Temp:   98.1 F (36.7 C)   TempSrc:   Oral   SpO2: 99%     Weight:       Height:       Gen: NAD HENT: NCAT CV: RRR, good pulses in extremities Resp: CTAB Abd: no TTP, normal bowel sounds MSK: no asymmetry, decreased muscle bulk Skin: no lesions on skin Neuro: alert and oriented x4 Psych: depressed mood   Labs on Admission: I have personally reviewed following labs and imaging studies  CBC: Recent Labs  Lab 06/26/24 0938  WBC 5.9  NEUTROABS 4.7  HGB 11.8*  HCT 33.9*  MCV 85.0  PLT 259   Basic Metabolic Panel: Recent Labs  Lab 06/26/24 0938  NA 124*  K 3.9  CL 91*  CO2 24  GLUCOSE 108*  BUN 6*  CREATININE 0.64  CALCIUM  8.8*   GFR: Estimated Creatinine Clearance: 45.8 mL/min (by C-G formula based on SCr of 0.64 mg/dL). Liver Function Tests: Recent Labs  Lab 06/26/24 0938  AST 23  ALT 13  ALKPHOS 72  BILITOT 0.6  PROT 6.4*  ALBUMIN 3.9   No results for input(s): LIPASE, AMYLASE in the last 168 hours. No results for input(s): AMMONIA in the last 168 hours. Coagulation Profile: No results for input(s): INR, PROTIME in the last 168 hours. Cardiac Enzymes: No results for input(s): CKTOTAL, CKMB, CKMBINDEX, TROPONINI, TROPONINIHS in the last 168 hours. BNP (last 3 results) Recent Labs    10/16/23 1852 10/28/23 1153  BNP 481.3* 30.4   HbA1C: No results for input(s): HGBA1C in the last 72 hours. CBG: No results for input(s): GLUCAP in the last 168 hours. Lipid Profile: No results for input(s): CHOL, HDL, LDLCALC, TRIG, CHOLHDL, LDLDIRECT in the last 72 hours. Thyroid  Function Tests: No results for input(s): TSH, T4TOTAL, FREET4, T3FREE, THYROIDAB in the last 72 hours. Anemia Panel: No results for input(s): VITAMINB12, FOLATE, FERRITIN, TIBC, IRON , RETICCTPCT in the last 72 hours. Urine analysis:    Component Value Date/Time   COLORURINE YELLOW (A) 06/26/2024 1004   APPEARANCEUR CLEAR (A) 06/26/2024 1004   APPEARANCEUR Hazy (A) 10/12/2023 1510   LABSPEC  1.010 06/26/2024 1004   PHURINE 7.0 06/26/2024 1004   GLUCOSEU NEGATIVE 06/26/2024 1004   HGBUR NEGATIVE 06/26/2024 1004   BILIRUBINUR NEGATIVE 06/26/2024 1004   BILIRUBINUR Negative 10/12/2023 1510   KETONESUR NEGATIVE 06/26/2024 1004   PROTEINUR NEGATIVE 06/26/2024 1004   NITRITE NEGATIVE 06/26/2024 1004   LEUKOCYTESUR MODERATE (A) 06/26/2024 1004    Radiological Exams on Admission: I have personally reviewed images CT Head Wo Contrast Result Date: 06/26/2024 EXAM: CT HEAD WITHOUT CONTRAST 06/26/2024 12:10:56 PM TECHNIQUE: CT of the head was performed without the administration of intravenous contrast. Automated exposure control, iterative reconstruction, and/or weight based adjustment of the mA/kV was utilized to reduce the radiation dose to as low as reasonably achievable.  COMPARISON: 05/10/2024 CLINICAL HISTORY: Mental status change, unknown cause FINDINGS: BRAIN AND VENTRICLES: No acute hemorrhage. Patchy and confluent decreased attenuation throughout deep and periventricular white matter bilaterally, compatible with chronic microvascular ischemic disease. Chronic bilateral basal ganglia lacunar infarcts. Cerebral ventricle sizes concordant with cerebral volume loss. No extra-axial collection. No mass effect or midline shift. There is tortuosity of the intracranial vertebral arteries and basilar artery with prominent atherosclerosis particularly of the left vertebral artery and the mid basilar artery. 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 There is prominent calcification in the right Cerebellopontine Angle Cistern likely related to atherosclerosis of the basilar artery. There are no findings to suggest extra-axial mass in this region. ORBITS: Bilateral lens replacement. SINUSES: Left sphenoid sinus mucosal thickening. Mild partial bilateral mastoid effusions. SOFT TISSUES AND SKULL: No acute soft tissue abnormality. No skull fracture. Postsurgical changes of right  Retromastoid Craniotomy. IMPRESSION: 1. No acute intracranial abnormality. 2. 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 3. 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 4. Cerebral volume loss. Electronically signed by: Donnice Mania MD 06/26/2024 12:53 PM EST RP Workstation: [id number]   DG Chest Port 1 View Result Date: 06/26/2024 EXAM: 1 VIEW(S) XRAY OF THE CHEST 06/26/2024 09:42:11 AM COMPARISON: 10/16/2023 CLINICAL HISTORY: 78 year old female. Weakness. FINDINGS: LUNGS AND PLEURA: Unchanged elevation of the left hemidiaphragm. No focal pulmonary opacity. No pleural effusion. No pneumothorax. HEART AND MEDIASTINUM: Stable cardiomegaly and tortuosity of the thoracic aorta. BONES AND SOFT TISSUES: No acute osseous abnormality. IMPRESSION: 1. No acute cardiopulmonary abnormality. Electronically signed by: Helayne Hurst MD 06/26/2024 10:00 AM EST RP Workstation: [id number]    EKG: My personal interpretation of EKG shows: Sinus without any acute ST changes.  Assessment/Plan Principal Problem:   Weakness generalized Active Problems:   Hyponatremia   Sensorineural hearing loss (SNHL) of right ear with unrestricted hearing of left ear   Major depressive disorder with single episode, in full remission   Vitamin D  deficiency   Nausea   Pt with generalized weakness likely secondary to multiple etiologies including hyponatremia and UTI. Sodium at 124. Pt status post 1.5 L. Repeating Na now and every 8 hours. Likely secondary to poor oral intake given report of nausea. Treatment of UTI with rocephin  as below. Will treat nausea with prn antiemetics but expect it to improve with treatment of UTI and improvement in hyponatremia.   UTI: UA shows signs of infection and pt with dysuria. Will start rocephin  and follow culture.   HTN: holding home meds given poor intake and UTI.     HLD: continue home med after med rec.    GAD: continue home med after med rec   VTE prophylaxis:  Lovenox   Diet: Regular Code Status:  Full Code Telemetry:  Admission status: Inpatient, Med-Surg Patient is from: Home Anticipated d/c is to: Home Anticipated d/c is in: 2-3 days   Family Communication: Updated at bedside  Consults called: None   Severity of Illness: The appropriate patient status for this patient is OBSERVATION. Observation status is judged to be reasonable and necessary in order to provide the required intensity of service to ensure the patient's safety. The patient's presenting symptoms, physical exam findings, and initial radiographic and laboratory data in the context of their medical condition is felt to place them at decreased risk for further clinical deterioration. Furthermore, it is anticipated that the patient will be medically stable for discharge from the hospital within 2  midnights of admission.    Morene Bathe, MD [facility]

## 2024-06-26 NOTE — ED Notes (Signed)
 Pt up to the toilet in the room with a stand by assist from staff. Pt has a steady, even gait. Pt returned to bed without issue, was reconnected to VS monitoring equipment, with their bed in the lowest, locked position. No other needs at this time.

## 2024-06-26 NOTE — ED Provider Notes (Signed)
 [facility] Provider Note    Event Date/Time   First MD Initiated Contact with Patient 06/26/24 [phone]     (approximate)   History   Weakness   HPI  Brooke [name] is a 78 y.o. female with a history of asthma, hypertension, hyperlipidemia, hyponatremia, and depression who presents with generalized weakness.  The patient states that she is feeling generally bad and weak, like she cannot hold her head up.  She reports pain and aches all over.  She has nausea but no vomiting.  She denies any difficulty breathing.  Per EMS, the patient's symptoms started last night and the patient's daughter felt that she was not acting like her normal self.  She has been having some dysuria.  I reviewed the past medical records.  The patient was admitted in early October with altered mental status and hyponatremia.SABRA  She was subsequently seen in the ED on 10/20 due to constipation.   Physical Exam   Triage Vital Signs: ED Triage Vitals  Encounter Vitals Group     BP      Girls Systolic BP Percentile      Girls Diastolic BP Percentile      Boys Systolic BP Percentile      Boys Diastolic BP Percentile      Pulse      Resp      Temp      Temp src      SpO2      Weight      Height      Head Circumference      Peak Flow      Pain Score      Pain Loc      Pain Education      Exclude from Growth Chart     Most recent vital signs: Vitals:   06/26/24 1130 06/26/24 1200  BP: (!) 163/97 (!) 159/93  Pulse: 69 61  Resp: 18 17  Temp:    SpO2:  99%     General: Alert, oriented x 3, weak appearing but in no distress.  CV:  Good peripheral perfusion.  Resp:  Normal effort.  Lungs CTAB. Abd:  Soft and nontender.  No distention.  Other:  Dry mucous membranes.  No peripheral edema.  EOMI.  PERRLA.  Motor intact in all extremities.   ED Results / Procedures / Treatments   Labs (all labs ordered are listed, but only abnormal results are displayed) Labs Reviewed   COMPREHENSIVE METABOLIC PANEL WITH GFR - Abnormal; Notable for the following components:      Result Value   Sodium 124 (*)    Chloride 91 (*)    Glucose, Bld 108 (*)    BUN 6 (*)    Calcium  8.8 (*)    Total Protein 6.4 (*)    All other components within normal limits  CBC WITH DIFFERENTIAL/PLATELET - Abnormal; Notable for the following components:   Hemoglobin 11.8 (*)    HCT 33.9 (*)    Lymphs Abs 0.6 (*)    All other components within normal limits  URINALYSIS, ROUTINE W REFLEX MICROSCOPIC - Abnormal; Notable for the following components:   Color, Urine YELLOW (*)    APPearance CLEAR (*)    Leukocytes,Ua MODERATE (*)    All other components within normal limits  TROPONIN T, HIGH SENSITIVITY - Abnormal; Notable for the following components:   Troponin T High Sensitivity 27 (*)    All other components within normal limits  TROPONIN T, HIGH SENSITIVITY - Abnormal; Notable for the following components:   Troponin T High Sensitivity 27 (*)    All other components within normal limits  RESP PANEL BY RT-PCR (RSV, FLU A&B, COVID)  [id number]  URINE CULTURE  LACTIC ACID, PLASMA     EKG  ED ECG REPORT I, [name], the attending physician, personally viewed and interpreted this ECG.  Date: 06/26/2024 EKG Time: 0930 Rate: 75 Rhythm: normal sinus rhythm QRS Axis: Borderline left axis Intervals: normal ST/T Wave abnormalities: Nonspecific T wave abnormalities Narrative Interpretation: no evidence of acute ischemia    RADIOLOGY  Chest x-ray: I independently viewed and interpreted the images; there is no focal consolidation or edema  CT head: No ICH or other acute abnormality  PROCEDURES:  Critical Care performed: No  Procedures   MEDICATIONS ORDERED IN ED: Medications  sodium chloride  0.9 % bolus 500 mL (0 mLs Intravenous Stopped 06/26/24 1057)  sodium chloride  0.9 % bolus 1,000 mL (0 mLs Intravenous Stopped 06/26/24 1312)     IMPRESSION / MDM /  ASSESSMENT AND PLAN / ED COURSE  I reviewed the triage vital signs and the nursing notes.  78 year old female with PMH as noted above presents with generalized weakness since last night along with some dysuria.  She does not appear to be acutely altered.  On exam she is alert and oriented x 3 with hypertension but otherwise normal vital signs.  Neurologic exam is nonfocal.  Mucous membranes are dry.  She appears generally weak.  Differential diagnosis includes, but is not limited to, UTI, COVID, flu, or other viral syndrome, pneumonia or other acute infection, dehydration, electrolyte abnormality, other metabolic cause, less likely cardiac etiology.  There is no evidence of CNS cause.  We will obtain lab workup, chest x-ray, give fluids, and reassess.  Patient's presentation is most consistent with acute presentation with potential threat to life or bodily function.  The patient is on the cardiac monitor to evaluate for evidence of arrhythmia and/or significant heart rate changes.  ----------------------------------------- 1:31 PM on 06/26/2024 -----------------------------------------  Lab workup significant for sodium of 124.  CBC shows no acute findings.  Lactate is negative.  Urinalysis shows greater than 50 WBCs but no bacteria or nitrates.  This is somewhat equivocal for UTI.  Troponin is minimally elevated.  Respiratory panel is negative.  CT head was obtained which shows no acute abnormalities.  Overall presentation is most consistent with weakness due to the hyponatremia.  I have ordered an additional fluids.  The patient will need inpatient admission for further management.  I consulted Dr. Nelida from the hospitalist service; based on our discussion he agrees to evaluate the patient for admission.   FINAL CLINICAL IMPRESSION(S) / ED DIAGNOSES   Final diagnoses:  Weakness  Hyponatremia     Rx / DC Orders   ED Discharge Orders     None        Note:  This document was  prepared using Dragon voice recognition software and may include unintentional dictation errors.    Cassis Waylon, MD 06/26/24 [phone]

## 2024-06-27 ENCOUNTER — Other Ambulatory Visit: Payer: Self-pay

## 2024-06-27 ENCOUNTER — Inpatient Hospital Stay

## 2024-06-27 DIAGNOSIS — R110 Nausea: Secondary | ICD-10-CM | POA: Diagnosis not present

## 2024-06-27 DIAGNOSIS — R531 Weakness: Secondary | ICD-10-CM | POA: Diagnosis not present

## 2024-06-27 DIAGNOSIS — H9041 Sensorineural hearing loss, unilateral, right ear, with unrestricted hearing on the contralateral side: Secondary | ICD-10-CM | POA: Diagnosis not present

## 2024-06-27 DIAGNOSIS — E871 Hypo-osmolality and hyponatremia: Secondary | ICD-10-CM | POA: Diagnosis not present

## 2024-06-27 DIAGNOSIS — E559 Vitamin D deficiency, unspecified: Secondary | ICD-10-CM | POA: Diagnosis not present

## 2024-06-27 DIAGNOSIS — R338 Other retention of urine: Secondary | ICD-10-CM | POA: Diagnosis not present

## 2024-06-27 LAB — CBC
HCT: 31.1 % — ABNORMAL LOW (ref 36.0–46.0)
Hemoglobin: 10.5 g/dL — ABNORMAL LOW (ref 12.0–15.0)
MCH: 29.6 pg (ref 26.0–34.0)
MCHC: 33.8 g/dL (ref 30.0–36.0)
MCV: 87.6 fL (ref 80.0–100.0)
Platelets: 208 K/uL (ref 150–400)
RBC: 3.55 MIL/uL — ABNORMAL LOW (ref 3.87–5.11)
RDW: 12.5 % (ref 11.5–15.5)
WBC: 5.1 K/uL (ref 4.0–10.5)
nRBC: 0 % (ref 0.0–0.2)

## 2024-06-27 LAB — BASIC METABOLIC PANEL WITH GFR
Anion gap: 6 (ref 5–15)
BUN: 6 mg/dL — ABNORMAL LOW (ref 8–23)
CO2: 25 mmol/L (ref 22–32)
Calcium: 8.5 mg/dL — ABNORMAL LOW (ref 8.9–10.3)
Chloride: 100 mmol/L (ref 98–111)
Creatinine, Ser: 0.69 mg/dL (ref 0.44–1.00)
GFR, Estimated: >60 mL/min (ref >60)
Glucose, Bld: 84 mg/dL (ref 70–99)
Potassium: 4 mmol/L (ref 3.5–5.1)
Sodium: 131 mmol/L — ABNORMAL LOW (ref 135–145)

## 2024-06-27 LAB — GLUCOSE, CAPILLARY: Glucose-Capillary: 97 mg/dL (ref 70–99)

## 2024-06-27 LAB — SODIUM
Sodium: 129 mmol/L — ABNORMAL LOW (ref 135–145)
Sodium: 132 mmol/L — ABNORMAL LOW (ref 135–145)

## 2024-06-27 MED ORDER — ENSURE PLUS HIGH PROTEIN PO LIQD
237.0000 mL | Freq: Two times a day (BID) | ORAL | Status: DC
  Administered 2024-06-27 – 2024-06-28 (×3): 237 mL via ORAL

## 2024-06-27 MED ORDER — TAMSULOSIN HCL 0.4 MG PO CAPS
0.4000 mg | ORAL_CAPSULE | Freq: Every day | ORAL | Status: DC
  Administered 2024-06-27 – 2024-06-28 (×2): 0.4 mg via ORAL
  Filled 2024-06-27 (×2): qty 1

## 2024-06-27 MED ORDER — CHLORHEXIDINE GLUCONATE CLOTH 2 % EX PADS
6.0000 | MEDICATED_PAD | Freq: Every day | CUTANEOUS | Status: DC
  Administered 2024-06-28 (×2): 6 via TOPICAL

## 2024-06-27 MED ORDER — CLONAZEPAM 0.5 MG PO TABS
0.5000 mg | ORAL_TABLET | Freq: Two times a day (BID) | ORAL | Status: DC
  Administered 2024-06-27 – 2024-06-28 (×3): 0.5 mg via ORAL
  Filled 2024-06-27 (×3): qty 1

## 2024-06-27 MED ORDER — ONDANSETRON HCL 4 MG/2ML IJ SOLN
4.0000 mg | Freq: Four times a day (QID) | INTRAMUSCULAR | Status: DC | PRN
  Administered 2024-06-27: 4 mg via INTRAVENOUS
  Filled 2024-06-27: qty 2

## 2024-06-27 MED ORDER — SODIUM CHLORIDE 0.9 % IV SOLN: 1.0000 g | Freq: Two times a day (BID) | INTRAVENOUS | Status: DC

## 2024-06-27 MED ORDER — SODIUM CHLORIDE 0.9 % IV SOLN
2.0000 g | Freq: Every day | INTRAVENOUS | Status: DC
  Administered 2024-06-27 – 2024-06-28 (×2): 2 g via INTRAVENOUS
  Filled 2024-06-27 (×2): qty 12.5

## 2024-06-27 NOTE — Evaluation (Signed)
 Physical Therapy Evaluation Patient Details Name: Brooke [name] MRN: [id number] DOB: 1946/07/05 Today's Date: 06/27/2024  History of Present Illness  Brooke [name] is a 78 y.o. year old female with medical history of HTN, HLD, Asthma, MDD and recurrent hyponatremia presenting to the ED with weakness and fatigue.  On chart review patient with previous admissions for hyponatremia with recent admission around 6 weeks ago.  Clinical Impression  Patient is agreeable to PT evaluation. She is confused but cooperative. Per reports she lives at home with daughter.  Today the patient required assistance for standing and short distance ambulation. She reports a history of falls. Activity tolerance limited by weakness and fatigue with minimal activity. Recommend to continue PT to maximize independence and facilitate return to prior level of function. Anticipate patient can benefit from rehabilitation < 3 hours/day after this hospital stay.       If plan is discharge home, recommend the following: A little help with walking and/or transfers;A little help with bathing/dressing/bathroom;Assistance with cooking/housework;Help with stairs or ramp for entrance;Supervision due to cognitive status   Can travel by private vehicle   No    Equipment Recommendations None recommended by PT  Recommendations for Other Services       Functional Status Assessment Patient has had a recent decline in their functional status and demonstrates the ability to make significant improvements in function in a reasonable and predictable amount of time.     Precautions / Restrictions Precautions Precautions: Fall Recall of Precautions/Restrictions: Impaired Restrictions Weight Bearing Restrictions Per Provider Order: No      Mobility  Bed Mobility               General bed mobility comments: not assessed as patient sitting up on arrival and post session    Transfers Overall transfer level: Needs  assistance Equipment used: 1 person hand held assist Transfers: Sit to/from Stand Sit to Stand: Min assist           General transfer comment: lifting assistance required for standing.    Ambulation/Gait Ambulation/Gait assistance: Min assist Gait Distance (Feet): 5 Feet Assistive device: 1 person hand held assist Gait Pattern/deviations: Decreased stride length Gait velocity: decreased     General Gait Details: patient declined walking further, reporting need to sit due to fatigue. steadying assistance required  Stairs            Wheelchair Mobility     Tilt Bed    Modified Rankin (Stroke Patients Only)       Balance Overall balance assessment: Needs assistance Sitting-balance support: Feet supported, No upper extremity supported Sitting balance-Leahy Scale: Fair     Standing balance support: Single extremity supported Standing balance-Leahy Scale: Poor Standing balance comment: external support required                             Pertinent Vitals/Pain Pain Assessment Pain Assessment: PAINAD Breathing: normal Negative Vocalization: none Facial Expression: sad, frightened, frown Body Language: relaxed Consolability: no need to console PAINAD Score: 1 Pain Intervention(s): Limited activity within patient's tolerance    Home Living Family/patient expects to be discharged to:: Private residence Living Arrangements: Children Available Help at Discharge: Family Type of Home: House Home Access: Stairs to enter Entrance Stairs-Rails: None Entrance Stairs-Number of Steps:  a few   Home Layout: One level Home Equipment: Agricultural Consultant (2 wheels);Cane - single point;BSC/3in1 Additional Comments: pt reports recently moved in with her daughter,daughter not  present in room at time of assessment.    Prior Function Prior Level of Function : Needs assist             Mobility Comments: SPC in the home, rare community mobility ADLs  Comments: Pt reports she performs dressing, feeding, grooming, toileting with MOD I, reports increased difficulties with transfer into shower so has been doing sponge baths     Extremity/Trunk Assessment   Upper Extremity Assessment Upper Extremity Assessment: Generalized weakness    Lower Extremity Assessment Lower Extremity Assessment: Generalized weakness    Cervical / Trunk Assessment Cervical / Trunk Assessment: Kyphotic  Communication   Communication Communication: Impaired Factors Affecting Communication: Hearing impaired    Cognition Arousal: Alert Behavior During Therapy: Anxious                             Following commands: Impaired Following commands impaired: Follows multi-step commands with increased time     Cueing Cueing Techniques: Verbal cues, Gestural cues, Tactile cues, Visual cues     General Comments      Exercises     Assessment/Plan    PT Assessment Patient needs continued PT services  PT Problem List Decreased strength;Decreased range of motion;Decreased balance;Decreased activity tolerance;Decreased mobility;Decreased safety awareness;Decreased cognition       PT Treatment Interventions DME instruction;Gait training;Stair training;Functional mobility training;Therapeutic activities;Therapeutic exercise;Balance training;Neuromuscular re-education;Cognitive remediation;Patient/family education;Wheelchair mobility training    PT Goals (Current goals can be found in the Care Plan section)  Acute Rehab PT Goals Patient Stated Goal: to go home PT Goal Formulation: With patient Time For Goal Achievement: 07/11/24 Potential to Achieve Goals: Fair    Frequency Min 1X/week     Co-evaluation               AM-PAC PT 6 Clicks Mobility  Outcome Measure Help needed turning from your back to your side while in a flat bed without using bedrails?: None Help needed moving from lying on your back to sitting on the side of a flat  bed without using bedrails?: A Little Help needed moving to and from a bed to a chair (including a wheelchair)?: A Little Help needed standing up from a chair using your arms (e.g., wheelchair or bedside chair)?: A Little Help needed to walk in hospital room?: A Little Help needed climbing 3-5 steps with a railing? : A Lot 6 Click Score: 18    End of Session   Activity Tolerance: Patient limited by fatigue Patient left: in chair;with call bell/phone within reach;with chair alarm set   PT Visit Diagnosis: Muscle weakness (generalized) (M62.81);Unsteadiness on feet (R26.81)    Time: 8972-8964 PT Time Calculation (min) (ACUTE ONLY): 8 min   Charges:   PT Evaluation $PT Eval Low Complexity: 1 Low   PT General Charges $$ ACUTE PT VISIT: 1 Visit         Randine Essex, PT, MPT   Randine LULLA Essex 06/27/2024, 12:32 PM

## 2024-06-27 NOTE — Progress Notes (Signed)
 Physical Therapy Treatment Patient Details Name: Brooke [name] MRN: [id number] DOB: 04/09/1946 Today's Date: 06/27/2024   History of Present Illness Brooke [name] is a 78 y.o. year old female with medical history of HTN, HLD, Asthma, MDD and recurrent hyponatremia presenting to the ED with weakness and fatigue.  On chart review patient with previous admissions for hyponatremia with recent admission around 6 weeks ago.    PT Comments  Responded to patient's chair alarm going off and patient requesting to get up. She was willing to progress activity and ambulation this session. Patient walked without assistive device and with rolling walker. Although she needs cues to use the rolling walker, gait pattern and balance is improved with assistive device which is recommended for safety and routine mobility with staff. Continue to recommend rehabilitation < 3 hours/day after this hospital stay.    If plan is discharge home, recommend the following: A little help with walking and/or transfers;A little help with bathing/dressing/bathroom;Assistance with cooking/housework;Help with stairs or ramp for entrance;Supervision due to cognitive status   Can travel by private vehicle     No  Equipment Recommendations  None recommended by PT    Recommendations for Other Services       Precautions / Restrictions Precautions Precautions: Fall Recall of Precautions/Restrictions: Impaired Restrictions Weight Bearing Restrictions Per Provider Order: No     Mobility  Bed Mobility Overal bed mobility: Needs Assistance Bed Mobility: Sit to Supine       Sit to supine: Contact guard assist   General bed mobility comments: increased time and cues for task initiation    Transfers Overall transfer level: Needs assistance Equipment used: 1 person hand held assist, Rolling walker (2 wheels) Transfers: Sit to/from Stand Sit to Stand: Min assist           General transfer comment: steadying  assistance required    Ambulation/Gait Ambulation/Gait assistance: Min assist Gait Distance (Feet):  (76ft, 64ft) Assistive device: Rolling walker (2 wheels), 1 person hand held assist Gait Pattern/deviations: Decreased stride length Gait velocity: decreased     General Gait Details: patient walked with and without rolling walker. gait pattern and balance is improved using rolling walker compared to no device.cues required to use the rolling walker as patient is easily distracted and leaves the walker behind with dual tasks   Stairs             Wheelchair Mobility     Tilt Bed    Modified Rankin (Stroke Patients Only)       Balance Overall balance assessment: Needs assistance Sitting-balance support: Feet supported, No upper extremity supported Sitting balance-Leahy Scale: Fair     Standing balance support: Single extremity supported Standing balance-Leahy Scale: Poor Standing balance comment: external support provided with standing at sink to wash hands                            Communication Communication Communication: Impaired Factors Affecting Communication: Hearing impaired  Cognition Arousal: Alert Behavior During Therapy: Anxious   PT - Cognitive impairments: History of cognitive impairments                         Following commands: Impaired Following commands impaired: Follows multi-step commands with increased time    Cueing Cueing Techniques: Verbal cues, Gestural cues, Tactile cues, Visual cues  Exercises      General Comments General comments (skin integrity, edema, etc.): responded  to alarm going off in the room as patient trying to stand without assistance. proceeded with PT treatment session as patient now willing to progress mobility and ambulate with assistance. recommend rolling walker for ambulation for safety      Pertinent Vitals/Pain Pain Assessment Pain Assessment: No/denies pain Breathing:  normal Negative Vocalization: none Facial Expression: sad, frightened, frown Body Language: relaxed Consolability: no need to console PAINAD Score: 1 Pain Intervention(s): Limited activity within patient's tolerance    Home Living Family/patient expects to be discharged to:: Private residence Living Arrangements: Children Available Help at Discharge: Family Type of Home: House Home Access: Stairs to enter Entrance Stairs-Rails: None Entrance Stairs-Number of Steps:  a few   Home Layout: One level Home Equipment: Agricultural Consultant (2 wheels);Cane - single point;BSC/3in1      Prior Function            PT Goals (current goals can now be found in the care plan section) Acute Rehab PT Goals Patient Stated Goal: to go home PT Goal Formulation: With patient Time For Goal Achievement: 07/11/24 Potential to Achieve Goals: Fair Progress towards PT goals: Progressing toward goals    Frequency    Min 1X/week      PT Plan      Co-evaluation              AM-PAC PT 6 Clicks Mobility   Outcome Measure  Help needed turning from your back to your side while in a flat bed without using bedrails?: None Help needed moving from lying on your back to sitting on the side of a flat bed without using bedrails?: A Little Help needed moving to and from a bed to a chair (including a wheelchair)?: A Little Help needed standing up from a chair using your arms (e.g., wheelchair or bedside chair)?: A Little Help needed to walk in hospital room?: A Little Help needed climbing 3-5 steps with a railing? : A Lot 6 Click Score: 18    End of Session   Activity Tolerance: Patient tolerated treatment well Patient left: in bed;with call bell/phone within reach;with bed alarm set Nurse Communication: Mobility status PT Visit Diagnosis: Muscle weakness (generalized) (M62.81);Unsteadiness on feet (R26.81)     Time: 8570-8560 PT Time Calculation (min) (ACUTE ONLY): 10 min  Charges:     $Therapeutic Activity: 8-22 mins PT General Charges $$ ACUTE PT VISIT: 1 Visit                     Randine Essex, PT, MPT    Randine LULLA Essex 06/27/2024, 3:14 PM

## 2024-06-27 NOTE — NC FL2 (Deleted)
 [place]  MEDICAID [id number] LEVEL OF CARE FORM     IDENTIFICATION  Patient Name: Brooke [name] Birthdate: Aug 14, 1945 Sex: female Admission Date (Current Location): 06/26/2024  [facility] and [place] Number:  Chiropodist and Address:  [facility], [address], [place], [place] [address]      Provider Number: [id number]  Attending Physician Name and Address:  Lanetta Lingo, MD  Relative Name and Phone Number:  Brooke [name] (Daughter)  [phone]    Current Level of Care: Hospital Recommended Level of Care: Skilled Nursing Facility Prior Approval Number:    Date Approved/Denied:   PASRR Number:    Discharge Plan: SNF    Current Diagnoses: Patient Active Problem List   Diagnosis Date Noted   Weakness generalized 06/26/2024   Generalized abdominal pain 05/12/2024   Altered mental status 05/12/2024   Dehydration 05/12/2024   Nausea 05/12/2024   Hyponatremia 05/10/2024   Vitamin D  deficiency 04/06/2024   Cognitive impairment 03/14/2024   Ascending aortic aneurysm 10/19/2023   Non-ST elevation (NSTEMI) myocardial infarction (HCC) 10/17/2023   Hiatal hernia 10/16/2023   Iron  deficiency 10/16/2023   Polypharmacy 05/05/2022   Irritable bowel syndrome with both constipation and diarrhea 11/12/2020   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   Sensorineural hearing loss (SNHL) of right ear with unrestricted hearing of left ear 02/15/2017   Trigeminal neuralgia 10/28/2015   Anxiety 12/28/2012   Major depressive disorder with single episode, in full remission 12/28/2012   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/14/2010    Orientation RESPIRATION BLADDER Height & Weight     Self, Place    Incontinent Weight: 53 kg Height:  5' 2 (157.5 cm)  BEHAVIORAL SYMPTOMS/MOOD NEUROLOGICAL BOWEL NUTRITION STATUS      Incontinent Diet (Regular)  AMBULATORY STATUS COMMUNICATION OF NEEDS Skin   Extensive Assist Verbally                          Personal Care Assistance Level of Assistance  Bathing, Dressing, Total care Bathing Assistance: Maximum assistance   Dressing Assistance: Maximum assistance Total Care Assistance: Maximum assistance   Functional Limitations Info             SPECIAL CARE FACTORS FREQUENCY  PT (By licensed PT), OT (By licensed OT)     PT Frequency: 5 x week OT Frequency: 5 x week            Contractures      Additional Factors Info  Code Status, Allergies Code Status Info: FULL Allergies Info: Nystatin , Sulfas           Current Medications (06/27/2024):  This is the current hospital active medication list Current Facility-Administered Medications  Medication Dose Route Frequency Provider Last Rate Last Admin   acetaminophen  (TYLENOL ) tablet 650 mg  650 mg Oral Q6H PRN Fernand Prost, MD   650 mg at 06/26/24 2316   Or   acetaminophen  (TYLENOL ) suppository 650 mg  650 mg Rectal Q6H PRN Khan, Ghalib, MD       atorvastatin  (LIPITOR) tablet 40 mg  40 mg Oral Daily Khan, Ghalib, MD   40 mg at 06/27/24 1003   ceFEPIme  (MAXIPIME ) 2 g in sodium chloride  0.9 % 100 mL IVPB  2 g Intravenous Daily Agbata, Tochukwu, MD 200 mL/hr at 06/27/24 1330 2 g at 06/27/24 1330   clonazePAM  (KLONOPIN ) tablet 0.5 mg  0.5 mg Oral BID Agbata, Tochukwu, MD       enoxaparin  (  LOVENOX ) injection 40 mg  40 mg Subcutaneous Q24H Khan, Ghalib, MD   40 mg at 06/27/24 1208   feeding supplement (ENSURE PLUS HIGH PROTEIN) liquid 237 mL  237 mL Oral BID BM Agbata, Tochukwu, MD   237 mL at 06/27/24 1007   FLUoxetine  (PROZAC ) capsule 40 mg  40 mg Oral q morning Khan, Ghalib, MD   40 mg at 06/27/24 1003   lactated ringers  infusion   Intravenous Continuous Fernand Prost, MD   Stopped at 06/27/24 1229   losartan  (COZAAR ) tablet 25 mg  25 mg Oral Daily Khan, Ghalib, MD   25 mg at 06/27/24 1003   montelukast  (SINGULAIR ) tablet 5 mg  5 mg Oral QHS Khan, Ghalib, MD   5 mg at 06/26/24 2210   ondansetron  (ZOFRAN ) injection 4 mg  4 mg  Intravenous Q6H PRN Agbata, Tochukwu, MD   4 mg at 06/27/24 1312   pantoprazole  (PROTONIX ) EC tablet 40 mg  40 mg Oral Daily Khan, Ghalib, MD   40 mg at 06/27/24 1003   QUEtiapine  (SEROQUEL ) tablet 25 mg  25 mg Oral QHS Duncan, Hazel V, MD   25 mg at 06/26/24 2210   senna-docusate (Senokot-S) tablet 1 tablet  1 tablet Oral QHS PRN Fernand Prost, MD       sodium chloride  flush (NS) 0.9 % injection 3 mL  3 mL Intravenous Q12H Khan, Ghalib, MD   3 mL at 06/27/24 1110     Discharge Medications: Please see discharge summary for a list of discharge medications.  Relevant Imaging Results:  Relevant Lab Results:   Additional Information [id number]  Dalia GORMAN Fuse, RN

## 2024-06-27 NOTE — NC FL2 (Deleted)
 [place]  MEDICAID [id number] LEVEL OF CARE FORM     IDENTIFICATION  Patient Name: Brooke [name] Birthdate: Aug 14, 1945 Sex: female Admission Date (Current Location): 06/26/2024  [facility] and [place] Number:  Chiropodist and Address:  [facility], [address], [place], [place] [address]      Provider Number: [id number]  Attending Physician Name and Address:  Lanetta Lingo, MD  Relative Name and Phone Number:  Tacey Palma (Daughter)  [phone]    Current Level of Care: Hospital Recommended Level of Care: Skilled Nursing Facility Prior Approval Number:    Date Approved/Denied:   PASRR Number:    Discharge Plan: SNF    Current Diagnoses: Patient Active Problem List   Diagnosis Date Noted   Weakness generalized 06/26/2024   Generalized abdominal pain 05/12/2024   Altered mental status 05/12/2024   Dehydration 05/12/2024   Nausea 05/12/2024   Hyponatremia 05/10/2024   Vitamin D  deficiency 04/06/2024   Cognitive impairment 03/14/2024   Ascending aortic aneurysm 10/19/2023   Non-ST elevation (NSTEMI) myocardial infarction (HCC) 10/17/2023   Hiatal hernia 10/16/2023   Iron  deficiency 10/16/2023   Polypharmacy 05/05/2022   Irritable bowel syndrome with both constipation and diarrhea 11/12/2020   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   Sensorineural hearing loss (SNHL) of right ear with unrestricted hearing of left ear 02/15/2017   Trigeminal neuralgia 10/28/2015   Anxiety 12/28/2012   Major depressive disorder with single episode, in full remission 12/28/2012   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/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    Orientation RESPIRATION BLADDER Height & Weight     Self, Place    Incontinent Weight: 53 kg Height:  5' 2 (157.5 cm)  BEHAVIORAL SYMPTOMS/MOOD NEUROLOGICAL BOWEL NUTRITION STATUS      Incontinent Diet (Regular)  AMBULATORY STATUS COMMUNICATION OF NEEDS Skin   Extensive Assist Verbally                          Personal Care Assistance Level of Assistance  Bathing, Dressing, Total care Bathing Assistance: Maximum assistance   Dressing Assistance: Maximum assistance Total Care Assistance: Maximum assistance   Functional Limitations Info             SPECIAL CARE FACTORS FREQUENCY  PT (By licensed PT), OT (By licensed OT)     PT Frequency: 5 x week OT Frequency: 5 x week            Contractures      Additional Factors Info  Code Status, Allergies Code Status Info: FULL Allergies Info: Nystatin , Sulfas           Current Medications (06/27/2024):  This is the current hospital active medication list Current Facility-Administered Medications  Medication Dose Route Frequency Provider Last Rate Last Admin   acetaminophen  (TYLENOL ) tablet 650 mg  650 mg Oral Q6H PRN Fernand Prost, MD   650 mg at 06/26/24 2316   Or   acetaminophen  (TYLENOL ) suppository 650 mg  650 mg Rectal Q6H PRN Khan, Ghalib, MD       atorvastatin  (LIPITOR) tablet 40 mg  40 mg Oral Daily Khan, Ghalib, MD   40 mg at 06/27/24 1003   ceFEPIme  (MAXIPIME ) 2 g in sodium chloride  0.9 % 100 mL IVPB  2 g Intravenous Daily Agbata, Tochukwu, MD 200 mL/hr at 06/27/24 1330 2 g at 06/27/24 1330   clonazePAM  (KLONOPIN ) tablet 0.5 mg  0.5 mg Oral BID Agbata, Tochukwu, MD       enoxaparin  (  LOVENOX ) injection 40 mg  40 mg Subcutaneous Q24H Khan, Ghalib, MD   40 mg at 06/27/24 1208   feeding supplement (ENSURE PLUS HIGH PROTEIN) liquid 237 mL  237 mL Oral BID BM Agbata, Tochukwu, MD   237 mL at 06/27/24 1007   FLUoxetine  (PROZAC ) capsule 40 mg  40 mg Oral q morning Khan, Ghalib, MD   40 mg at 06/27/24 1003   lactated ringers  infusion   Intravenous Continuous Fernand Prost, MD   Stopped at 06/27/24 1229   losartan  (COZAAR ) tablet 25 mg  25 mg Oral Daily Khan, Ghalib, MD   25 mg at 06/27/24 1003   montelukast  (SINGULAIR ) tablet 5 mg  5 mg Oral QHS Khan, Ghalib, MD   5 mg at 06/26/24 2210   ondansetron  (ZOFRAN ) injection 4 mg  4 mg  Intravenous Q6H PRN Agbata, Tochukwu, MD   4 mg at 06/27/24 1312   pantoprazole  (PROTONIX ) EC tablet 40 mg  40 mg Oral Daily Khan, Ghalib, MD   40 mg at 06/27/24 1003   QUEtiapine  (SEROQUEL ) tablet 25 mg  25 mg Oral QHS Duncan, Hazel V, MD   25 mg at 06/26/24 2210   senna-docusate (Senokot-S) tablet 1 tablet  1 tablet Oral QHS PRN Fernand Prost, MD       sodium chloride  flush (NS) 0.9 % injection 3 mL  3 mL Intravenous Q12H Khan, Ghalib, MD   3 mL at 06/27/24 1110     Discharge Medications: Please see discharge summary for a list of discharge medications.  Relevant Imaging Results:  Relevant Lab Results:   Additional Information [id number]  Dalia GORMAN Fuse, RN

## 2024-06-27 NOTE — Progress Notes (Addendum)
 Progress Note   Patient: Brooke [name] FMW:[id number] DOB: 1946-05-13 DOA: 06/26/2024     0 DOS: the patient was seen and examined on 06/27/2024   Brief hospital course:  Brooke [name] is a 78 y.o. year old female with medical history of HTN, HLD, Asthma, MDD and recurrent hyponatremia presenting to the ED with weakness and fatigue.  On chart review patient with previous admissions for hyponatremia with recent admission around 6 weeks ago.     Pt states she has been having nausea that has worsened over the last few days. She has been having poor oral intake. States she has developed weakness and fatigue. States she has been having dysuria over the last 2 days. Denies any coughing, sore throat or URI symptoms.      On arrival to the ED patient was noted to be HDS stable.  Lab work and imaging obtained.  CBC without leukocytosis, mild anemia.  CMP with moderate hyponatremia at 124, normal renal functions, mild hypocalcemia and hypoproteinemia.  Troponin flat.  Lactic acid within normal limits.  UA shows signs of infection.  Urine culture is ordered.  CT head and chest x-ray without acute findings.  Given weakness and fatigue along with moderate hyponatremia, TRH contacted for admission.   Review of Systems: As mentioned in the history of present illness. All other systems reviewed and are negative.   Assessment and Plan:   Abdominal pain (right upper quadrant) Patient presents to the ER for evaluation of poor oral intake due to nausea and abdominal pain mostly in the right upper quadrant Liver enzymes are within normal limits Obtain right upper quadrant ultrasound   Hyponatremia Most likely hypovolemic related to poor oral intake and concomitant SSRI use Sodium levels on admission was 125 and has improved with IV fluid hydration to 132 Monitor closely   Urinary tract infection Patient noted to have pyuria on admission Prior urine culture yielded Pseudomonas 10/15 Switch Rocephin   to Fortaz for empiric therapy until culture results become available    Hypertension Continue Cozaar    Depression Continue fluoxetine  and Seroquel     Acute urinary retention Patient had about 700 cc of urine in her bladder following a bladder scan and initially had an In-N-Out cath done earlier in the day Will insert Foley catheter Start patient on Flomax  0.4 mg daily      Subjective: Complains of not feeling well.  Noted to have tenderness in the right upper quadrant area associated with nausea  Physical Exam: Vitals:   06/26/24 2011 06/26/24 2030 06/27/24 0450 06/27/24 0812  BP: 103/61  107/68 (!) 142/85  Pulse: (!) 111 89 73 81  Resp: 16  17 14   Temp: 97.7 F (36.5 C)  98 F (36.7 C) 98.9 F (37.2 C)  TempSrc: Oral  Oral Oral  SpO2: 99%  99% 94%  Weight:      Height:       Gen: Chronically ill-appearing HENT: Pale conjunctiva CV: RRR, good pulses in extremities Resp: CTAB/L Abd: Bowel sounds present, right upper quadrant tenderness MSK: no asymmetry, decreased muscle bulk Skin: no lesions on skin Neuro: alert and oriented x4 Psych: depressed mood, flat affect   Data Reviewed: Sodium 131, hemoglobin 10.5, hematocrit 31.1 Labs reviewed  Family Communication: Discussed plan of care over the phone with patient's daughter.  All questions and concerns have been addressed.  She verbalizes understanding and agrees with the plan.  She requests for her mother to be discharged to skilled nursing facility for rehab  Disposition: Status is: Observation The patient remains OBS appropriate and will d/c before 2 midnights.  Planned Discharge Destination: TBD    Time spent: 40 minutes  Author: Aimee Somerset, MD 06/27/2024 11:35 AM  For on call review [email or web address].

## 2024-06-27 NOTE — TOC Initial Note (Signed)
 Transition of Care [facility]) - Initial/Assessment Note    Patient Details  Name: Brooke [name] MRN: [id number] Date of Birth: January 11, 1946  Transition of Care [facility]) CM/SW Contact:    Dalia GORMAN Fuse, RN Phone Number: 06/27/2024, 3:02 PM  Clinical Narrative:                  TOC met with the patient in the room, she was confused and agitated. The patient gave [facility] permission to speak with her daughter, Alan [phone], about discharge planning. The patient is from home with her adult daughter. She relies on her daughter for ADLs and IADLS. Her daughter is hoping that STR will help the patient be more at her baseline and not so wobbly when ambulating. She would like TOC to send the FL2 out in [place]  TOC sent FL2 to facilities in [place] and requested PASRR.   Expected Discharge Plan: Skilled Nursing Facility Barriers to Discharge: Continued Medical Work up   Patient Goals and CMS Choice     Choice offered to / list presented to : Adult Children      Expected Discharge Plan and Services   Discharge Planning Services: CM Consult Post Acute Care Choice: Skilled Nursing Facility Living arrangements for the past 2 months: Single Family Home                                      Prior Living Arrangements/Services Living arrangements for the past 2 months: Single Family Home Lives with:: Adult Children                   Activities of Daily Living   ADL Screening (condition at time of admission) Independently performs ADLs?: No Does the patient have a NEW difficulty with bathing/dressing/toileting/self-feeding that is expected to last >3 days?: Yes (Initiates electronic notice to provider for possible OT consult) Does the patient have a NEW difficulty with getting in/out of bed, walking, or climbing stairs that is expected to last >3 days?: Yes (Initiates electronic notice to provider for possible PT consult) Does the patient have a NEW difficulty with  communication that is expected to last >3 days?: No Is the patient deaf or have difficulty hearing?: 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) Does the patient have difficulty seeing, even when wearing glasses/contacts?: Yes (reders) Does the patient have difficulty concentrating, remembering, or making decisions?: Yes  Permission Sought/Granted                  Emotional Assessment              Admission diagnosis:  Hyponatremia [E87.1] Weakness generalized [R53.1] Weakness [R53.1] Patient Active Problem List   Diagnosis Date Noted   Weakness generalized 06/26/2024   Generalized abdominal pain 05/12/2024   Altered mental status 05/12/2024   Dehydration 05/12/2024   Nausea 05/12/2024   Hyponatremia 05/10/2024   Vitamin D  deficiency 04/06/2024   Cognitive impairment 03/14/2024   Ascending aortic aneurysm 10/19/2023   Non-ST elevation (NSTEMI) myocardial infarction (HCC) 10/17/2023   Hiatal hernia 10/16/2023   Iron  deficiency 10/16/2023   Polypharmacy 05/05/2022   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 [place] [address] Phone: [phone] Fax: [phone]     Social Drivers of Health (SDOH) Social History: SDOH Screenings   Food Insecurity: No Food Insecurity (06/26/2024)  Housing: Low Risk  (06/26/2024)  Transportation Needs: No Transportation Needs (06/26/2024)  Utilities: Not At Risk (05/11/2024)  Alcohol Screen: Low Risk  (06/02/2023)  Depression (PHQ2-9): High Risk (03/31/2024)  Financial Resource Strain: Low Risk   (06/22/2024)  Physical Activity: Inactive (06/22/2024)  Social Connections: Socially Isolated (06/26/2024)  Stress: Stress Concern Present (06/22/2024)  Tobacco Use: Low Risk  (06/26/2024)  Health Literacy: Adequate Health Literacy (06/02/2023)   SDOH Interventions:     Readmission Risk Interventions     No data to display

## 2024-06-27 NOTE — NC FL2 (Signed)
 [place]  MEDICAID [id number] LEVEL OF CARE FORM     IDENTIFICATION  Patient Name: Brooke [name] Birthdate: 1945-09-03 Sex: female Admission Date (Current Location): 06/26/2024  [facility] and [place] Number:  Chiropodist and Address:  [facility], [address] [place], [place] [address]      Provider Number: [id number]  Attending Physician Name and Address:  Lanetta Lingo, MD  Relative Name and Phone Number:  Tacey Palma (Daughter)  [phone]    Current Level of Care: Hospital Recommended Level of Care: Skilled Nursing Facility Prior Approval Number:    Date Approved/Denied:   PASRR Number:  [id number]  Discharge Plan: SNF    Current Diagnoses: Patient Active Problem List   Diagnosis Date Noted   Weakness generalized 06/26/2024   Generalized abdominal pain 05/12/2024   Altered mental status 05/12/2024   Dehydration 05/12/2024   Nausea 05/12/2024   Hyponatremia 05/10/2024   Vitamin D  deficiency 04/06/2024   Cognitive impairment 03/14/2024   Ascending aortic aneurysm 10/19/2023   Non-ST elevation (NSTEMI) myocardial infarction (HCC) 10/17/2023   Hiatal hernia 10/16/2023   Iron  deficiency 10/16/2023   Polypharmacy 05/05/2022   Irritable bowel syndrome with both constipation and diarrhea 11/12/2020   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   Sensorineural hearing loss (SNHL) of right ear with unrestricted hearing of left ear 02/15/2017   Trigeminal neuralgia 10/28/2015   Anxiety 12/28/2012   Major depressive disorder with single episode, in full remission 12/28/2012   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/14/2010    Orientation RESPIRATION BLADDER Height & Weight     Self, Place    Incontinent Weight: 53 kg Height:  5' 2 (157.5 cm)  BEHAVIORAL SYMPTOMS/MOOD NEUROLOGICAL BOWEL NUTRITION STATUS      Incontinent Diet (Regular)  AMBULATORY STATUS COMMUNICATION OF NEEDS Skin   Extensive Assist Verbally                          Personal Care Assistance Level of Assistance  Bathing, Dressing, Total care Bathing Assistance: Maximum assistance   Dressing Assistance: Maximum assistance Total Care Assistance: Maximum assistance   Functional Limitations Info             SPECIAL CARE FACTORS FREQUENCY  PT (By licensed PT), OT (By licensed OT)     PT Frequency: 5 x week OT Frequency: 5 x week            Contractures      Additional Factors Info  Code Status, Allergies Code Status Info: FULL Allergies Info: Nystatin , Sulfas           Current Medications (06/27/2024):  This is the current hospital active medication list Current Facility-Administered Medications  Medication Dose Route Frequency Provider Last Rate Last Admin   acetaminophen  (TYLENOL ) tablet 650 mg  650 mg Oral Q6H PRN Khan, Ghalib, MD   650 mg at 06/26/24 2316   Or   acetaminophen  (TYLENOL ) suppository 650 mg  650 mg Rectal Q6H PRN Khan, Ghalib, MD       atorvastatin  (LIPITOR) tablet 40 mg  40 mg Oral Daily Khan, Ghalib, MD   40 mg at 06/27/24 1003   ceFEPIme  (MAXIPIME ) 2 g in sodium chloride  0.9 % 100 mL IVPB  2 g Intravenous Daily Agbata, Tochukwu, MD 200 mL/hr at 06/27/24 1330 2 g at 06/27/24 1330   clonazePAM  (KLONOPIN ) tablet 0.5 mg  0.5 mg Oral BID Lanetta Lingo, MD       enoxaparin  (  LOVENOX ) injection 40 mg  40 mg Subcutaneous Q24H Fernand Prost, MD   40 mg at 06/27/24 1208   feeding supplement (ENSURE PLUS HIGH PROTEIN) liquid 237 mL  237 mL Oral BID BM Agbata, Tochukwu, MD   237 mL at 06/27/24 1007   FLUoxetine  (PROZAC ) capsule 40 mg  40 mg Oral q morning Khan, Ghalib, MD   40 mg at 06/27/24 1003   lactated ringers  infusion   Intravenous Continuous Fernand Prost, MD   Stopped at 06/27/24 1229   losartan  (COZAAR ) tablet 25 mg  25 mg Oral Daily Khan, Ghalib, MD   25 mg at 06/27/24 1003   montelukast  (SINGULAIR ) tablet 5 mg  5 mg Oral QHS Fernand Prost, MD   5 mg at 06/26/24 2210   ondansetron  (ZOFRAN ) injection 4 mg   4 mg Intravenous Q6H PRN Agbata, Tochukwu, MD   4 mg at 06/27/24 1312   pantoprazole  (PROTONIX ) EC tablet 40 mg  40 mg Oral Daily Khan, Ghalib, MD   40 mg at 06/27/24 1003   QUEtiapine  (SEROQUEL ) tablet 25 mg  25 mg Oral QHS Duncan, Hazel V, MD   25 mg at 06/26/24 2210   senna-docusate (Senokot-S) tablet 1 tablet  1 tablet Oral QHS PRN Fernand Prost, MD       sodium chloride  flush (NS) 0.9 % injection 3 mL  3 mL Intravenous Q12H Khan, Ghalib, MD   3 mL at 06/27/24 1110     Discharge Medications: Please see discharge summary for a list of discharge medications.  Relevant Imaging Results:  Relevant Lab Results:   Additional Information [id number]  Dalia GORMAN Fuse, RN

## 2024-06-27 NOTE — Evaluation (Signed)
 Occupational Therapy Evaluation Patient Details Name: Brooke [name] MRN: [id number] DOB: 01/27/1946 Today's Date: 06/27/2024   History of Present Illness   Brooke [name] is a 78 y.o. year old female with medical history of HTN, HLD, Asthma, MDD and recurrent hyponatremia presenting to the ED with weakness and fatigue.  On chart review patient with previous admissions for hyponatremia with recent admission around 6 weeks ago.     Clinical Impressions Pt was seen for OT evaluation this date. Prior to hospital admission, pt was requiring assistance for all ADLs/IADLs due to cognition and unsteadiness. PLOF and home setup is obtained from daughter via phone call with chartered loss adjuster.  Pt alert and oriented to self only. Pt lives with her daughter who reports she had MS and is no long able to provided care for the patient. PTA pt daughter reports the pt typically used a SPC for in home ambulation however, she has been falling frequently and having trouble getting up from from chairs/surfaces. Pt presents with deficits in decreased Ind in self care, balance, functional mobility/transfers, activity tolerance, and safety awareness affecting safe and optimal ADL completion. Pt currently requires bilateral UE support for transfers/ambulation (IV pole and 1 HHA) and frequent verbal cues for sequencing and safety awareness. Pt would benefit from skilled OT services to address noted impairments and functional limitations (see below for any additional details) in order to maximize safety and independence while minimizing future risk of falls, injury, and readmission. OT will follow acutely.     If plan is discharge home, recommend the following:   A lot of help with walking and/or transfers;A lot of help with bathing/dressing/bathroom;Assistance with cooking/housework;Direct supervision/assist for medications management;Direct supervision/assist for financial management;Assist for transportation;Help with stairs or  ramp for entrance;Supervision due to cognitive status     Functional Status Assessment   Patient has had a recent decline in their functional status and demonstrates the ability to make significant improvements in function in a reasonable and predictable amount of time.     Equipment Recommendations   Other (comment) (Defer to next venue of care)     Recommendations for Other Services         Precautions/Restrictions   Precautions Precautions: Fall Recall of Precautions/Restrictions: Impaired Restrictions Weight Bearing Restrictions Per Provider Order: No     Mobility Bed Mobility Overal bed mobility: Needs Assistance Bed Mobility: Supine to Sit     Supine to sit: Contact guard     General bed mobility comments: Verbal cues for hand placement for ease of trasnfer    Transfers Overall transfer level: Needs assistance Equipment used: 1 person hand held assist (IV pole) Transfers: Sit to/from Stand Sit to Stand: Contact guard assist           General transfer comment: CGA STS from lowest bed height, mild unsteady during ambulation within the room      Balance Overall balance assessment: Needs assistance Sitting-balance support: Single extremity supported, Feet supported Sitting balance-Leahy Scale: Fair     Standing balance support: Bilateral upper extremity supported, During functional activity Standing balance-Leahy Scale: Poor                             ADL either performed or assessed with clinical judgement   ADL Overall ADL's : Needs assistance/impaired Eating/Feeding: Set up;Sitting   Grooming: Wash/dry face;Wash/dry hands;Sitting;Cueing for sequencing;Set up  Lower Body Dressing: Sitting/lateral leans;Minimal assistance   Toilet Transfer: Ambulation;Rolling walker (2 wheels);Minimal assistance;Cueing for safety Toilet Transfer Details (indicate cue type and reason): Uses IV pole for support and 1 HHA  during toileting transfer Toileting- Clothing Manipulation and Hygiene: Contact guard assist;Sitting/lateral lean;Cueing for safety;Cueing for sequencing Toileting - Clothing Manipulation Details (indicate cue type and reason): Verbal cues for task completion and sequencing     Functional mobility during ADLs: Minimal assistance (IV pole) General ADL Comments: Requires frequnt verbal/tacile cues for ADL completion for sequencing of tasks and problemsolving                                Pertinent Vitals/Pain Pain Assessment Pain Assessment: PAINAD Breathing: normal Negative Vocalization: occasional moan/groan, low speech, negative/disapproving quality Facial Expression: sad, frightened, frown Body Language: relaxed Consolability: no need to console PAINAD Score: 2 Pain Intervention(s): Limited activity within patient's tolerance, Repositioned     Extremity/Trunk Assessment Upper Extremity Assessment Upper Extremity Assessment: Generalized weakness   Lower Extremity Assessment Lower Extremity Assessment: Defer to PT evaluation;Generalized weakness   Cervical / Trunk Assessment Cervical / Trunk Assessment: Kyphotic   Communication Communication Communication: Impaired Factors Affecting Communication: Hearing impaired   Cognition Arousal: Alert Behavior During Therapy: Anxious Cognition: History of cognitive impairments, Cognition impaired   Orientation impairments: Situation, Time, Place Awareness: Intellectual awareness impaired, Online awareness impaired Memory impairment (select all impairments): Short-term memory, Declarative long-term memory Attention impairment (select first level of impairment): Sustained attention Executive functioning impairment (select all impairments): Reasoning, Problem solving, Sequencing, Organization OT - Cognition Comments: A/Ox1 self only                 Following commands: Impaired Following commands impaired: Follows  multi-step commands with increased time     Cueing  General Comments   Cueing Techniques: Verbal cues;Gestural cues;Tactile cues;Visual cues      Exercises Exercises: Other exercises Other Exercises Other Exercises: Edu: Role of OT session, safe ADL completion, DME needs, phone call with daughter to discuss discharge planning   Shoulder Instructions      Home Living Family/patient expects to be discharged to:: Private residence Living Arrangements: Children Available Help at Discharge: Family Type of Home: House Home Access: Stairs to enter [employer] of Steps:  a few Entrance Stairs-Rails: None Home Layout: One level     Bathroom Shower/Tub: Tub/shower unit         Home Equipment: Agricultural Consultant (2 wheels);Cane - single point;BSC/3in1   Additional Comments: pt reports recently moved in with her daughter,daughter not present in room at time of assessment.      Prior Functioning/Environment Prior Level of Function : Needs assist;Patient poor historian/Family not available             Mobility Comments: Pt reports use of SPC in the house, rarely goes into community for amb ADLs Comments: Pt reports she performs dressing, feeding, grooming, toileting with MOD I, reports increased difficulties with transfer into shower so has been doing sponge baths    OT Problem List: Decreased strength;Decreased activity tolerance;Impaired balance (sitting and/or standing);Decreased cognition;Decreased coordination;Decreased safety awareness;Decreased knowledge of use of DME or AE;Decreased knowledge of precautions   OT Treatment/Interventions: Self-care/ADL training;Therapeutic exercise;Energy conservation;DME and/or AE instruction;Therapeutic activities;Patient/family education;Balance training      OT Goals(Current goals can be found in the care plan section)   Acute Rehab OT Goals OT Goal Formulation: Patient unable to participate in goal  setting Time For  Goal Achievement: 07/11/24 Potential to Achieve Goals: Good ADL Goals Pt Will Perform Grooming: with supervision;standing Pt Will Perform Lower Body Dressing: with supervision;sitting/lateral leans Pt Will Transfer to Toilet: with supervision;ambulating Pt Will Perform Toileting - Clothing Manipulation and hygiene: with supervision;sitting/lateral leans   OT Frequency:  Min 2X/week    Co-evaluation              AM-PAC OT 6 Clicks Daily Activity     Outcome Measure Help from another person eating meals?: A Little Help from another person taking care of personal grooming?: A Little Help from another person toileting, which includes using toliet, bedpan, or urinal?: A Little Help from another person bathing (including washing, rinsing, drying)?: A Lot Help from another person to put on and taking off regular upper body clothing?: A Little Help from another person to put on and taking off regular lower body clothing?: A Little 6 Click Score: 17   End of Session Equipment Utilized During Treatment: Gait belt;Other (comment) (IV pole) Nurse Communication: Mobility status  Activity Tolerance: Patient tolerated treatment well Patient left: in chair;with call bell/phone within reach;with chair alarm set  OT Visit Diagnosis: Unsteadiness on feet (R26.81);Other abnormalities of gait and mobility (R26.89);Repeated falls (R29.6);Muscle weakness (generalized) (M62.81);Other symptoms and signs involving cognitive function                Time: 0940-1002 OT Time Calculation (min): 22 min Charges:  OT General Charges $OT Visit: 1 Visit OT Evaluation $OT Eval Low Complexity: 1 Low OT Treatments $Self Care/Home Management : 8-22 mins Larraine Colas M.S. OTR/L  06/27/24, 11:39 AM

## 2024-06-28 ENCOUNTER — Ambulatory Visit: Admitting: Pediatrics

## 2024-06-28 ENCOUNTER — Other Ambulatory Visit: Payer: Self-pay

## 2024-06-28 ENCOUNTER — Encounter: Payer: Self-pay | Admitting: Pediatrics

## 2024-06-28 DIAGNOSIS — E559 Vitamin D deficiency, unspecified: Secondary | ICD-10-CM

## 2024-06-28 DIAGNOSIS — R110 Nausea: Secondary | ICD-10-CM

## 2024-06-28 DIAGNOSIS — R338 Other retention of urine: Secondary | ICD-10-CM

## 2024-06-28 DIAGNOSIS — R531 Weakness: Secondary | ICD-10-CM

## 2024-06-28 DIAGNOSIS — H9041 Sensorineural hearing loss, unilateral, right ear, with unrestricted hearing on the contralateral side: Secondary | ICD-10-CM

## 2024-06-28 DIAGNOSIS — E871 Hypo-osmolality and hyponatremia: Principal | ICD-10-CM

## 2024-06-28 LAB — URINE CULTURE: Culture: 40000 — AB

## 2024-06-28 LAB — CBC
HCT: 30.6 % — ABNORMAL LOW (ref 36.0–46.0)
Hemoglobin: 10.2 g/dL — ABNORMAL LOW (ref 12.0–15.0)
MCH: 29.3 pg (ref 26.0–34.0)
MCHC: 33.3 g/dL (ref 30.0–36.0)
MCV: 87.9 fL (ref 80.0–100.0)
Platelets: 226 K/uL (ref 150–400)
RBC: 3.48 MIL/uL — ABNORMAL LOW (ref 3.87–5.11)
RDW: 13 % (ref 11.5–15.5)
WBC: 5.7 K/uL (ref 4.0–10.5)
nRBC: 0 % (ref 0.0–0.2)

## 2024-06-28 LAB — BASIC METABOLIC PANEL WITH GFR
Anion gap: 7 (ref 5–15)
BUN: 7 mg/dL — ABNORMAL LOW (ref 8–23)
CO2: 26 mmol/L (ref 22–32)
Calcium: 8.5 mg/dL — ABNORMAL LOW (ref 8.9–10.3)
Chloride: 99 mmol/L (ref 98–111)
Creatinine, Ser: 0.79 mg/dL (ref 0.44–1.00)
GFR, Estimated: >60 mL/min (ref >60)
Glucose, Bld: 83 mg/dL (ref 70–99)
Potassium: 4.1 mmol/L (ref 3.5–5.1)
Sodium: 132 mmol/L — ABNORMAL LOW (ref 135–145)

## 2024-06-28 LAB — GLUCOSE, CAPILLARY
Glucose-Capillary: 93 mg/dL (ref 70–99)
Glucose-Capillary: 154 mg/dL — ABNORMAL HIGH (ref 70–99)
Glucose-Capillary: 137 mg/dL — ABNORMAL HIGH (ref 70–99)

## 2024-06-28 MED ORDER — FOSFOMYCIN TROMETHAMINE 3 G PO PACK
3.0000 g | PACK | Freq: Once | ORAL | Status: DC
  Filled 2024-06-28: qty 3

## 2024-06-28 MED ORDER — ENSURE PLUS HIGH PROTEIN PO LIQD: 237.0000 mL | Freq: Two times a day (BID) | ORAL | Status: DC

## 2024-06-28 MED ORDER — QUETIAPINE FUMARATE 25 MG PO TABS: 25.0000 mg | ORAL_TABLET | Freq: Every day | ORAL | Status: DC

## 2024-06-28 MED ORDER — FOSFOMYCIN TROMETHAMINE 3 G PO PACK
3.0000 g | PACK | Freq: Once | ORAL | Status: AC
  Administered 2024-06-28: 3 g via ORAL
  Filled 2024-06-28: qty 3

## 2024-06-28 MED ORDER — FOSFOMYCIN TROMETHAMINE 3 G PO PACK
3.0000 g | PACK | Freq: Once | ORAL | Status: DC
Start: 2024-06-29 — End: 2024-06-28

## 2024-06-28 MED ORDER — ACETAMINOPHEN 325 MG PO TABS: 650.0000 mg | ORAL_TABLET | Freq: Four times a day (QID) | ORAL | Status: DC | PRN

## 2024-06-28 MED ORDER — TAMSULOSIN HCL 0.4 MG PO CAPS: 0.4000 mg | ORAL_CAPSULE | Freq: Every day | ORAL | Status: DC

## 2024-06-28 NOTE — Progress Notes (Signed)
 Mobility Specialist Progress Note:    06/28/24 1038  Mobility  Activity Ambulated with assistance;Stood at bedside;Pivoted/transferred from bed to chair  Level of Assistance Minimal assist, patient does 75% or more  Assistive Device Front wheel walker  Distance Ambulated (ft) 6 ft  Range of Motion/Exercises Active;All extremities  Activity Response Tolerated well  Mobility visit 1 Mobility  Mobility Specialist Start Time (ACUTE ONLY) 1010  Mobility Specialist Stop Time (ACUTE ONLY) 1032  Mobility Specialist Time Calculation (min) (ACUTE ONLY) 22 min   Pt received in bed, only oriented to person and place. Required MinA to stand and transfer with RW. Tolerated well, deferred further ambulation d/t pt requiring many verbal cues. Alarm on, belongings in reach and nurse notified. All needs met.  Sherrilee Ditty Mobility Specialist Please contact via Special Educational Needs Teacher or  Rehab office at [phone]

## 2024-06-28 NOTE — Discharge Summary (Signed)
 Physician Discharge Summary   Patient: Brooke [name] MRN: [id number] DOB: 08-Dec-1945  Admit date:     06/26/2024  Discharge date: 06/28/24  Discharge Physician: Brooke [name]   PCP: Brooke Hadassah SQUIBB, MD   Recommendations at discharge:  Please obtain CBC and BMP on follow-up Please discuss with PCP regarding continuation of Prozac  as it might be causing hyponatremia. Follow-up with primary care provider Follow-up with urology  Discharge Diagnoses: Principal Problem:   Weakness generalized Active Problems:   Hyponatremia   Sensorineural hearing loss (SNHL) of right ear with unrestricted hearing of left ear   Major depressive disorder with single episode, in full remission   Vitamin D  deficiency   Nausea  Resolved Problems:   * No resolved hospital problems. *  Hospital Course: Brooke [name] is a 78 y.o. year old female with medical history of HTN, HLD, Asthma, MDD and recurrent hyponatremia presenting to the ED with weakness and fatigue. On chart review patient with previous admissions for hyponatremia with recent admission around 6 weeks ago.   On presentation hemodynamically stable, labs concerning for hyponatremia with sodium at 124 which responded well to normal saline and improved to 132 the next day.  Rest of the labs at baseline.  UA with concerning of UTI, patient recently received treatment for UTI with Pseudomonas and Enterococcus faecalis.  Repeat urine cultures with 40,000 colonies of gram-negative rods, patient received cefepime  for 2 days and was given a dose of fosfomycin on the day of discharge.  No urinary symptoms.  Her weakness is most likely secondary to hyponatremia.  Patient is on Prozac  at home that can be contributory.  She need to discuss with her PCP or psychiatrist to see the need for continuation of Prozac .  We are continuing at this time.  Patient can add some salt to her diet to help.  She was complaining of some upper abdominal pain, RUQ ultrasound was  normal.  Liver enzymes were normal.  Patient was also found to have urinary retention and Foley catheter was placed after attempting In-N-Out catheter.  Patient was started on Flomax  and is being discharged with Foley in place, she need to follow-up with urology as outpatient for further assistance.  Patient will continue with the rest of her home medications and follow-up with her providers for further assistance.   Consultants: None Procedures performed: None Disposition: Skilled nursing facility Diet recommendation:  Discharge Diet Orders (From admission, onward)     Start     Ordered   06/28/24 0000  Diet - low sodium heart healthy        06/28/24 1420           Regular diet DISCHARGE MEDICATION: Allergies as of 06/28/2024       Reactions   Nystatin  Swelling   Sulfa Antibiotics Rash        Medication List     TAKE these medications    acetaminophen  325 MG tablet Commonly known as: TYLENOL  Take 2 tablets (650 mg total) by mouth every 6 (six) hours as needed for mild pain (pain score 1-3) or fever (or Fever >/= 101).   atorvastatin  40 MG tablet Commonly known as: LIPITOR Take 1 tablet (40 mg total) by mouth daily.   clonazePAM  0.25 MG disintegrating tablet Commonly known as: KLONOPIN  Take 1 tablet (0.25 mg total) by mouth 2 (two) times daily. Start taking on: July 14, 2024   feeding supplement Liqd Take 237 mLs by mouth 2 (two) times daily between meals.  Start taking on: June 29, 2024   FLUoxetine  40 MG capsule Commonly known as: PROZAC  Take 1 capsule (40 mg total) by mouth every morning.   ipratropium 0.06 % nasal spray Commonly known as: ATROVENT  Place 1-2 sprays into both nostrils 2 (two) times daily as needed for runny nose.   losartan  25 MG tablet Commonly known as: COZAAR  Take 1 tablet (25 mg total) by mouth in the morning and at bedtime.   montelukast  10 MG tablet Commonly known as: SINGULAIR  Take 0.5 tablets (5 mg total) by mouth  daily.   omeprazole  20 MG capsule Commonly known as: PRILOSEC Take 1 capsule (20 mg total) by mouth 2 (two) times daily before a meal   ondansetron  4 MG tablet Commonly known as: Zofran  Take 1 tablet (4 mg total) by mouth every 8 (eight) hours as needed for nausea or vomiting.   polyethylene glycol 17 g packet Commonly known as: MIRALAX  / GLYCOLAX  Take 17 g by mouth 2 (two) times daily.   QUEtiapine  25 MG tablet Commonly known as: SEROQUEL  Take 1 tablet (25 mg total) by mouth at bedtime.   tamsulosin  0.4 MG Caps capsule Commonly known as: FLOMAX  Take 1 capsule (0.4 mg total) by mouth daily. Start taking on: June 29, 2024   Vitamin D  (Ergocalciferol ) 1.25 MG (50000 UNIT) Caps capsule Commonly known as: DRISDOL  Take 1 capsule (50,000 Units total) by mouth every 7 (seven) days.        Contact information for follow-up providers     Brooke Hadassah SQUIBB, MD Follow up.   Specialty: Family Medicine Why: hospital follow up Contact information: [address] [place] [address] [phone]              Contact information for after-discharge care     Destination     [facility] .   Service: Skilled Nursing Contact information: [address] [place]  [address] [phone]                    Discharge Exam: Brooke [name]   06/26/24 0935 06/28/24 0500  Weight: 53 kg 45.7 kg   General.  Frail and malnourished elderly lady, in no acute distress. Pulmonary.  Lungs clear bilaterally, normal respiratory effort. CV.  Regular rate and rhythm, no JVD, rub or murmur. Abdomen.  Soft, nontender, nondistended, BS positive. CNS.  Alert and oriented .  No focal neurologic deficit. Extremities.  No edema,  pulses intact and symmetrical.  Condition at discharge: stable  The results of significant diagnostics from this hospitalization (including imaging, microbiology, ancillary and laboratory) are listed below for  reference.   Imaging Studies: US  Abdomen Limited RUQ (LIVER/GB) Result Date: 06/27/2024 EXAM: Right Upper Quadrant Abdominal Ultrasound 06/27/2024 06:07:07 PM TECHNIQUE: Real-time ultrasonography of the right upper quadrant of the abdomen was performed. COMPARISON: None available. CLINICAL HISTORY: Pain. FINDINGS: LIVER: The liver demonstrates normal echogenicity. No intrahepatic biliary ductal dilatation. No evidence of mass. BILIARY SYSTEM: Gallbladder wall thickness measures 2.4 mm. No pericholecystic fluid. No cholelithiasis. Common bile duct is within normal limits measuring 1 mm. OTHER: No right upper quadrant ascites. IMPRESSION: 1. No acute findings. Electronically signed by: Franky Crease MD 06/27/2024 08:31 PM EST RP Workstation: [id number]   CT Head Wo Contrast Result Date: 06/26/2024 EXAM: CT HEAD WITHOUT CONTRAST 06/26/2024 12:10:56 PM TECHNIQUE: CT of the head was performed without the administration of intravenous contrast. Automated exposure control, iterative reconstruction, and/or weight based adjustment of the mA/kV was  utilized to reduce the radiation dose to as low as reasonably achievable. COMPARISON: 05/10/2024 CLINICAL HISTORY: Mental status change, unknown cause FINDINGS: BRAIN AND VENTRICLES: No acute hemorrhage. Patchy and confluent decreased attenuation throughout deep and periventricular white matter bilaterally, compatible with chronic microvascular ischemic disease. Chronic bilateral basal ganglia lacunar infarcts. Cerebral ventricle sizes concordant with cerebral volume loss. No extra-axial collection. No mass effect or midline shift. There is tortuosity of the intracranial vertebral arteries and basilar artery with prominent atherosclerosis particularly of the left vertebral artery and the mid basilar artery. 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 There is prominent calcification in the right Cerebellopontine Angle Cistern likely related to atherosclerosis  of the basilar artery. There are no findings to suggest extra-axial mass in this region. ORBITS: Bilateral lens replacement. SINUSES: Left sphenoid sinus mucosal thickening. Mild partial bilateral mastoid effusions. SOFT TISSUES AND SKULL: No acute soft tissue abnormality. No skull fracture. Postsurgical changes of right Retromastoid Craniotomy. IMPRESSION: 1. No acute intracranial abnormality. 2. 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 3. 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 4. Cerebral volume loss. Electronically signed by: Donnice Mania MD 06/26/2024 12:53 PM EST RP Workstation: [id number]   DG Chest Port 1 View Result Date: 06/26/2024 EXAM: 1 VIEW(S) XRAY OF THE CHEST 06/26/2024 09:42:11 AM COMPARISON: 10/16/2023 CLINICAL HISTORY: 78 year old female. Weakness. FINDINGS: LUNGS AND PLEURA: Unchanged elevation of the left hemidiaphragm. No focal pulmonary opacity. No pleural effusion. No pneumothorax. HEART AND MEDIASTINUM: Stable cardiomegaly and tortuosity of the thoracic aorta. BONES AND SOFT TISSUES: No acute osseous abnormality. IMPRESSION: 1. No acute cardiopulmonary abnormality. Electronically signed by: Helayne Hurst MD 06/26/2024 10:00 AM EST RP Workstation: [id number]    Microbiology: Results for orders placed or performed during the hospital encounter of 06/26/24  Resp panel by RT-PCR (RSV, Flu A&B, Covid) Anterior Nasal Swab     Status: None   Collection Time: 06/26/24  9:38 AM   Specimen: Anterior Nasal Swab  Result Value Ref Range Status   SARS Coronavirus 2 by RT PCR NEGATIVE NEGATIVE Final    Comment: (NOTE) SARS-CoV-2 target nucleic acids are NOT DETECTED.  The SARS-CoV-2 RNA is generally detectable in upper respiratory specimens during the acute phase of infection. The lowest concentration of SARS-CoV-2 viral copies this assay can detect  is 138 copies/mL. A negative result does not preclude SARS-Cov-2 infection and should not be used as the sole basis for treatment or other patient management decisions. A negative result may occur with  improper specimen collection/handling, submission of specimen other than nasopharyngeal swab, presence of viral mutation(s) within the areas targeted by this assay, and inadequate number of viral copies(<138 copies/mL). A negative result must be combined with clinical observations, patient history, and epidemiological information. The expected result is Negative.  Fact Sheet for Patients:  bloggercourse.com  Fact Sheet for Healthcare Providers:  seriousbroker.it  This test is no t yet approved or cleared by the [place]  FDA and  has been authorized for detection and/or diagnosis of SARS-CoV-2 by FDA under an Emergency Use Authorization (EUA). This EUA will remain  in effect (meaning this test can be used) for the duration of the COVID-19 declaration under Section 564(b)(1) of the Act, 21 U.S.C.section 360bbb-3(b)(1), unless the authorization is terminated  or revoked sooner.       Influenza A by PCR NEGATIVE NEGATIVE Final   Influenza B by PCR NEGATIVE NEGATIVE Final    Comment: (  NOTE) The Xpert Xpress SARS-CoV-2/FLU/RSV plus assay is intended as an aid in the diagnosis of influenza from Nasopharyngeal swab specimens and should not be used as a sole basis for treatment. Nasal washings and aspirates are unacceptable for Xpert Xpress SARS-CoV-2/FLU/RSV testing.  Fact Sheet for Patients: bloggercourse.com  Fact Sheet for Healthcare Providers: seriousbroker.it  This test is not yet approved or cleared by the [place]  FDA and has been authorized for detection and/or diagnosis of SARS-CoV-2 by FDA under an Emergency Use Authorization (EUA). This EUA will remain in effect  (meaning this test can be used) for the duration of the COVID-19 declaration under Section 564(b)(1) of the Act, 21 U.S.C. section 360bbb-3(b)(1), unless the authorization is terminated or revoked.     Resp Syncytial Virus by PCR NEGATIVE NEGATIVE Final    Comment: (NOTE) Fact Sheet for Patients: bloggercourse.com  Fact Sheet for Healthcare Providers: seriousbroker.it  This test is not yet approved or cleared by the [place]  FDA and has been authorized for detection and/or diagnosis of SARS-CoV-2 by FDA under an Emergency Use Authorization (EUA). This EUA will remain in effect (meaning this test can be used) for the duration of the COVID-19 declaration under Section 564(b)(1) of the Act, 21 U.S.C. section 360bbb-3(b)(1), unless the authorization is terminated or revoked.  Performed at [facility], [address] [place], [place] [address]   Urine Culture (for pregnant, neutropenic or urologic patients or patients with an indwelling urinary catheter)     Status: Abnormal   Collection Time: 06/26/24 10:04 AM   Specimen: Urine, Clean Catch  Result Value Ref Range Status   Specimen Description   Final    URINE, CLEAN CATCH Performed at [facility], [address], [place], [place] [address]    Special Requests   Final    NONE Performed at [facility], [address], [place], [place] [address]    Culture 40,000 COLONIES/mL PSEUDOMONAS AERUGINOSA (A)  Final   Report Status 06/28/2024 FINAL  Final   Organism ID, Bacteria PSEUDOMONAS AERUGINOSA (A)  Final      Susceptibility   Pseudomonas aeruginosa - MIC*    MEROPENEM  <=0.25 SENSITIVE Sensitive     CIPROFLOXACIN  >=4 RESISTANT Resistant     PIP/TAZO Value in next row Sensitive      <=4 SENSITIVEThis is a modified FDA-approved test that has been validated and its performance characteristics determined by the reporting laboratory.  This  laboratory is certified under the Clinical Laboratory Improvement Amendments CLIA as qualified to perform high complexity clinical laboratory testing.    CEFEPIME  Value in next row Sensitive      <=4 SENSITIVEThis is a modified FDA-approved test that has been validated and its performance characteristics determined by the reporting laboratory.  This laboratory is certified under the Clinical Laboratory Improvement Amendments CLIA as qualified to perform high complexity clinical laboratory testing.    CEFTAZIDIME Value in next row Sensitive      <=4 SENSITIVEThis is a modified FDA-approved test that has been validated and its performance characteristics determined by the reporting laboratory.  This laboratory is certified under the Clinical Laboratory Improvement Amendments CLIA as qualified to perform high complexity clinical laboratory testing.    * 40,000 COLONIES/mL PSEUDOMONAS AERUGINOSA    Labs: CBC: Recent Labs  Lab 06/26/24 0938 06/27/24 0456 06/28/24 0408  WBC 5.9 5.1 5.7  NEUTROABS 4.7  --   --   HGB 11.8* 10.5* 10.2*  HCT 33.9* 31.1* 30.6*  MCV 85.0 87.6  87.9  PLT 259 208 226   Basic Metabolic Panel: Recent Labs  Lab 06/26/24 0938 06/26/24 1400 06/27/24 0009 06/27/24 0456 06/27/24 0556 06/28/24 0408  NA 124* 125* 129* 131* 132* 132*  K 3.9  --   --  4.0  --  4.1  CL 91*  --   --  100  --  99  CO2 24  --   --  25  --  26  GLUCOSE 108*  --   --  84  --  83  BUN 6*  --   --  6*  --  7*  CREATININE 0.64  --   --  0.69  --  0.79  CALCIUM  8.8*  --   --  8.5*  --  8.5*  MG  --  1.9  --   --   --   --    Liver Function Tests: Recent Labs  Lab 06/26/24 0938  AST 23  ALT 13  ALKPHOS 72  BILITOT 0.6  PROT 6.4*  ALBUMIN 3.9   CBG: Recent Labs  Lab 06/27/24 0809 06/28/24 0757 06/28/24 1211  GLUCAP 97 93 154*    Discharge time spent: greater than 30 minutes.  This record has been created using Conservation officer, historic buildings. Errors have been sought and  corrected,but may not always be located. Such creation errors do not reflect on the standard of care.   Signed: Amaryllis Dare, MD [facility] 06/28/2024

## 2024-06-28 NOTE — TOC Progression Note (Addendum)
 Transition of Care [facility]) - Progression Note    Patient Details  Name: Brooke [name] MRN: [id number] Date of Birth: [birth year]  [facility] CM/SW Contact  Dalia GORMAN Fuse, RN Phone Number: 06/28/2024, 10:17 AM  Clinical Narrative:     The patient has a bed offer from [facility]. TOC spoke with the patients daughter and she would like to accept the offer. She advised that it would be good for her mom to have some company, so she doesn't have to have a private room. TOC selected the facility in the hub and left message with Darien at the facility to make her aware. Nitchia to start ins auth.  Approved PlanAuthID: [id number] Dates: 11/26-11/28/2025 Next Review Date: 06/30/2024   Expected Discharge Plan: Skilled Nursing Facility Barriers to Discharge: Continued Medical Work up               Expected Discharge Plan and Services   Discharge Planning Services: CM Consult Post Acute Care Choice: Skilled Nursing Facility Living arrangements for the past 2 months: Single Family Home                                       Social Drivers of Health (SDOH) Interventions SDOH Screenings   Food Insecurity: No Food Insecurity (06/26/2024)  Housing: Low Risk  (06/26/2024)  Transportation Needs: No Transportation Needs (06/26/2024)  Utilities: Not At Risk (05/11/2024)  Alcohol Screen: Low Risk  (06/02/2023)  Depression (PHQ2-9): High Risk (03/31/2024)  Financial Resource Strain: Low Risk  (06/22/2024)  Physical Activity: Inactive (06/22/2024)  Social Connections: Socially Isolated (06/26/2024)  Stress: Stress Concern Present (06/22/2024)  Tobacco Use: Low Risk  (06/28/2024)  Health Literacy: Adequate Health Literacy (06/02/2023)    Readmission Risk Interventions     No data to display

## 2024-06-28 NOTE — TOC Transition Note (Signed)
 Transition of Care [facility]) - Discharge Note   Patient Details  Name: Brooke [name] MRN: [id number] Date of Birth: [birth year]  Transition of Care [facility]) CM/SW Contact:  Dalia GORMAN Fuse, RN Phone Number: 06/28/2024, 2:26 PM   Clinical Narrative:     Patient is medically clear to discharge to [facility] for STR. The patient's daughter is in agreement with the discharge plan. No other TOC needs.  Nurse to Call report to [facility] [phone]. RM [phone]   Final next level of care: Skilled Nursing Facility Barriers to Discharge: Barriers Resolved   Patient Goals and CMS Choice     Choice offered to / list presented to : Adult Children      Discharge Placement              Patient chooses bed at: [facility] Patient to be transferred to facility by: [facility] Name of family member notified: Tacey Palma (Daughter)  [phone] Patient and family notified of of transfer: 06/28/24  Discharge Plan and Services Additional resources added to the After Visit Summary for     Discharge Planning Services: CM Consult Post Acute Care Choice: Skilled Nursing Facility                               Social Drivers of Health (SDOH) Interventions SDOH Screenings   Food Insecurity: No Food Insecurity (06/26/2024)  Housing: Low Risk  (06/26/2024)  Transportation Needs: No Transportation Needs (06/26/2024)  Utilities: Not At Risk (05/11/2024)  Alcohol Screen: Low Risk  (06/02/2023)  Depression (PHQ2-9): High Risk (03/31/2024)  Financial Resource Strain: Low Risk  (06/22/2024)  Physical Activity: Inactive (06/22/2024)  Social Connections: Socially Isolated (06/26/2024)  Stress: Stress Concern Present (06/22/2024)  Tobacco Use: Low Risk  (06/28/2024)  Health Literacy: Adequate Health Literacy (06/02/2023)     Readmission Risk Interventions     No data to display

## 2024-06-28 NOTE — Plan of Care (Signed)

## 2024-06-28 NOTE — Plan of Care (Signed)
  Problem: Clinical Measurements: Goal: Will remain free from infection Outcome: Progressing   Problem: Activity: Goal: Risk for activity intolerance will decrease Outcome: Progressing   Problem: Coping: Goal: Level of anxiety will decrease Outcome: Progressing   Problem: Pain Managment: Goal: General experience of comfort will improve and/or be controlled Outcome: Progressing

## 2024-06-30 ENCOUNTER — Other Ambulatory Visit: Payer: Self-pay

## 2024-08-01 ENCOUNTER — Other Ambulatory Visit: Payer: Self-pay

## 2024-08-03 DEATH — deceased

## 2024-08-04 ENCOUNTER — Other Ambulatory Visit: Payer: Self-pay

## 2024-08-07 ENCOUNTER — Other Ambulatory Visit: Payer: Self-pay

## 2024-08-07 ENCOUNTER — Other Ambulatory Visit (HOSPITAL_COMMUNITY): Payer: Self-pay
